# Patient Record
Sex: Female | Born: 1940 | Race: White | Hispanic: No | Smoking: Never smoker
Health system: Southern US, Community
[De-identification: ages and names within clinical notes are randomized; demographics above are authoritative.]

## PROBLEM LIST (undated history)

## (undated) DIAGNOSIS — F32A Depression, unspecified: Secondary | ICD-10-CM

## (undated) DIAGNOSIS — E7800 Pure hypercholesterolemia, unspecified: Secondary | ICD-10-CM

## (undated) DIAGNOSIS — M1990 Unspecified osteoarthritis, unspecified site: Secondary | ICD-10-CM

## (undated) DIAGNOSIS — G4730 Sleep apnea, unspecified: Secondary | ICD-10-CM

## (undated) DIAGNOSIS — G9332 Myalgic encephalomyelitis/chronic fatigue syndrome: Secondary | ICD-10-CM

## (undated) DIAGNOSIS — F419 Anxiety disorder, unspecified: Secondary | ICD-10-CM

## (undated) DIAGNOSIS — E669 Obesity, unspecified: Secondary | ICD-10-CM

## (undated) DIAGNOSIS — K219 Gastro-esophageal reflux disease without esophagitis: Secondary | ICD-10-CM

## (undated) DIAGNOSIS — J329 Chronic sinusitis, unspecified: Secondary | ICD-10-CM

## (undated) DIAGNOSIS — M797 Fibromyalgia: Secondary | ICD-10-CM

## (undated) DIAGNOSIS — R5382 Chronic fatigue, unspecified: Secondary | ICD-10-CM

## (undated) DIAGNOSIS — I82409 Acute embolism and thrombosis of unspecified deep veins of unspecified lower extremity: Secondary | ICD-10-CM

## (undated) DIAGNOSIS — I10 Essential (primary) hypertension: Secondary | ICD-10-CM

## (undated) DIAGNOSIS — R609 Edema, unspecified: Secondary | ICD-10-CM

## (undated) DIAGNOSIS — C569 Malignant neoplasm of unspecified ovary: Secondary | ICD-10-CM

## (undated) DIAGNOSIS — F329 Major depressive disorder, single episode, unspecified: Secondary | ICD-10-CM

## (undated) DIAGNOSIS — R600 Localized edema: Secondary | ICD-10-CM

## (undated) HISTORY — DX: Gastro-esophageal reflux disease without esophagitis: K21.9

## (undated) HISTORY — DX: Major depressive disorder, single episode, unspecified: F32.9

## (undated) HISTORY — DX: Essential (primary) hypertension: I10

## (undated) HISTORY — PX: ROTATOR CUFF REPAIR: SHX139

## (undated) HISTORY — DX: Malignant neoplasm of unspecified ovary: C56.9

## (undated) HISTORY — DX: Chronic sinusitis, unspecified: J32.9

## (undated) HISTORY — PX: REPLACEMENT TOTAL KNEE: SUR1224

## (undated) HISTORY — PX: BACK SURGERY: SHX140

## (undated) HISTORY — PX: CATARACT EXTRACTION W/ INTRAOCULAR LENS & ANTERIOR VITRECTOMY: SHX1308

## (undated) HISTORY — PX: ABDOMINAL HYSTERECTOMY: SHX81

## (undated) HISTORY — PX: TONSILLECTOMY: SUR1361

## (undated) HISTORY — DX: Sleep apnea, unspecified: G47.30

## (undated) HISTORY — PX: LUMBAR DISC SURGERY: SHX700

## (undated) HISTORY — DX: Obesity, unspecified: E66.9

## (undated) HISTORY — DX: Unspecified osteoarthritis, unspecified site: M19.90

## (undated) HISTORY — PX: VEIN LIGATION AND STRIPPING: SHX2653

---

## 1998-01-19 ENCOUNTER — Encounter: Admission: RE | Admit: 1998-01-19 | Discharge: 1998-04-19 | Payer: Self-pay | Admitting: Family Medicine

## 1998-10-07 ENCOUNTER — Encounter: Admission: RE | Admit: 1998-10-07 | Discharge: 1998-12-24 | Payer: Self-pay | Admitting: Anesthesiology

## 1998-11-20 ENCOUNTER — Encounter: Admission: RE | Admit: 1998-11-20 | Discharge: 1999-02-18 | Payer: Self-pay | Admitting: Family Medicine

## 1998-12-24 ENCOUNTER — Encounter: Admission: RE | Admit: 1998-12-24 | Discharge: 1999-03-24 | Payer: Self-pay | Admitting: Anesthesiology

## 1999-04-15 ENCOUNTER — Encounter: Admission: RE | Admit: 1999-04-15 | Discharge: 1999-07-14 | Payer: Self-pay | Admitting: Anesthesiology

## 1999-08-05 ENCOUNTER — Encounter: Admission: RE | Admit: 1999-08-05 | Discharge: 1999-09-30 | Payer: Self-pay | Admitting: Anesthesiology

## 1999-09-29 ENCOUNTER — Encounter: Payer: Self-pay | Admitting: Family Medicine

## 1999-09-29 ENCOUNTER — Ambulatory Visit (HOSPITAL_COMMUNITY): Admission: RE | Admit: 1999-09-29 | Discharge: 1999-09-29 | Payer: Self-pay | Admitting: Family Medicine

## 1999-09-30 ENCOUNTER — Encounter: Admission: RE | Admit: 1999-09-30 | Discharge: 1999-12-23 | Payer: Self-pay | Admitting: Anesthesiology

## 1999-12-23 ENCOUNTER — Encounter: Admission: RE | Admit: 1999-12-23 | Discharge: 1999-12-31 | Payer: Self-pay | Admitting: Anesthesiology

## 2000-02-10 ENCOUNTER — Ambulatory Visit (HOSPITAL_COMMUNITY): Admission: RE | Admit: 2000-02-10 | Discharge: 2000-02-10 | Payer: Self-pay | Admitting: Family Medicine

## 2000-02-17 ENCOUNTER — Encounter: Admission: RE | Admit: 2000-02-17 | Discharge: 2000-04-24 | Payer: Self-pay | Admitting: Anesthesiology

## 2000-06-07 ENCOUNTER — Encounter: Admission: RE | Admit: 2000-06-07 | Discharge: 2000-09-05 | Payer: Self-pay | Admitting: Anesthesiology

## 2000-07-06 ENCOUNTER — Ambulatory Visit (HOSPITAL_COMMUNITY): Admission: RE | Admit: 2000-07-06 | Discharge: 2000-07-06 | Payer: Self-pay | Admitting: Gastroenterology

## 2000-09-28 ENCOUNTER — Encounter: Admission: RE | Admit: 2000-09-28 | Discharge: 2000-12-27 | Payer: Self-pay | Admitting: Anesthesiology

## 2001-01-24 ENCOUNTER — Encounter: Admission: RE | Admit: 2001-01-24 | Discharge: 2001-04-24 | Payer: Self-pay | Admitting: Anesthesiology

## 2001-04-17 ENCOUNTER — Encounter: Payer: Self-pay | Admitting: Family Medicine

## 2001-04-17 ENCOUNTER — Ambulatory Visit (HOSPITAL_COMMUNITY): Admission: RE | Admit: 2001-04-17 | Discharge: 2001-04-17 | Payer: Self-pay | Admitting: Family Medicine

## 2001-04-30 ENCOUNTER — Encounter: Payer: Self-pay | Admitting: Family Medicine

## 2001-04-30 ENCOUNTER — Encounter: Admission: RE | Admit: 2001-04-30 | Discharge: 2001-04-30 | Payer: Self-pay | Admitting: Family Medicine

## 2001-05-14 ENCOUNTER — Encounter: Admission: RE | Admit: 2001-05-14 | Discharge: 2001-06-09 | Payer: Self-pay | Admitting: Anesthesiology

## 2002-02-18 ENCOUNTER — Encounter: Payer: Self-pay | Admitting: Family Medicine

## 2002-02-18 ENCOUNTER — Encounter: Admission: RE | Admit: 2002-02-18 | Discharge: 2002-02-18 | Payer: Self-pay | Admitting: Family Medicine

## 2002-07-29 ENCOUNTER — Ambulatory Visit (HOSPITAL_COMMUNITY): Admission: RE | Admit: 2002-07-29 | Discharge: 2002-07-29 | Payer: Self-pay | Admitting: Family Medicine

## 2002-07-29 ENCOUNTER — Encounter: Payer: Self-pay | Admitting: Family Medicine

## 2002-08-05 ENCOUNTER — Encounter: Payer: Self-pay | Admitting: Specialist

## 2002-08-12 ENCOUNTER — Inpatient Hospital Stay (HOSPITAL_COMMUNITY): Admission: RE | Admit: 2002-08-12 | Discharge: 2002-08-17 | Payer: Self-pay | Admitting: Specialist

## 2002-08-12 ENCOUNTER — Encounter: Payer: Self-pay | Admitting: Specialist

## 2002-09-17 ENCOUNTER — Encounter (HOSPITAL_COMMUNITY): Admission: RE | Admit: 2002-09-17 | Discharge: 2002-10-17 | Payer: Self-pay | Admitting: Specialist

## 2002-10-22 ENCOUNTER — Encounter (HOSPITAL_COMMUNITY): Admission: RE | Admit: 2002-10-22 | Discharge: 2002-11-21 | Payer: Self-pay | Admitting: Specialist

## 2003-07-31 ENCOUNTER — Encounter: Payer: Self-pay | Admitting: Family Medicine

## 2003-07-31 ENCOUNTER — Ambulatory Visit (HOSPITAL_COMMUNITY): Admission: RE | Admit: 2003-07-31 | Discharge: 2003-07-31 | Payer: Self-pay | Admitting: Family Medicine

## 2003-08-22 ENCOUNTER — Ambulatory Visit (HOSPITAL_COMMUNITY): Admission: RE | Admit: 2003-08-22 | Discharge: 2003-08-22 | Payer: Self-pay | Admitting: Orthopedic Surgery

## 2003-09-24 ENCOUNTER — Observation Stay (HOSPITAL_COMMUNITY): Admission: RE | Admit: 2003-09-24 | Discharge: 2003-09-25 | Payer: Self-pay | Admitting: Orthopedic Surgery

## 2003-10-01 ENCOUNTER — Encounter (HOSPITAL_COMMUNITY): Admission: RE | Admit: 2003-10-01 | Discharge: 2003-10-31 | Payer: Self-pay | Admitting: Orthopedic Surgery

## 2003-10-13 ENCOUNTER — Encounter (HOSPITAL_COMMUNITY): Admission: RE | Admit: 2003-10-13 | Discharge: 2003-11-12 | Payer: Self-pay | Admitting: Orthopedic Surgery

## 2003-11-14 ENCOUNTER — Encounter (HOSPITAL_COMMUNITY): Admission: RE | Admit: 2003-11-14 | Discharge: 2003-12-19 | Payer: Self-pay | Admitting: Orthopedic Surgery

## 2004-02-12 ENCOUNTER — Encounter (INDEPENDENT_AMBULATORY_CARE_PROVIDER_SITE_OTHER): Payer: Self-pay | Admitting: *Deleted

## 2004-02-12 ENCOUNTER — Ambulatory Visit (HOSPITAL_COMMUNITY): Admission: RE | Admit: 2004-02-12 | Discharge: 2004-02-12 | Payer: Self-pay | Admitting: Surgery

## 2004-05-18 ENCOUNTER — Encounter: Admission: RE | Admit: 2004-05-18 | Discharge: 2004-05-18 | Payer: Self-pay | Admitting: Orthopedic Surgery

## 2004-08-09 ENCOUNTER — Ambulatory Visit (HOSPITAL_COMMUNITY): Admission: RE | Admit: 2004-08-09 | Discharge: 2004-08-09 | Payer: Self-pay | Admitting: Family Medicine

## 2005-08-10 ENCOUNTER — Ambulatory Visit (HOSPITAL_COMMUNITY): Admission: RE | Admit: 2005-08-10 | Discharge: 2005-08-10 | Payer: Self-pay | Admitting: *Deleted

## 2005-09-19 ENCOUNTER — Encounter (INDEPENDENT_AMBULATORY_CARE_PROVIDER_SITE_OTHER): Payer: Self-pay | Admitting: *Deleted

## 2005-09-19 ENCOUNTER — Encounter: Admission: RE | Admit: 2005-09-19 | Discharge: 2005-09-19 | Payer: Self-pay | Admitting: Obstetrics and Gynecology

## 2005-09-19 ENCOUNTER — Other Ambulatory Visit: Admission: RE | Admit: 2005-09-19 | Discharge: 2005-09-19 | Payer: Self-pay | Admitting: Interventional Radiology

## 2006-08-11 ENCOUNTER — Ambulatory Visit (HOSPITAL_COMMUNITY): Admission: RE | Admit: 2006-08-11 | Discharge: 2006-08-11 | Payer: Self-pay | Admitting: Family Medicine

## 2007-08-13 ENCOUNTER — Ambulatory Visit (HOSPITAL_COMMUNITY): Admission: RE | Admit: 2007-08-13 | Discharge: 2007-08-13 | Payer: Self-pay | Admitting: Family Medicine

## 2008-06-05 ENCOUNTER — Encounter: Admission: RE | Admit: 2008-06-05 | Discharge: 2008-06-05 | Payer: Self-pay | Admitting: Surgery

## 2009-05-11 ENCOUNTER — Inpatient Hospital Stay (HOSPITAL_COMMUNITY): Admission: RE | Admit: 2009-05-11 | Discharge: 2009-05-14 | Payer: Self-pay | Admitting: Orthopedic Surgery

## 2009-05-20 ENCOUNTER — Encounter (INDEPENDENT_AMBULATORY_CARE_PROVIDER_SITE_OTHER): Payer: Self-pay | Admitting: Orthopedic Surgery

## 2009-05-20 ENCOUNTER — Ambulatory Visit: Payer: Self-pay | Admitting: Vascular Surgery

## 2009-05-20 ENCOUNTER — Ambulatory Visit (HOSPITAL_COMMUNITY): Admission: RE | Admit: 2009-05-20 | Discharge: 2009-05-20 | Payer: Self-pay | Admitting: Orthopedic Surgery

## 2009-06-10 ENCOUNTER — Encounter (HOSPITAL_COMMUNITY): Admission: RE | Admit: 2009-06-10 | Discharge: 2009-07-09 | Payer: Self-pay | Admitting: Orthopedic Surgery

## 2009-07-10 ENCOUNTER — Encounter (HOSPITAL_COMMUNITY): Admission: RE | Admit: 2009-07-10 | Discharge: 2009-08-09 | Payer: Self-pay | Admitting: Orthopedic Surgery

## 2009-07-29 ENCOUNTER — Encounter (HOSPITAL_COMMUNITY)
Admission: RE | Admit: 2009-07-29 | Discharge: 2009-08-28 | Payer: Self-pay | Admitting: Physical Medicine and Rehabilitation

## 2009-08-16 ENCOUNTER — Encounter
Admission: RE | Admit: 2009-08-16 | Discharge: 2009-08-16 | Payer: Self-pay | Admitting: Physical Medicine and Rehabilitation

## 2009-09-01 ENCOUNTER — Encounter (HOSPITAL_COMMUNITY)
Admission: RE | Admit: 2009-09-01 | Discharge: 2009-10-01 | Payer: Self-pay | Admitting: Physical Medicine and Rehabilitation

## 2010-02-22 ENCOUNTER — Ambulatory Visit (HOSPITAL_COMMUNITY): Admission: RE | Admit: 2010-02-22 | Discharge: 2010-02-22 | Payer: Self-pay | Admitting: Psychiatry

## 2010-07-20 ENCOUNTER — Ambulatory Visit (HOSPITAL_COMMUNITY): Admission: RE | Admit: 2010-07-20 | Discharge: 2010-07-20 | Payer: Self-pay | Admitting: Orthopedic Surgery

## 2010-08-03 ENCOUNTER — Emergency Department (HOSPITAL_COMMUNITY): Admission: EM | Admit: 2010-08-03 | Discharge: 2010-08-03 | Payer: Self-pay | Admitting: Emergency Medicine

## 2010-09-24 ENCOUNTER — Inpatient Hospital Stay (HOSPITAL_COMMUNITY)
Admission: RE | Admit: 2010-09-24 | Discharge: 2010-09-27 | Payer: Self-pay | Source: Home / Self Care | Attending: Orthopedic Surgery | Admitting: Orthopedic Surgery

## 2010-10-30 ENCOUNTER — Encounter: Payer: Self-pay | Admitting: Surgery

## 2010-10-31 ENCOUNTER — Encounter: Payer: Self-pay | Admitting: Family Medicine

## 2010-12-20 LAB — CBC
HCT: 27.8 % — ABNORMAL LOW (ref 36.0–46.0)
HCT: 31.1 % — ABNORMAL LOW (ref 36.0–46.0)
HCT: 33.7 % — ABNORMAL LOW (ref 36.0–46.0)
Hemoglobin: 10 g/dL — ABNORMAL LOW (ref 12.0–15.0)
Hemoglobin: 10.8 g/dL — ABNORMAL LOW (ref 12.0–15.0)
Hemoglobin: 9.1 g/dL — ABNORMAL LOW (ref 12.0–15.0)
MCH: 28.6 pg (ref 26.0–34.0)
MCH: 28.9 pg (ref 26.0–34.0)
MCH: 29.3 pg (ref 26.0–34.0)
MCHC: 32 g/dL (ref 30.0–36.0)
MCHC: 32.2 g/dL (ref 30.0–36.0)
MCHC: 32.7 g/dL (ref 30.0–36.0)
MCV: 89.2 fL (ref 78.0–100.0)
MCV: 89.4 fL (ref 78.0–100.0)
MCV: 89.9 fL (ref 78.0–100.0)
Platelets: 125 10*3/uL — ABNORMAL LOW (ref 150–400)
Platelets: 130 10*3/uL — ABNORMAL LOW (ref 150–400)
Platelets: 172 10*3/uL (ref 150–400)
RBC: 3.11 MIL/uL — ABNORMAL LOW (ref 3.87–5.11)
RBC: 3.46 MIL/uL — ABNORMAL LOW (ref 3.87–5.11)
RBC: 3.78 MIL/uL — ABNORMAL LOW (ref 3.87–5.11)
RDW: 13.7 % (ref 11.5–15.5)
RDW: 13.8 % (ref 11.5–15.5)
RDW: 13.9 % (ref 11.5–15.5)
WBC: 10.9 10*3/uL — ABNORMAL HIGH (ref 4.0–10.5)
WBC: 11.2 10*3/uL — ABNORMAL HIGH (ref 4.0–10.5)
WBC: 14.8 10*3/uL — ABNORMAL HIGH (ref 4.0–10.5)

## 2010-12-20 LAB — TYPE AND SCREEN
ABO/RH(D): O POS
Antibody Screen: NEGATIVE

## 2010-12-20 LAB — BASIC METABOLIC PANEL
BUN: 10 mg/dL (ref 6–23)
BUN: 5 mg/dL — ABNORMAL LOW (ref 6–23)
CO2: 30 mEq/L (ref 19–32)
CO2: 30 mEq/L (ref 19–32)
Calcium: 8.4 mg/dL (ref 8.4–10.5)
Calcium: 8.7 mg/dL (ref 8.4–10.5)
Chloride: 99 mEq/L (ref 96–112)
Chloride: 99 mEq/L (ref 96–112)
Creatinine, Ser: 0.62 mg/dL (ref 0.4–1.2)
Creatinine, Ser: 0.63 mg/dL (ref 0.4–1.2)
GFR calc Af Amer: >60 mL/min (ref >60)
GFR calc Af Amer: >60 mL/min (ref >60)
GFR calc non Af Amer: >60 mL/min (ref >60)
GFR calc non Af Amer: >60 mL/min (ref >60)
Glucose, Bld: 200 mg/dL — ABNORMAL HIGH (ref 70–99)
Glucose, Bld: 225 mg/dL — ABNORMAL HIGH (ref 70–99)
Potassium: 3.8 mEq/L (ref 3.5–5.1)
Potassium: 4.2 mEq/L (ref 3.5–5.1)
Sodium: 135 mEq/L (ref 135–145)
Sodium: 136 mEq/L (ref 135–145)

## 2010-12-20 LAB — GLUCOSE, CAPILLARY
Glucose-Capillary: 141 mg/dL — ABNORMAL HIGH (ref 70–99)
Glucose-Capillary: 169 mg/dL — ABNORMAL HIGH (ref 70–99)
Glucose-Capillary: 177 mg/dL — ABNORMAL HIGH (ref 70–99)
Glucose-Capillary: 180 mg/dL — ABNORMAL HIGH (ref 70–99)
Glucose-Capillary: 184 mg/dL — ABNORMAL HIGH (ref 70–99)
Glucose-Capillary: 189 mg/dL — ABNORMAL HIGH (ref 70–99)
Glucose-Capillary: 189 mg/dL — ABNORMAL HIGH (ref 70–99)
Glucose-Capillary: 194 mg/dL — ABNORMAL HIGH (ref 70–99)
Glucose-Capillary: 200 mg/dL — ABNORMAL HIGH (ref 70–99)
Glucose-Capillary: 205 mg/dL — ABNORMAL HIGH (ref 70–99)
Glucose-Capillary: 211 mg/dL — ABNORMAL HIGH (ref 70–99)
Glucose-Capillary: 219 mg/dL — ABNORMAL HIGH (ref 70–99)
Glucose-Capillary: 222 mg/dL — ABNORMAL HIGH (ref 70–99)
Glucose-Capillary: 224 mg/dL — ABNORMAL HIGH (ref 70–99)

## 2010-12-20 LAB — APTT: aPTT: 36 seconds (ref 24–37)

## 2010-12-20 LAB — PROTIME-INR
INR: 1.04 (ref 0.00–1.49)
INR: 1.19 (ref 0.00–1.49)
INR: 1.41 (ref 0.00–1.49)
INR: 1.73 — ABNORMAL HIGH (ref 0.00–1.49)
Prothrombin Time: 13.8 seconds (ref 11.6–15.2)
Prothrombin Time: 15.3 seconds — ABNORMAL HIGH (ref 11.6–15.2)
Prothrombin Time: 17.5 seconds — ABNORMAL HIGH (ref 11.6–15.2)
Prothrombin Time: 20.4 seconds — ABNORMAL HIGH (ref 11.6–15.2)

## 2010-12-21 LAB — CBC
HCT: 46.8 % — ABNORMAL HIGH (ref 36.0–46.0)
Hemoglobin: 15.1 g/dL — ABNORMAL HIGH (ref 12.0–15.0)
MCH: 29.1 pg (ref 26.0–34.0)
MCHC: 32.3 g/dL (ref 30.0–36.0)
MCV: 90.2 fL (ref 78.0–100.0)
Platelets: 218 10*3/uL (ref 150–400)
RBC: 5.19 MIL/uL — ABNORMAL HIGH (ref 3.87–5.11)
RDW: 13.6 % (ref 11.5–15.5)
WBC: 10 10*3/uL (ref 4.0–10.5)

## 2010-12-21 LAB — SURGICAL PCR SCREEN
MRSA, PCR: NEGATIVE
Staphylococcus aureus: NEGATIVE

## 2010-12-21 LAB — COMPREHENSIVE METABOLIC PANEL
ALT: 17 U/L (ref 0–35)
AST: 20 U/L (ref 0–37)
Albumin: 4.5 g/dL (ref 3.5–5.2)
Alkaline Phosphatase: 92 U/L (ref 39–117)
BUN: 12 mg/dL (ref 6–23)
CO2: 29 mEq/L (ref 19–32)
Calcium: 9.8 mg/dL (ref 8.4–10.5)
Chloride: 96 mEq/L (ref 96–112)
Creatinine, Ser: 0.69 mg/dL (ref 0.4–1.2)
GFR calc Af Amer: >60 mL/min (ref >60)
GFR calc non Af Amer: >60 mL/min (ref >60)
Glucose, Bld: 144 mg/dL — ABNORMAL HIGH (ref 70–99)
Potassium: 4.7 mEq/L (ref 3.5–5.1)
Sodium: 141 mEq/L (ref 135–145)
Total Bilirubin: 0.8 mg/dL (ref 0.3–1.2)
Total Protein: 7.5 g/dL (ref 6.0–8.3)

## 2010-12-21 LAB — URINALYSIS, ROUTINE W REFLEX MICROSCOPIC
Bilirubin Urine: NEGATIVE
Glucose, UA: NEGATIVE mg/dL
Hgb urine dipstick: NEGATIVE
Ketones, ur: NEGATIVE mg/dL
Nitrite: NEGATIVE
Protein, ur: NEGATIVE mg/dL
Specific Gravity, Urine: 1.03 (ref 1.005–1.030)
Urobilinogen, UA: 0.2 mg/dL (ref 0.0–1.0)
pH: 6 (ref 5.0–8.0)

## 2010-12-21 LAB — PROTIME-INR
INR: 1.75 — ABNORMAL HIGH (ref 0.00–1.49)
Prothrombin Time: 20.6 seconds — ABNORMAL HIGH (ref 11.6–15.2)

## 2010-12-21 LAB — APTT: aPTT: 39 seconds — ABNORMAL HIGH (ref 24–37)

## 2011-01-16 LAB — GLUCOSE, CAPILLARY
Glucose-Capillary: 107 mg/dL — ABNORMAL HIGH (ref 70–99)
Glucose-Capillary: 117 mg/dL — ABNORMAL HIGH (ref 70–99)
Glucose-Capillary: 151 mg/dL — ABNORMAL HIGH (ref 70–99)
Glucose-Capillary: 153 mg/dL — ABNORMAL HIGH (ref 70–99)
Glucose-Capillary: 155 mg/dL — ABNORMAL HIGH (ref 70–99)
Glucose-Capillary: 158 mg/dL — ABNORMAL HIGH (ref 70–99)
Glucose-Capillary: 164 mg/dL — ABNORMAL HIGH (ref 70–99)
Glucose-Capillary: 166 mg/dL — ABNORMAL HIGH (ref 70–99)
Glucose-Capillary: 176 mg/dL — ABNORMAL HIGH (ref 70–99)
Glucose-Capillary: 183 mg/dL — ABNORMAL HIGH (ref 70–99)
Glucose-Capillary: 183 mg/dL — ABNORMAL HIGH (ref 70–99)
Glucose-Capillary: 189 mg/dL — ABNORMAL HIGH (ref 70–99)
Glucose-Capillary: 194 mg/dL — ABNORMAL HIGH (ref 70–99)
Glucose-Capillary: 202 mg/dL — ABNORMAL HIGH (ref 70–99)
Glucose-Capillary: 209 mg/dL — ABNORMAL HIGH (ref 70–99)

## 2011-01-16 LAB — URINALYSIS, ROUTINE W REFLEX MICROSCOPIC
Bilirubin Urine: NEGATIVE
Glucose, UA: NEGATIVE mg/dL
Hgb urine dipstick: NEGATIVE
Ketones, ur: NEGATIVE mg/dL
Nitrite: NEGATIVE
Protein, ur: NEGATIVE mg/dL
Specific Gravity, Urine: 1.025 (ref 1.005–1.030)
Urobilinogen, UA: 0.2 mg/dL (ref 0.0–1.0)
pH: 5 (ref 5.0–8.0)

## 2011-01-16 LAB — COMPREHENSIVE METABOLIC PANEL
ALT: 15 U/L (ref 0–35)
AST: 16 U/L (ref 0–37)
Albumin: 4.2 g/dL (ref 3.5–5.2)
Alkaline Phosphatase: 70 U/L (ref 39–117)
BUN: 15 mg/dL (ref 6–23)
CO2: 31 mEq/L (ref 19–32)
Calcium: 9.7 mg/dL (ref 8.4–10.5)
Chloride: 105 mEq/L (ref 96–112)
Creatinine, Ser: 0.65 mg/dL (ref 0.4–1.2)
GFR calc Af Amer: >60 mL/min (ref >60)
GFR calc non Af Amer: >60 mL/min (ref >60)
Glucose, Bld: 120 mg/dL — ABNORMAL HIGH (ref 70–99)
Potassium: 3.9 mEq/L (ref 3.5–5.1)
Sodium: 143 mEq/L (ref 135–145)
Total Bilirubin: 0.6 mg/dL (ref 0.3–1.2)
Total Protein: 7 g/dL (ref 6.0–8.3)

## 2011-01-16 LAB — CBC
HCT: 29.5 % — ABNORMAL LOW (ref 36.0–46.0)
HCT: 33.2 % — ABNORMAL LOW (ref 36.0–46.0)
HCT: 33.7 % — ABNORMAL LOW (ref 36.0–46.0)
HCT: 41 % (ref 36.0–46.0)
Hemoglobin: 10.2 g/dL — ABNORMAL LOW (ref 12.0–15.0)
Hemoglobin: 11.2 g/dL — ABNORMAL LOW (ref 12.0–15.0)
Hemoglobin: 11.5 g/dL — ABNORMAL LOW (ref 12.0–15.0)
Hemoglobin: 13.9 g/dL (ref 12.0–15.0)
MCHC: 33.8 g/dL (ref 30.0–36.0)
MCHC: 33.8 g/dL (ref 30.0–36.0)
MCHC: 34 g/dL (ref 30.0–36.0)
MCHC: 34.6 g/dL (ref 30.0–36.0)
MCV: 86.5 fL (ref 78.0–100.0)
MCV: 86.8 fL (ref 78.0–100.0)
MCV: 87.1 fL (ref 78.0–100.0)
MCV: 87.8 fL (ref 78.0–100.0)
Platelets: 144 10*3/uL — ABNORMAL LOW (ref 150–400)
Platelets: 153 10*3/uL (ref 150–400)
Platelets: 185 10*3/uL (ref 150–400)
Platelets: 213 10*3/uL (ref 150–400)
RBC: 3.39 MIL/uL — ABNORMAL LOW (ref 3.87–5.11)
RBC: 3.78 MIL/uL — ABNORMAL LOW (ref 3.87–5.11)
RBC: 3.89 MIL/uL (ref 3.87–5.11)
RBC: 4.73 MIL/uL (ref 3.87–5.11)
RDW: 13.3 % (ref 11.5–15.5)
RDW: 13.6 % (ref 11.5–15.5)
RDW: 13.6 % (ref 11.5–15.5)
RDW: 13.8 % (ref 11.5–15.5)
WBC: 10.7 10*3/uL — ABNORMAL HIGH (ref 4.0–10.5)
WBC: 7.9 10*3/uL (ref 4.0–10.5)
WBC: 8.1 10*3/uL (ref 4.0–10.5)
WBC: 8.6 10*3/uL (ref 4.0–10.5)

## 2011-01-16 LAB — BASIC METABOLIC PANEL
BUN: 5 mg/dL — ABNORMAL LOW (ref 6–23)
BUN: 9 mg/dL (ref 6–23)
CO2: 32 mEq/L (ref 19–32)
CO2: 32 mEq/L (ref 19–32)
Calcium: 8.5 mg/dL (ref 8.4–10.5)
Calcium: 8.5 mg/dL (ref 8.4–10.5)
Chloride: 101 mEq/L (ref 96–112)
Chloride: 103 mEq/L (ref 96–112)
Creatinine, Ser: 0.57 mg/dL (ref 0.4–1.2)
Creatinine, Ser: 0.63 mg/dL (ref 0.4–1.2)
GFR calc Af Amer: >60 mL/min (ref >60)
GFR calc Af Amer: >60 mL/min (ref >60)
GFR calc non Af Amer: >60 mL/min (ref >60)
GFR calc non Af Amer: >60 mL/min (ref >60)
Glucose, Bld: 175 mg/dL — ABNORMAL HIGH (ref 70–99)
Glucose, Bld: 194 mg/dL — ABNORMAL HIGH (ref 70–99)
Potassium: 4.1 mEq/L (ref 3.5–5.1)
Potassium: 4.4 mEq/L (ref 3.5–5.1)
Sodium: 137 mEq/L (ref 135–145)
Sodium: 138 mEq/L (ref 135–145)

## 2011-01-16 LAB — URINE MICROSCOPIC-ADD ON

## 2011-01-16 LAB — TYPE AND SCREEN
ABO/RH(D): O POS
Antibody Screen: NEGATIVE

## 2011-01-16 LAB — PROTIME-INR
INR: 1 (ref 0.00–1.49)
INR: 1.1 (ref 0.00–1.49)
INR: 1.3 (ref 0.00–1.49)
INR: 1.5 (ref 0.00–1.49)
Prothrombin Time: 13.6 seconds (ref 11.6–15.2)
Prothrombin Time: 14 seconds (ref 11.6–15.2)
Prothrombin Time: 16.1 seconds — ABNORMAL HIGH (ref 11.6–15.2)
Prothrombin Time: 18.4 seconds — ABNORMAL HIGH (ref 11.6–15.2)

## 2011-01-16 LAB — ABO/RH: ABO/RH(D): O POS

## 2011-01-16 LAB — APTT: aPTT: 51 seconds — ABNORMAL HIGH (ref 24–37)

## 2011-02-22 NOTE — Op Note (Signed)
NAMESARH, KIRSCHENBAUM              ACCOUNT NO.:  [id number]   MEDICAL RECORD NO.:  [id number]          PATIENT TYPE:  INP   LOCATION:  0009                         FACILITY:  [facility]   PHYSICIAN:  Ollen Gross, M.D.    DATE OF BIRTH:  [birth year]   DATE OF PROCEDURE:  05/11/2009  DATE OF DISCHARGE:                               OPERATIVE REPORT   PROCEDURE:  Right total knee arthroplasty.   SURGEON:  Ollen Gross, M.D.   ASSISTANT:  Avel Peace, PA-C   ANESTHESIA:  General with postop Marcaine pain pump.   ESTIMATED BLOOD LOSS:  Minimal.   DRAINS:  None.   TOURNIQUET TIME:  35 minutes at 300 mmHg.   COMPLICATIONS:  None.   CONDITION:  Stable.   INDICATIONS:  [name] is a 70 year old female with end-stage  arthritis of the right knee with progressively worsening pain and  dysfunction.  She had extensive nonoperative management but now has  developed intractable worsening pain and presents for total knee  arthroplasty.   PROCEDURE IN DETAIL:  After successful administration of general  anesthetic, a tourniquet was placed on the right thigh and right lower  extremity, prepped and draped in the usual sterile fashion.  Extremities  wrapped in Esmarch, knee flexed, tourniquet inflated to 300 mmHg.  Midline incision made with a 10 blade through subcutaneous tissue to the  level of the extensor mechanism.  A fresh blade is used to make a medial  parapatellar arthrotomy.  Soft tissue of the proximal medial tibia  subperiosteally elevated to joint line with the knife into the  semimembranosus bursa with a Cobb elevator.  Soft tissue laterally is  elevated with attention being paid to avoid patellar tendon or tibial  tubercle.  Patella subluxed laterally, knee flexed 90 degrees, ACL and  PCL removed.  A drill was used to create a starting hole in the distal  femur and canal was thoroughly irrigated.  A 5-degree right valgus  alignment guide is placed referencing off posterior  condyles, rotations  marked and the block pinned to remove 10 mm off the distal femur.  Distal femoral resection is made with an oscillating saw.  Sizing blocks  placed and size 3 is most appropriate.  Rotations marked at the  epicondylar axis.  Size 3 cutting blocks placed and the anterior,  posterior and chamfer cuts made.   Tibia subluxed forward and the menisci removed.  Extramedullary tibial  alignment guide is placed, referencing proximally at the medial aspect  of the tibial tubercle and distally along the second metatarsal axis and  tibial crest.  Blocks pinned to remove about 4 mm off the more deficient  lateral side.  Tibial resection is made with an oscillating saw.  Size 3  is most appropriate tibial component and the proximal tibia prepared  with the modular drill and keel punch for the size 3.  Femoral  preparation is completed with the intercondylar cut.   Size 3 mobile bearing tibial trial, size 3 posterior stabilized femoral  trial and 10 mm posterior stabilized rotating platform insert trial are  placed.  Full  extensions achieved with excellent varus-valgus and  anterior-posterior balance throughout full range of motion.  Patella was  everted and thickness was measured to be 22 mm.  Freehand resection is  taken to 12 mm.  A 35 template is placed, lug holes were drilled, trial  patella was placed and it tracks normally.  Osteophytes removed off the  posterior femur with the trial in place.  All trials removed and the cut  bone surfaces prepared with pulsatile lavage.  Cement was mixed and once  ready for implantation, the size 3 mobile bearing tibial tray, size 3  posterior stabilized femur and 35 patella are cemented into place.  Patella was held with clamp.  Trial 10-mm inserts placed, knee held in  full extension and all extruded cement removed.  The cement fully  hardened, then the permanent 10 mm posterior stabilized rotating  platform insert is placed into the  tibial tray.  The wound is copiously  irrigated with saline solution and then the FloSeal injected in the  medial and lateral gutters and suprapatellar area.  Moist sponge is  placed and the tourniquet is released for total time of 35 minutes.  The  sponges held for 2 minutes and removed.  Minimal bleeding was  encountered.  Bleeding that is encountered stopped with electrocautery.  Wounds copiously irrigated with saline solution and then the arthrotomy  closed with interrupted #1 PDS.  Flexion against gravity 130 degrees.  Subcu was closed interrupted 2-0 Vicryl and subcuticular running 4-0  Monocryl.  Catheter for Marcaine pain pump was placed the pump  initiated.  Steri-Strips and a bulky sterile dressing were applied and  she is then placed into a knee immobilizer, awakened and transferred to  recovery in stable condition.      Ollen Gross, M.D.  Electronically Signed     FA/MEDQ  D:  05/11/2009  T:  05/11/2009  Job:  [id number]

## 2011-02-25 NOTE — Procedures (Signed)
[facility]  Patient:    Carolyn Lambert, Carolyn Lambert                     MRN: [id number] Proc. Date: 07/06/00 Adm. Date:  [id number] Attending:  Orland Mustard CC:         Delorse Lek, M.D.                           Procedure Report  PROCEDURE:  Colonoscopy.  MEDICATIONS:  Fentanyl 125 mcg, Versed 10 mg IV.  SCOPE:  Olympus adult video colonoscope.  INDICATIONS:  Rectal bleeding, vague right-sided pain.  DESCRIPTION OF PROCEDURE:  The procedure had been explained to the patient and consent obtained.  With the patient in the left lateral decubitus position a digital examination was performed, and the Olympus adult video colonoscope was inserted and advanced under direct visualization.  The prep was quite good and we were able to advance over to the cecum.  The right lower quadrant was transilluminated.  The ileocecal valve was seen and I could only enter it for a short distance.  The scope was withdrawn.  The cecum, ascending colon, hepatic flexure, transverse colon, splenic flexure, descending and sigmoid colon were seen well upon removal.  No polyps or other lesions seen.  No significant diverticular disease.  Internal hemorrhoids were seen in the rectum upon removal of the scope.  The scope was withdrawn.  The patient tolerated the procedure well and was maintained on low-flow oxygen and pulse oximetry.  Throughout the procedure there were no obvious problem.  ASSESSMENT:  Rectal bleeding probably due to internal hemorrhoids.  PLAN:  We will treat for hemorrhoids and see back in our office in four to six weeks. DD:  07/06/00 TD:  07/06/00 Job: [id number]

## 2011-02-25 NOTE — Discharge Summary (Signed)
NAME:  Carolyn Lambert, Carolyn Lambert                        ACCOUNT NO.:  [id number]   MEDICAL RECORD NO.:  [id number]                   PATIENT TYPE:  INP   LOCATION:  0473                                 FACILITY:  [facility]   PHYSICIAN:  Ronnell Guadalajara, M.D.                DATE OF BIRTH:  16-Jan-1941   DATE OF ADMISSION:  08/12/2002  DATE OF DISCHARGE:  08/17/2002                                 DISCHARGE SUMMARY   ADMISSION DIAGNOSES:  1. Osteoarthritis left knee.  2. Depression.  3. Anxiety.   DISCHARGE DIAGNOSES:  1. Osteoarthritis of the left knee status post left total knee arthroplasty.  2. Depression.  3. Anxiety.  4. Postoperative anemia.   PROCEDURE:  The patient was taken to the operating room on August 12, 2002  and underwent a left total knee replacement.  The surgeon was Ronnell Guadalajara, M.D., assistant was Marlowe Kays, M.D.  A Hemovac drain x1 was  placed at the time of surgery.  Surgery was done under general anesthesia.   CONSULTATIONS:  1. Carolyn Lambert, M.D. of physical medicine rehabilitation.  2. Physical therapy.  3. Occupational therapy.  4. Pharmacy for Coumadin control.  5. 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   BRIEF HISTORY:  The patient is a 70 year old female with a several-year  history of left knee pain.  She has complaint of her left knee giving away.  She complains of a considerable amount of pain in her left knee and has also  complained of pain at night.  Risks and benefits of the procedure have been  discussed with the patient and the patient wishes to proceed.   LABORATORY AND ACCESSORY DATA:  CBC on admission revealed a hemoglobin and  hematocrit of 14.3 and 41.9, white blood cell count 6.9, red blood cell  count 5.0.  Serial H&H were followed throughout her hospital stay.  Hemoglobin and hematocrit did decline to 7.7 and 22.3 on August 14, 2002.  At the time of discharge on August 17, 2002, hemoglobin and hematocrit were  stable at 9.1 and  26.4 status post packed red blood cell transfusions on  August 14, 2002.  Differential on admission all within normal limits.  Coagulation studies on admission showed PTT slightly high at 40.  PT/INR at  the time of discharge were 19.3 and 1.8 on Coumadin therapy.  Routine  chemistry on admission showed glucose high at 210.  Follow up chemistry on  November 3rd showed glucose high at 166.  Urinalysis on admission showed  appearance was cloudy, leukocyte esterase moderate, many epithelials and a  few bacteria.  The patient's blood type is O positive with antibody  negative.   EKG on admission revealed normal sinus rhythm with a normal ECG.  Preop knee  x-rays revealed marked degenerative changes as noted.  Revealed the left  knee with particular deformity in the medial compartment also the  patellofemoral articulation.  A preop chest x-ray revealed negative chest  for active disease.   HOSPITAL COURSE:  The patient was admitted to [facility] and taken  to the operating room.  She underwent the above-stated procedure without  complication.  The patient tolerated the procedure well and was allowed to  return to the recovery room and then the orthopedic floor in continued  postoperative care.  On postop day #1 on August 13, 2002, the patient was  complaining of pain and a respective hemoglobin and hematocrit of 9.4 and  27.2.  Hemovac was discontinued on this day.  The patient was to be seen by  physical therapy and occupational therapy.  On August 14, 2002 seen by  orthopedics, hemoglobin was 7.7.  The patient was transfused 2 units of  packed red blood cells and pain medicine was increased.  On August 15, 2002  postop day #3, was seen by orthopedics.  Hemoglobin 9.2 hematocrit 26.6.  The patient was seen by physical therapy.  Has a slight slow progression  with physical therapy but otherwise resting comfortably and had some trouble  with her foot staying in a CPM.  On  August 16, 2002 postop day #4, seen by  orthopedics, the patient was doing well, no complaint, was progressing with  physical therapy and occupational therapy.  Plan was discharge home the  following day.  On August 17, 2002 postop day #5, the patient was doing  well and was ready for discharge, and was discharged home on this day.   DISPOSITION:  The patient was discharged home on August 17, 2002.   DISCHARGE MEDICATIONS:  1. OxyContin.  2. Skelaxin.  3. Coumadin per pharmacy.  4. Keflex 500 mg t.i.d. x5 days.   DIET:  As tolerated.   ACTIVITY:  Total knee precautions.  [facility] for home care.  The patient was  partial weightbearing, 50% of body weight.   FOLLOW UP:  The patient is to follow up with Dr. Montez Morita two weeks from the  date of surgery.  She is to call for an appointment.   CONDITION ON DISCHARGE:  Stable, improved.     Carolyn Lambert, P.A.-C.                   Ronnell Guadalajara, M.D.    SW/MEDQ  D:  09/09/2002  T:  09/09/2002  Job:  [id number]

## 2011-02-25 NOTE — Consult Note (Signed)
[facility]  Patient:    Carolyn Lambert, Carolyn Lambert                     MRN: [id number] Proc. Date: 02/17/00 Adm. Date:  [id number] Attending:  Thyra Breed CC:         Delorse Lek, M.D., [facility]                          Consultation Report  FOLLOWUP EVALUATION:  The patient comes in for followup evaluation of her low back on the basis of degenerative disc disease.  Since her last evaluation, she has seen Dr. Montez Morita, who plans to scope her right knee and has told her that she ultimately will likely require knee replacements.  She continues on her OxyContin 40 mg twice a day and has developed more muscle spasms since going off the Baclofen and is interested in going back on this.  Her other medications include Zoloft, Xanax, Wellbutrin, Daypro, Premarin, aspirin and the OxyContin.  She has minimal side-effects to the OxyContin.  PHYSICAL EXAMINATION:  VITAL SIGNS:  Blood pressure 146/67, heart rate 76, respiratory rate 16, O2 saturations 97%, pain level 6/10, which is improved from last visit.  NEUROLOGIC:  Straight leg raise signs are negative.  She has genu valgus of the knees bilaterally and mild crepitus.  Deep tendon reflexes were symmetric in the lower extremities.  IMPRESSION: 1. Low back pain on the basis of degenerative disc disease. 2. Knee pain on the basis of osteoarthritis, for which she is seeing    Dr. Michael Litter. Montez Morita. 3. Other medical problems per Dr. Delorse Lek.  DISPOSITION: 1. Continue on OxyContin 40 mg one p.o. b.i.d., #60 with no refills. 2. Restart Baclofen 10 mg one-half tablet t.i.d. p.r.n., #50 with two    refills. 3. Patient was given Percocet 5/325 mg one to two p.o. q.6h. p.r.n. postop    pain for after her arthroscopic surgery. 4. Follow up with me in eight weeks.  She is to stop by in another three to    four weeks for another prescription for OxyContin. DD:  02/17/00 TD:  02/21/00 Job:  [id number]

## 2011-02-25 NOTE — H&P (Signed)
Carolyn Lambert, GROUNDS              ACCOUNT NO.:  [id number]   MEDICAL RECORD NO.:  [id number]        PATIENT TYPE:  LINP   LOCATION:                               FACILITY:  [facility]   PHYSICIAN:  Ollen Gross, M.D.    DATE OF BIRTH:  Mar 05, 1941   DATE OF ADMISSION:  05/11/2009  DATE OF DISCHARGE:                              HISTORY & PHYSICAL   CHIEF COMPLAINT:  Right knee pain.   HISTORY OF PRESENT ILLNESS:  The patient is a 70 year old female who has  been seen by Dr. Ollen Gross for worsening osteoarthritis in her right  knee. She has been receiving injections for several years for the pain.  Recently the injections have stopped giving her relief.  The patient has  had a left total knee arthroplasty in the past and states that her right  knee is now feeling as bad as the left knee did prior to surgery.  The  patient feels that this is an appropriate time for her to have the right  knee replaced.   ALLERGIES:  None.   CURRENT MEDICATIONS:  1. Metformin.  2. Zoloft.  3. Xanax.  4. Biotin.  5. ICAPS.  6. Vitamin D supplements.  7. Fish oil.  8. Glucosamine.  9. Aspirin.   PAST MEDICAL HISTORY:  1. Non-insulin-dependent diabetes mellitus.  2. Arthritis.  3. Anxiety.  4. Depression.  5. Varicose veins.   PAST SURGICAL HISTORY:  1. Left total knee arthroplasty.  2. Arthroscopic surgery on her right knee.  3. Right shoulder surgery.  4. Hysterectomy.  5. Tonsillectomy.   FAMILY HISTORY:  The patient's father passed of natural causes.  Unsure  of age.  The patient's mother passed of natural causes as well but did  have hypertension and diabetes.   SOCIAL HISTORY:  The patient is married.  The patient is a nonsmoker,  nondrinker.  The patient has 5 children.  The patient plans to go home  after surgery as her daughter is planning on staying with her.   REVIEW OF SYSTEMS:  CONSTITUTIONAL: Negative for fevers, negative for  chills, negative for weight change.   Positive for fatigue.  NEURO/HEENT:  Negative for headache, negative for blurred vision, negative for balance  problems.  RESPIRATORY:  Negative for shortness of breath.  Negative for  wheezing.  Negative for cough.  CARDIOVASCULAR: Negative for chest pain,  palpitations, or difficulty breathing.  GASTROINTESTINAL:  Negative for  nausea, vomiting, diarrhea.  Negative for blood in the stool.  GENITOURINARY:  Negative for painful urination, negative for frequent  urinary tract infections.  MUSCULOSKELETAL:  Positive for joint pain,  positive for joint swelling, both in her right knee.  Positive for back  pain and positive for morning stiffness in both her back and her right  knee.   PHYSICAL EXAMINATION:  VITAL SIGNS:  Pulse 68, respirations 18, blood  pressure 140/82.  GENERAL:  The patient is a 70 year old female. She is in no acute  distress.  The patient is alert and oriented x3 and she is a good  historian.  HEENT:  Unremarkable.  NECK:  Supple.  Full range of motion.  Negative for lymphadenopathy.  CHEST:  Breath sounds are clear and equal bilaterally without rales,  rhonchi or wheezes.  HEART:  Regular rate and rhythm without murmur, rub or gallop.  ABDOMEN:  Soft, nontender. Bowel sounds are present.  No guarding on  palpation.  EXTREMITIES:  Right knee is negative for effusion.  The patient does  have pain with range of motion of her right knee.  NEUROLOGICAL:  No neurological deficits appreciated.  PERIPHERAL VASCULAR:  Negative for carotid bruit.  Carotid pulses are 2+  bilaterally.  Radial pulses are 2+ bilaterally.  Dorsalis pedis pulses  are 2+ bilaterally.   IMPRESSION:  Osteoarthritis of the right knee.   PLAN:  The patient to undergo right knee arthroplasty at [facility].  This will be performed by Dr. Ollen Gross.      Rozell Searing, [facility]      Ollen Gross, M.D.  Electronically Signed    LD/MEDQ  D:  04/26/2009  T:  04/27/2009  Job:   [id number]

## 2012-02-03 ENCOUNTER — Emergency Department (INDEPENDENT_AMBULATORY_CARE_PROVIDER_SITE_OTHER)
Admission: EM | Admit: 2012-02-03 | Discharge: 2012-02-03 | Disposition: A | Discharge status: Home / Self Care | Payer: Medicare Other | Source: Home / Self Care | Attending: Emergency Medicine | Admitting: Emergency Medicine

## 2012-02-03 ENCOUNTER — Encounter (HOSPITAL_COMMUNITY): Payer: Self-pay

## 2012-02-03 DIAGNOSIS — J329 Chronic sinusitis, unspecified: Secondary | ICD-10-CM

## 2012-02-03 HISTORY — DX: Chronic fatigue, unspecified: R53.82

## 2012-02-03 HISTORY — DX: Localized edema: R60.0

## 2012-02-03 HISTORY — DX: Fibromyalgia: M79.7

## 2012-02-03 HISTORY — DX: Major depressive disorder, single episode, unspecified: F32.9

## 2012-02-03 HISTORY — DX: Edema, unspecified: R60.9

## 2012-02-03 HISTORY — DX: Pure hypercholesterolemia, unspecified: E78.00

## 2012-02-03 HISTORY — DX: Anxiety disorder, unspecified: F41.9

## 2012-02-03 HISTORY — DX: Myalgic encephalomyelitis/chronic fatigue syndrome: G93.32

## 2012-02-03 HISTORY — DX: Acute embolism and thrombosis of unspecified deep veins of unspecified lower extremity: I82.409

## 2012-02-03 HISTORY — DX: Depression, unspecified: F32.A

## 2012-02-03 MED ORDER — FLUTICASONE PROPIONATE 50 MCG/ACT NA SUSP: 2.0000 | Freq: Every day | NASAL | Status: DC

## 2012-02-03 MED ORDER — HYDROCODONE-ACETAMINOPHEN 7.5-500 MG/15ML PO SOLN: 5.0000 mL | Freq: Four times a day (QID) | ORAL | Status: AC | PRN

## 2012-02-03 MED ORDER — AMOXICILLIN-POT CLAVULANATE 875-125 MG PO TABS: 1.0000 | ORAL_TABLET | Freq: Two times a day (BID) | ORAL | Status: AC

## 2012-02-03 NOTE — ED Provider Notes (Addendum)
History     CSN: [id number]  Arrival date & time 02/03/12  0830   First MD Initiated Contact with Patient 02/03/12 [id number]      Chief Complaint  Patient presents with  . Facial Pain    (Consider location/radiation/quality/duration/timing/severity/associated sxs/prior treatment) HPI Comments: Pt with nasal congestion, postnasal drip, ST, nonproductive cough which is worse at night, bilateral frontal sinus pain/pressure worse with bending forward/lying down, and ear fullness x 5 days.  Reports some wheezing at night, which clears with coughing. States she's unable to sleep at night because of all the coughing. No chest pain, shortness of breath. States that her upper teeth hurt. No fevers, N/V, other HA, bodyaches,  purulent nasal d/c. Taking Mucinex, using Vicks VapoRub w/o relief.  patient has a history of diabetes, states her sugar has been running "all higher than usual", but denies any abdominal pain, polyuria, polydipsia.  ROS as noted in HPI. All other ROS negative.   Patient is a 71 y.o. female presenting with headaches. The history is provided by the patient. No language interpreter was used.  Headache The primary symptoms include headaches.    Past Medical History  Diagnosis Date  . Diabetes mellitus   . DVT (deep venous thrombosis)   . Anxiety and depression   . Fibromyalgia   . Chronic fatigue syndrome   . Peripheral edema   . Hypercholesteremia     Past Surgical History  Procedure Date  . Abdominal hysterectomy   . Replacement total knee   . Back surgery   . Rotator cuff repair   . Tonsillectomy     History reviewed. No pertinent family history.  History  Substance Use Topics  . Smoking status: Never Smoker   . Smokeless tobacco: Not on file  . Alcohol Use: No    OB History    Grav Para Term Preterm Abortions TAB SAB Ect Mult Living                  Review of Systems  Neurological: Positive for headaches.    Allergies  Other  Home Medications    Current Outpatient Rx  Name Route Sig Dispense Refill  . ALPRAZOLAM 0.5 MG PO TABS Oral Take 0.5 mg by mouth 3 (three) times daily as needed.    . GUAIFENESIN ER 600 MG PO TB12 Oral Take 1,200 mg by mouth 2 (two) times daily.    Marland Kitchen METFORMIN HCL 1000 MG PO TABS Oral Take 1,000 mg by mouth 2 (two) times daily with a meal.    . SERTRALINE HCL 100 MG PO TABS Oral Take 100 mg by mouth daily.    . WARFARIN SODIUM PO Oral Take by mouth.    . AMOXICILLIN-POT CLAVULANATE 875-125 MG PO TABS Oral Take 1 tablet by mouth 2 (two) times daily. X 7 days 14 tablet 0  . FLUTICASONE PROPIONATE 50 MCG/ACT NA SUSP Nasal Place 2 sprays into the nose daily. 16 g 0  . HYDROCODONE-ACETAMINOPHEN 7.5-500 MG/15ML PO SOLN Oral Take 5 mLs by mouth every 6 (six) hours as needed for pain. 120 mL 0    BP 152/75  Pulse 90  Temp(Src) 98.1 F (36.7 C) (Oral)  Resp 16  SpO2 99%  Physical Exam  Nursing note and vitals reviewed. Constitutional: She is oriented to person, place, and time. She appears well-developed and well-nourished. No distress.          HENT:  Head: Normocephalic and atraumatic.  Right Ear: Tympanic membrane normal.  Left  Ear: Tympanic membrane normal.  Nose: Mucosal edema present. Right sinus exhibits maxillary sinus tenderness and frontal sinus tenderness. Left sinus exhibits maxillary sinus tenderness and frontal sinus tenderness.  Mouth/Throat: Uvula is midline, oropharynx is clear and moist and mucous membranes are normal.    Eyes: Conjunctivae and EOM are normal. Pupils are equal, round, and reactive to light.  Neck: Normal range of motion. Neck supple.  Cardiovascular: Normal rate and normal heart sounds.   Pulmonary/Chest: Effort normal and breath sounds normal.  Abdominal: Soft. Bowel sounds are normal. She exhibits no distension.  Musculoskeletal: Normal range of motion.  Lymphadenopathy:    She has no cervical adenopathy.  Neurological: She is alert and oriented to person, place,  and time. No cranial nerve deficit.  Skin: Skin is warm and dry.  Psychiatric: She has a normal mood and affect. Her behavior is normal. Judgment and thought content normal.    ED Course  Procedures (including critical care time)  Labs Reviewed - No data to display No results found.   1. Sinusitis     MDM  Previous records reviewed. Additional medical history obtained.  No fevers >102, has had sx for < 10 days, no h/o double sickening. No historical or objective evidence of bacterial infection. Pt significantly sxatic, will give abx but advised her to wait and fill it home with augmentin. Will start flonase, continue mucinex, increase fluids, nasal saline irrigation,  tylenol/motrin prn pain. Discussed MDM and plan with pt. Pt agrees with plan and will f/u with PMD prn.    Luiz Blare, MD 02/03/12 [id number]  Luiz Blare, MD 02/03/12 [id number]

## 2012-02-03 NOTE — Discharge Instructions (Signed)
Take a probiotic to help reduce the chance of getting diarrhea from the augmentin. Take the medication as written. Return if you get worse, have a fever >100.4, or for any concerns. You may take 600 mg of motrin with 1 gram of tylenol up to 4 times a day as needed for pain. This is an effective combination for pain.  Most sinus infections are viral and do not need antibiotics unless you have a high fever, have had this for 10 day, or you get better and then get sick again. Use a neti pot or the NeilMed sinus rinse as often as you want to to reduce nasal congestion. Follow the directions on the box.   Go to www.goodrx.com to look up your medications. This will give you a list of where you can find your prescriptions at the most affordable prices.

## 2012-02-03 NOTE — ED Notes (Signed)
C/o post nasal drainage, productive cough of green sputum, chills, ear pain, headache and facial pain.  Sx for 4-5 days.

## 2012-03-19 ENCOUNTER — Encounter (HOSPITAL_COMMUNITY): Payer: Self-pay | Admitting: Psychiatry

## 2012-03-19 ENCOUNTER — Ambulatory Visit (INDEPENDENT_AMBULATORY_CARE_PROVIDER_SITE_OTHER): Payer: BC Managed Care – PPO | Admitting: Psychiatry

## 2012-03-19 VITALS — BP 114/75 | HR 61 | Ht 67.0 in | Wt 270.0 lb

## 2012-03-19 DIAGNOSIS — F4310 Post-traumatic stress disorder, unspecified: Secondary | ICD-10-CM

## 2012-03-19 DIAGNOSIS — F329 Major depressive disorder, single episode, unspecified: Secondary | ICD-10-CM

## 2012-03-19 DIAGNOSIS — F332 Major depressive disorder, recurrent severe without psychotic features: Secondary | ICD-10-CM

## 2012-03-19 MED ORDER — SERTRALINE HCL 50 MG PO TABS: ORAL_TABLET | ORAL | Status: DC
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
## 2012-03-20 ENCOUNTER — Encounter (HOSPITAL_COMMUNITY): Payer: Self-pay | Admitting: Psychiatry

## 2012-03-20 DIAGNOSIS — F4310 Post-traumatic stress disorder, unspecified: Secondary | ICD-10-CM | POA: Insufficient documentation

## 2012-03-20 DIAGNOSIS — F329 Major depressive disorder, single episode, unspecified: Secondary | ICD-10-CM | POA: Insufficient documentation

## 2012-03-20 HISTORY — DX: Major depressive disorder, single episode, unspecified: F32.9

## 2012-04-09 DIAGNOSIS — J329 Chronic sinusitis, unspecified: Secondary | ICD-10-CM

## 2012-04-09 HISTORY — DX: Chronic sinusitis, unspecified: J32.9

## 2012-04-18 ENCOUNTER — Encounter (HOSPITAL_COMMUNITY): Payer: Self-pay | Admitting: Psychiatry

## 2012-04-25 ENCOUNTER — Encounter (HOSPITAL_COMMUNITY): Payer: Self-pay | Admitting: Psychiatry

## 2012-04-25 ENCOUNTER — Ambulatory Visit (INDEPENDENT_AMBULATORY_CARE_PROVIDER_SITE_OTHER): Payer: BC Managed Care – PPO | Admitting: Psychiatry

## 2012-04-25 VITALS — BP 138/83 | HR 83 | Ht 67.0 in | Wt 268.0 lb

## 2012-04-25 DIAGNOSIS — F332 Major depressive disorder, recurrent severe without psychotic features: Secondary | ICD-10-CM

## 2012-04-25 DIAGNOSIS — F4310 Post-traumatic stress disorder, unspecified: Secondary | ICD-10-CM

## 2012-04-25 DIAGNOSIS — F329 Major depressive disorder, single episode, unspecified: Secondary | ICD-10-CM

## 2012-04-25 MED ORDER — SERTRALINE HCL 100 MG PO TABS: 150.0000 mg | ORAL_TABLET | Freq: Every day | ORAL | Status: DC
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
## 2012-05-01 NOTE — Progress Notes (Signed)
The encounter has been marked as a no show.

## 2012-05-01 NOTE — Progress Notes (Signed)
This encounter was created in error - please disregard.

## 2012-05-11 ENCOUNTER — Telehealth (HOSPITAL_COMMUNITY): Payer: Self-pay | Admitting: Psychiatry

## 2012-05-11 NOTE — Telephone Encounter (Signed)
Will see patient this month.

## 2012-05-23 ENCOUNTER — Ambulatory Visit (HOSPITAL_COMMUNITY): Payer: Self-pay | Admitting: Psychiatry

## 2012-06-11 ENCOUNTER — Telehealth (HOSPITAL_COMMUNITY): Payer: Self-pay | Admitting: Psychiatry

## 2012-06-11 NOTE — Telephone Encounter (Signed)
Patient reports she had a prescription of sertraline filled at [facility]. Will see patient on 06/14/2012. She reports she has decrease her Xanax to one 0.5 mg tablet, half in the morning and half tablet in the evening.

## 2012-06-14 ENCOUNTER — Encounter (HOSPITAL_COMMUNITY): Payer: Self-pay | Admitting: Psychiatry

## 2012-06-14 ENCOUNTER — Ambulatory Visit (INDEPENDENT_AMBULATORY_CARE_PROVIDER_SITE_OTHER): Payer: BC Managed Care – PPO | Admitting: Psychiatry

## 2012-06-14 VITALS — BP 145/85 | HR 64 | Ht 67.0 in | Wt 270.0 lb

## 2012-06-14 DIAGNOSIS — F329 Major depressive disorder, single episode, unspecified: Secondary | ICD-10-CM

## 2012-06-14 DIAGNOSIS — F4310 Post-traumatic stress disorder, unspecified: Secondary | ICD-10-CM

## 2012-06-14 DIAGNOSIS — F332 Major depressive disorder, recurrent severe without psychotic features: Secondary | ICD-10-CM

## 2012-06-14 MED ORDER — SERTRALINE HCL 100 MG PO TABS: 150.0000 mg | ORAL_TABLET | Freq: Every day | ORAL | Status: DC
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
## 2012-06-15 ENCOUNTER — Telehealth (HOSPITAL_COMMUNITY): Payer: Self-pay | Admitting: Psychiatry

## 2012-06-15 NOTE — Telephone Encounter (Signed)
Opened in error

## 2012-06-16 ENCOUNTER — Encounter (HOSPITAL_COMMUNITY): Payer: Self-pay | Admitting: Psychiatry

## 2012-08-14 ENCOUNTER — Encounter (HOSPITAL_COMMUNITY): Payer: Self-pay | Admitting: Psychiatry

## 2012-08-14 ENCOUNTER — Ambulatory Visit (INDEPENDENT_AMBULATORY_CARE_PROVIDER_SITE_OTHER): Payer: BC Managed Care – PPO | Admitting: Psychiatry

## 2012-08-14 VITALS — BP 136/93 | HR 86 | Ht 67.0 in | Wt 267.0 lb

## 2012-08-14 DIAGNOSIS — F332 Major depressive disorder, recurrent severe without psychotic features: Secondary | ICD-10-CM

## 2012-08-14 DIAGNOSIS — F4310 Post-traumatic stress disorder, unspecified: Secondary | ICD-10-CM

## 2012-08-14 MED ORDER — SERTRALINE HCL 100 MG PO TABS: 200.0000 mg | ORAL_TABLET | Freq: Every day | ORAL | Status: DC
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
## 2012-08-17 ENCOUNTER — Telehealth (HOSPITAL_COMMUNITY): Payer: Self-pay

## 2012-08-17 NOTE — Telephone Encounter (Signed)
Called patient.  She slept well initially with 8 mg of melatonin, but did not sleep well after that with continued interrupted sleep.  PLAN: Advised patient to increase melatonin to 10 mg at bedtime. And call on Monday.

## 2012-08-17 NOTE — Telephone Encounter (Signed)
CALLING TO GIVE UPDATE ON MEDICATION. SHE DID WELL THE FIRST NIGHT BUT BELIEVES IT IS BECAUSE SHE WAS VERY TIRED. BUT NOW IS STILL HAVING TROUBLE SLEEPING

## 2012-09-13 ENCOUNTER — Ambulatory Visit (HOSPITAL_COMMUNITY): Payer: Self-pay | Admitting: Psychiatry

## 2012-11-09 ENCOUNTER — Ambulatory Visit (HOSPITAL_COMMUNITY): Payer: Self-pay | Admitting: Psychiatry

## 2012-11-12 ENCOUNTER — Other Ambulatory Visit: Payer: Self-pay | Admitting: Family Medicine

## 2012-11-12 DIAGNOSIS — Z1231 Encounter for screening mammogram for malignant neoplasm of breast: Secondary | ICD-10-CM

## 2012-11-14 ENCOUNTER — Ambulatory Visit (INDEPENDENT_AMBULATORY_CARE_PROVIDER_SITE_OTHER): Payer: Medicare Other | Admitting: Psychiatry

## 2012-11-14 ENCOUNTER — Encounter (HOSPITAL_COMMUNITY): Payer: Self-pay | Admitting: Psychiatry

## 2012-11-14 VITALS — BP 132/85 | HR 68 | Ht 67.0 in | Wt 261.0 lb

## 2012-11-14 DIAGNOSIS — F4310 Post-traumatic stress disorder, unspecified: Secondary | ICD-10-CM

## 2012-11-14 DIAGNOSIS — F329 Major depressive disorder, single episode, unspecified: Secondary | ICD-10-CM

## 2012-11-14 DIAGNOSIS — F332 Major depressive disorder, recurrent severe without psychotic features: Secondary | ICD-10-CM

## 2012-11-14 MED ORDER — SERTRALINE HCL 100 MG PO TABS: 200.0000 mg | ORAL_TABLET | Freq: Every day | ORAL | Status: DC

## 2012-11-14 MED ORDER — TRAZODONE HCL 50 MG PO TABS: 50.0000 mg | ORAL_TABLET | Freq: Every day | ORAL | Status: DC
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
## 2012-11-28 ENCOUNTER — Ambulatory Visit: Payer: Self-pay

## 2012-12-12 ENCOUNTER — Ambulatory Visit (INDEPENDENT_AMBULATORY_CARE_PROVIDER_SITE_OTHER): Payer: Medicare Other | Admitting: Psychiatry

## 2012-12-12 ENCOUNTER — Other Ambulatory Visit (HOSPITAL_COMMUNITY): Payer: Self-pay | Admitting: Psychiatry

## 2012-12-12 ENCOUNTER — Encounter (HOSPITAL_COMMUNITY): Payer: Self-pay | Admitting: Psychiatry

## 2012-12-12 VITALS — BP 142/79 | HR 60 | Ht 67.0 in | Wt 258.0 lb

## 2012-12-12 DIAGNOSIS — F4310 Post-traumatic stress disorder, unspecified: Secondary | ICD-10-CM

## 2012-12-12 DIAGNOSIS — F329 Major depressive disorder, single episode, unspecified: Secondary | ICD-10-CM

## 2012-12-12 DIAGNOSIS — F332 Major depressive disorder, recurrent severe without psychotic features: Secondary | ICD-10-CM

## 2012-12-12 MED ORDER — TRAZODONE HCL 50 MG PO TABS: 50.0000 mg | ORAL_TABLET | Freq: Every day | ORAL | Status: DC

## 2012-12-12 MED ORDER — SERTRALINE HCL 100 MG PO TABS: 200.0000 mg | ORAL_TABLET | Freq: Every day | ORAL | Status: DC
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
## 2013-01-15 ENCOUNTER — Ambulatory Visit
Admission: RE | Admit: 2013-01-15 | Discharge: 2013-01-15 | Disposition: A | Discharge status: Home / Self Care | Payer: Medicare PPO | Source: Ambulatory Visit | Attending: Family Medicine | Admitting: Family Medicine

## 2013-01-15 DIAGNOSIS — Z1231 Encounter for screening mammogram for malignant neoplasm of breast: Secondary | ICD-10-CM

## 2013-02-11 ENCOUNTER — Ambulatory Visit (HOSPITAL_COMMUNITY): Payer: Self-pay | Admitting: Psychiatry

## 2013-02-22 ENCOUNTER — Encounter (HOSPITAL_COMMUNITY): Payer: Self-pay | Admitting: Psychiatry

## 2013-02-22 ENCOUNTER — Ambulatory Visit (INDEPENDENT_AMBULATORY_CARE_PROVIDER_SITE_OTHER): Payer: Medicare PPO | Admitting: Psychiatry

## 2013-02-22 VITALS — BP 126/72 | HR 73 | Ht 67.0 in | Wt 248.5 lb

## 2013-02-22 DIAGNOSIS — F4310 Post-traumatic stress disorder, unspecified: Secondary | ICD-10-CM

## 2013-02-22 DIAGNOSIS — F332 Major depressive disorder, recurrent severe without psychotic features: Secondary | ICD-10-CM

## 2013-02-22 DIAGNOSIS — F329 Major depressive disorder, single episode, unspecified: Secondary | ICD-10-CM

## 2013-02-22 MED ORDER — SERTRALINE HCL 100 MG PO TABS: 200.0000 mg | ORAL_TABLET | Freq: Every day | ORAL | Status: DC

## 2013-02-22 MED ORDER — TRAZODONE HCL 50 MG PO TABS: 50.0000 mg | ORAL_TABLET | Freq: Every day | ORAL | Status: DC
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
## 2013-04-11 ENCOUNTER — Other Ambulatory Visit: Payer: Self-pay | Admitting: Endocrinology

## 2013-04-11 DIAGNOSIS — E785 Hyperlipidemia, unspecified: Secondary | ICD-10-CM

## 2013-04-11 DIAGNOSIS — IMO0001 Reserved for inherently not codable concepts without codable children: Secondary | ICD-10-CM

## 2013-04-16 ENCOUNTER — Other Ambulatory Visit: Payer: Self-pay

## 2013-04-16 ENCOUNTER — Other Ambulatory Visit (INDEPENDENT_AMBULATORY_CARE_PROVIDER_SITE_OTHER): Payer: Medicare PPO

## 2013-04-16 DIAGNOSIS — E785 Hyperlipidemia, unspecified: Secondary | ICD-10-CM

## 2013-04-16 DIAGNOSIS — IMO0001 Reserved for inherently not codable concepts without codable children: Secondary | ICD-10-CM

## 2013-04-16 LAB — BASIC METABOLIC PANEL
BUN: 16 mg/dL (ref 6–23)
CO2: 28 mEq/L (ref 19–32)
Calcium: 9.7 mg/dL (ref 8.4–10.5)
Chloride: 106 mEq/L (ref 96–112)
Creatinine, Ser: 0.7 mg/dL (ref 0.4–1.2)
GFR: 87.33 mL/min (ref >60.00)
Glucose, Bld: 126 mg/dL — ABNORMAL HIGH (ref 70–99)
Potassium: 4.3 mEq/L (ref 3.5–5.1)
Sodium: 142 mEq/L (ref 135–145)

## 2013-04-16 LAB — LIPID PANEL
Cholesterol: 201 mg/dL — ABNORMAL HIGH (ref 0–200)
HDL: 64.3 mg/dL (ref >39.00)
Total CHOL/HDL Ratio: 3
Triglycerides: 144 mg/dL (ref 0.0–149.0)
VLDL: 28.8 mg/dL (ref 0.0–40.0)

## 2013-04-16 LAB — HEMOGLOBIN A1C: Hgb A1c MFr Bld: 6.9 % — ABNORMAL HIGH (ref 4.6–6.5)

## 2013-04-16 LAB — LDL CHOLESTEROL, DIRECT: Direct LDL: 124 mg/dL

## 2013-04-18 ENCOUNTER — Ambulatory Visit (INDEPENDENT_AMBULATORY_CARE_PROVIDER_SITE_OTHER): Payer: Medicare PPO | Admitting: Endocrinology

## 2013-04-18 ENCOUNTER — Other Ambulatory Visit: Payer: Self-pay | Admitting: *Deleted

## 2013-04-18 VITALS — BP 130/78 | HR 64 | Ht 67.0 in | Wt 247.8 lb

## 2013-04-18 DIAGNOSIS — E7800 Pure hypercholesterolemia, unspecified: Secondary | ICD-10-CM

## 2013-04-18 DIAGNOSIS — E042 Nontoxic multinodular goiter: Secondary | ICD-10-CM

## 2013-04-18 DIAGNOSIS — IMO0001 Reserved for inherently not codable concepts without codable children: Secondary | ICD-10-CM

## 2013-04-18 MED ORDER — ATORVASTATIN CALCIUM 20 MG PO TABS: 20.0000 mg | ORAL_TABLET | Freq: Every day | ORAL | Status: DC

## 2013-04-18 MED ORDER — LIRAGLUTIDE 18 MG/3ML ~~LOC~~ SOPN: 1.2000 mg | PEN_INJECTOR | Freq: Every day | SUBCUTANEOUS | Status: DC

## 2013-04-18 MED ORDER — GLUCOSE BLOOD VI STRP: ORAL_STRIP | Status: DC

## 2013-04-18 NOTE — Patient Instructions (Addendum)
Start VICTOZA injection with the sample pen once daily at the same time of the day preferably at bedtime.  Dial the dose to 0.6 mg for the first week.  You may  experience nausea in the first few days which usually gets better the After 1 week increase the dose to 1.2mg  daily if no nausea.  You may inject in the stomach, thigh or arm.   You will feel fullness of the stomach with starting the medication and should try to keep portions of food small.    Stop morning metformin  Start using the Accu-Chek meter, continue checking once or twice a day either before breakfast or 2 hours after any of the meals.  Increase activity as tolerated   James pravastatin to Lipitor 20 mg daily

## 2013-04-18 NOTE — Progress Notes (Signed)
Patient ID: Carolyn Lambert, female   DOB: 1941/08/12, 72 y.o.   MRN: [id number]  Reason for Appointment: Diabetes follow-up   History of Present Illness   History of Present Illness   Diabetes History:.   Diagnosis: date of diagnosis: 2004, Type 2 diabetes mellitus.  Complications: none. No retinopathy, peripheral neuropathy, nephropathy.  Monitors blood glucose: Twice a day.  Glucometer: unsure.  Blood Glucose readings: Before breakfast: 120-140  Other hs 150-180   Hypoglycemia frequency: Never.   Meals: 3 meals per day.  Physical activity: exercise: 30 min with [employer], 3/7 days.  Dietician visit: Most recent: years ago.  Retinal exam: Most recent: in 1/14.   Diabetes Mellitus:  The patient is seen today in follow up of Type 2 diabetes. Previous history: She does not know how high her blood sugars were at diagnosis but apparently was started on metformin for treatment. She has been on this consistently since then. Subsequently she started watching her diet much better and exercising. She was able to lose weight and blood sugars had improved.  RECENT history:  She had been doing well with upper normal A1c as of 4/14.  However herdiabetes control appears to be somewhat worse with increasing A1c. Also she appears to be having some diarrhea in the mornings which she was blaming on nonspecific factors but has been on maximum dose metformin. She has relatively higher postprandial readings although have not been able to review her home readings with the meter download or diary. She is very interested in weight loss and not able to do this consistently on her own.The last HbgA1c was 6.8% now and previously 5.9 % done on 01/15/13.  The microalbumin has not been tested.  Compliance with her diet has been variable and recently not as good when traveling. Overall has lost a little weight since her last visit      HYPERLIPIDEMIA: LDL is not at goal on pravastatin, on the last visit we  discussed that she should be on a more potent statin and was supposed to switch to Lipitor 20 mg. She has not gone done this and LDL is still relatively high at 0.24   LABS:  Appointment on 04/16/2013  Component Date Value Range Status  . Hemoglobin A1C 04/16/2013 6.9* 4.6 - 6.5 % Final   Glycemic Control Guidelines for People with Diabetes:Non Diabetic:  <6%Goal of Therapy: <7%Additional Action Suggested:  >8%   . Sodium 04/16/2013 142  135 - 145 mEq/L Final  . Potassium 04/16/2013 4.3  3.5 - 5.1 mEq/L Final  . Chloride 04/16/2013 106  96 - 112 mEq/L Final  . CO2 04/16/2013 28  19 - 32 mEq/L Final  . Glucose, Bld 04/16/2013 126* 70 - 99 mg/dL Final  . BUN 78/29/5621 16  6 - 23 mg/dL Final  . Creatinine, Ser 04/16/2013 0.7  0.4 - 1.2 mg/dL Final  . Calcium 30/86/5784 9.7  8.4 - 10.5 mg/dL Final  . GFR 69/62/9528 87.33  >60.00 mL/min Final  . Cholesterol 04/16/2013 201* 0 - 200 mg/dL Final   ATP III Classification       Desirable:  < 200 mg/dL               Borderline High:  200 - 239 mg/dL          High:  > = 413 mg/dL  . Triglycerides 04/16/2013 144.0  0.0 - 149.0 mg/dL Final   Normal:  <244 mg/dLBorderline High:  150 - 199 mg/dL  . HDL  04/16/2013 64.30  >39.00 mg/dL Final  . VLDL 16/07/9603 28.8  0.0 - 40.0 mg/dL Final  . Total CHOL/HDL Ratio 04/16/2013 3   Final                  Men          Women1/2 Average Risk     3.4          3.3Average Risk          5.0          4.42X Average Risk          9.6          7.13X Average Risk          15.0          11.0                      . Direct LDL 04/16/2013 124.0   Final   Optimal:  <100 mg/dLNear or Above Optimal:  100-129 mg/dLBorderline High:  130-159 mg/dLHigh:  160-189 mg/dLVery High:  >190 mg/dL      Medication List       This list is accurate as of: 04/18/13 12:20 PM.  Always use your most recent med list.               baclofen 10 MG tablet  Commonly known as:  LIORESAL  Take 100 mg by mouth 2 (two) times daily.      guaiFENesin 600 MG 12 hr tablet  Commonly known as:  MUCINEX  Take 1,200 mg by mouth 2 (two) times daily.     HYDROcodone-acetaminophen 5-325 MG per tablet  Commonly known as:  NORCO/VICODIN  PRN     metFORMIN 1000 MG tablet  Commonly known as:  GLUCOPHAGE  Take 1,000 mg by mouth 2 (two) times daily with a meal.     pravastatin 40 MG tablet  Commonly known as:  PRAVACHOL  Take 40 mg by mouth daily.     sertraline 100 MG tablet  Commonly known as:  ZOLOFT  Take 2 tablets (200 mg total) by mouth daily.     traZODone 50 MG tablet  Commonly known as:  DESYREL  Take 1 tablet (50 mg total) by mouth at bedtime.     WARFARIN SODIUM PO  Take 7.5 mg by mouth daily.        Allergies:  Allergies  Allergen Reactions  . Other     Pain medication- states causes n/v    Past Medical History  Diagnosis Date  . Diabetes mellitus   . DVT (deep venous thrombosis)   . Anxiety and depression   . Fibromyalgia   . Chronic fatigue syndrome   . Peripheral edema   . Hypercholesteremia   . Major depressive disorder 03/20/2012  . Sinusitis July 2013    Past Surgical History  Procedure Laterality Date  . Abdominal hysterectomy    . Replacement total knee    . Back surgery    . Rotator cuff repair    . Tonsillectomy      Family History  Problem Relation Age of Onset  . Hypertension Mother   . Atrial fibrillation Mother   . Cancer Father   . Diabetes type II Father   . Depression Daughter     Social History:  reports that she has never smoked. She does not have any smokeless tobacco history on file. She reports that she does not drink alcohol  or use illicit drugs.  REVIEW of systems:  Multinodular Goiter:  She has had a multinodular goiter for several years. She was followed by an ENT surgeon but records are not available. She says her midline nodule had been stable in size and she was discharged from his care  She has a history of major depressive disorder  No history of  hypertension   Examination:   BP 130/78  Pulse 64  Ht 5\' 7"  (1.702 m)  Wt 247 lb 12.8 oz (112.401 kg)  BMI 38.8 kg/m2  SpO2 98%  Body mass index is 38.8 kg/(m^2).   No ankle edema present  Assesment:   Diabetes type 2, uncontrolled - 250.02  The patient's diabetes control appears to be somewhat worse with increasing A1c. Also she appears to be having some diarrhea from metformin which she did  not recognize. Probably has relatively higher postprandial readings although have not been able to review her home readings with the meter download or diary. She is very interested in weight loss and not able to do this consistently on her own. Victoza was discussed today and she is interested in trying this. Discussed how it works, injection technique, doses titration, possible side effects, benefits Also emphasized need to continue improved diet and regular exercise as well as blood sugar monitoring, targets and using Accu-Chek meter for accuracy  HYPERLIPIDEMIA: Discussed lipid targets, need for a higher dose statin because of her multiple risk factors and she agrees to switch to Lipitor as previously directed. Will need to followup on next visit. Counseling time over 50% of today's 25 minute visit  Carolyn Lambert 04/18/2013, 12:20 PM

## 2013-04-19 ENCOUNTER — Encounter: Payer: Self-pay | Admitting: Endocrinology

## 2013-04-19 DIAGNOSIS — E119 Type 2 diabetes mellitus without complications: Secondary | ICD-10-CM | POA: Insufficient documentation

## 2013-04-19 DIAGNOSIS — E7800 Pure hypercholesterolemia, unspecified: Secondary | ICD-10-CM | POA: Insufficient documentation

## 2013-05-20 ENCOUNTER — Telehealth: Payer: Self-pay | Admitting: *Deleted

## 2013-05-20 MED ORDER — METFORMIN HCL 1000 MG PO TABS: 1000.0000 mg | ORAL_TABLET | Freq: Every day | ORAL | Status: DC

## 2013-05-20 NOTE — Telephone Encounter (Signed)
Pt said since she started on the higher dose of Victoza -1.2 she started having problems with bowel movements, so she stopped taking it and started back on her metformin 1000 mg bid, she says you wanted her to come back in 6 weeks, but since she has stopped the new medication she wants to know if you still want her back then, or for her to come back in 3 months like she normally would?  She also says her PCP told her that you should be the one to prescribe her Metformin and she's out of that and would like an rx sent in., Please advise

## 2013-05-20 NOTE — Telephone Encounter (Signed)
Metformin will not control her diabetes by itself. She can try taking the 0.6 mg Victoza with the metformin She will need to see me in 6 weeks to reassess Okay to send prescription for metformin

## 2013-05-22 ENCOUNTER — Other Ambulatory Visit: Payer: Self-pay | Admitting: *Deleted

## 2013-05-22 MED ORDER — GLUCOSE BLOOD VI STRP: ORAL_STRIP | Status: DC

## 2013-05-27 ENCOUNTER — Ambulatory Visit (INDEPENDENT_AMBULATORY_CARE_PROVIDER_SITE_OTHER): Payer: Medicare PPO | Admitting: Psychiatry

## 2013-05-27 ENCOUNTER — Encounter (HOSPITAL_COMMUNITY): Payer: Self-pay | Admitting: Psychiatry

## 2013-05-27 VITALS — BP 139/77 | HR 75 | Ht 67.0 in | Wt 244.0 lb

## 2013-05-27 DIAGNOSIS — F329 Major depressive disorder, single episode, unspecified: Secondary | ICD-10-CM

## 2013-05-27 DIAGNOSIS — F4310 Post-traumatic stress disorder, unspecified: Secondary | ICD-10-CM

## 2013-05-27 DIAGNOSIS — F332 Major depressive disorder, recurrent severe without psychotic features: Secondary | ICD-10-CM

## 2013-05-27 MED ORDER — SERTRALINE HCL 100 MG PO TABS: 200.0000 mg | ORAL_TABLET | Freq: Every day | ORAL | Status: DC

## 2013-05-27 MED ORDER — TRAZODONE HCL 50 MG PO TABS: ORAL_TABLET | ORAL | Status: DC
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
## 2013-05-30 ENCOUNTER — Encounter: Payer: Self-pay | Admitting: Endocrinology

## 2013-05-30 ENCOUNTER — Ambulatory Visit (INDEPENDENT_AMBULATORY_CARE_PROVIDER_SITE_OTHER): Payer: Medicare PPO | Admitting: Endocrinology

## 2013-05-30 VITALS — BP 132/70 | HR 82 | Temp 98.2°F | Resp 12 | Ht 67.0 in | Wt 242.2 lb

## 2013-05-30 DIAGNOSIS — E7800 Pure hypercholesterolemia, unspecified: Secondary | ICD-10-CM

## 2013-05-30 DIAGNOSIS — IMO0001 Reserved for inherently not codable concepts without codable children: Secondary | ICD-10-CM

## 2013-05-30 NOTE — Patient Instructions (Addendum)
Please check blood sugars at least half the time about 2 hours after any meal and 3x per week on waking up. Please bring blood sugar monitor to each visit

## 2013-05-30 NOTE — Progress Notes (Signed)
Patient ID: Carolyn Lambert, female   DOB: 1941/02/07, 72 y.o.   MRN: [id number]  Reason for Appointment: Diabetes follow-up   History of Present Illness   Diagnosis: date of diagnosis: 2004, Type 2 diabetes mellitus.  Complications: none. No retinopathy, peripheral neuropathy, nephropathy.  Monitors blood glucose: Less than once a day.  Glucometer: unsure.  Blood Glucose readings: Before breakfast: 112-149 afternoon 115-149, only one reading at 9 PM = 89 Hypoglycemia frequency: Never.   Meals: 3 meals per day.  Physical activity: exercise: 30 min with [employer], 3/7 days.  Dietician visit: Most recent: years ago.  Retinal exam: Most recent: in 1/14.   Diabetes Mellitus:  The patient is seen today in follow up of Type 2 diabetes.  Previous history: She does not know how high her blood sugars were at diagnosis but apparently was started on metformin for treatment. She has been on this consistently since then. Subsequently she started watching her diet much better and exercising. She was able to lose weight and blood sugars had improved.  RECENT history:  She had been doing well with glucose better controlled with adding Victoza and average reading 123 She did not increase her Victoza to the 1.2 mg but her blood sugars are still fairly good. No side effects Previously her A1c was upper normal at 5.9 % done on 01/15/13 and she had difficulty losing weight She has started Victoza and tolerating this well since last month and has lost 5 pounds Also taking metformin 1000 mg, this was reduced on the last visit because of diarrhea   Compliance with her diet has been variable and recently has been able to cut back on portions  Wt Readings from Last 3 Encounters:  05/30/13 242 lb 3.2 oz (109.861 kg)  05/27/13 244 lb (110.678 kg)  04/18/13 247 lb 12.8 oz (112.401 kg)    HYPERLIPIDEMIA: LDL is not at goal on pravastatin, on the last visit  was switched to Lipitor 20 mg.   Previous LABS:  No  visits with results within 1 Week(s) from this visit. Latest known visit with results is:  Appointment on 04/16/2013  Component Date Value Range Status  . Hemoglobin A1C 04/16/2013 6.9* 4.6 - 6.5 % Final   Glycemic Control Guidelines for People with Diabetes:Non Diabetic:  <6%Goal of Therapy: <7%Additional Action Suggested:  >8%   . Sodium 04/16/2013 142  135 - 145 mEq/L Final  . Potassium 04/16/2013 4.3  3.5 - 5.1 mEq/L Final  . Chloride 04/16/2013 106  96 - 112 mEq/L Final  . CO2 04/16/2013 28  19 - 32 mEq/L Final  . Glucose, Bld 04/16/2013 126* 70 - 99 mg/dL Final  . BUN 40/98/1191 16  6 - 23 mg/dL Final  . Creatinine, Ser 04/16/2013 0.7  0.4 - 1.2 mg/dL Final  . Calcium 47/82/9562 9.7  8.4 - 10.5 mg/dL Final  . GFR 13/05/6577 87.33  >60.00 mL/min Final  . Cholesterol 04/16/2013 201* 0 - 200 mg/dL Final   ATP III Classification       Desirable:  < 200 mg/dL               Borderline High:  200 - 239 mg/dL          High:  > = 469 mg/dL  . Triglycerides 04/16/2013 144.0  0.0 - 149.0 mg/dL Final   Normal:  <629 mg/dLBorderline High:  150 - 199 mg/dL  . HDL 04/16/2013 64.30  >39.00 mg/dL Final  . VLDL 52/84/1324 28.8  0.0 - 40.0 mg/dL Final  . Total CHOL/HDL Ratio 04/16/2013 3   Final                  Men          Women1/2 Average Risk     3.4          3.3Average Risk          5.0          4.42X Average Risk          9.6          7.13X Average Risk          15.0          11.0                      . Direct LDL 04/16/2013 124.0   Final   Optimal:  <100 mg/dLNear or Above Optimal:  100-129 mg/dLBorderline High:  130-159 mg/dLHigh:  160-189 mg/dLVery High:  >190 mg/dL      Medication List       This list is accurate as of: 05/30/13  1:11 PM.  Always use your most recent med list.               atorvastatin 20 MG tablet  Commonly known as:  LIPITOR  Take 1 tablet (20 mg total) by mouth daily.     baclofen 10 MG tablet  Commonly known as:  LIORESAL  Take 100 mg by mouth 2 (two)  times daily.     glucose blood test strip  Commonly known as:  ACCU-CHEK SMARTVIEW  Use to check blood sugars 2 times per day     guaiFENesin 600 MG 12 hr tablet  Commonly known as:  MUCINEX  Take 1,200 mg by mouth 2 (two) times daily.     HYDROcodone-acetaminophen 5-325 MG per tablet  Commonly known as:  NORCO/VICODIN  PRN     Liraglutide 18 MG/3ML Sopn  Inject 0.6 mg into the skin daily.     metFORMIN 1000 MG tablet  Commonly known as:  GLUCOPHAGE  Take 1 tablet (1,000 mg total) by mouth daily with breakfast.     sertraline 100 MG tablet  Commonly known as:  ZOLOFT  Take 2 tablets (200 mg total) by mouth daily.     traZODone 50 MG tablet  Commonly known as:  DESYREL  one and one half to two whole tablet daily.     WARFARIN SODIUM PO  Take 7.5 mg by mouth daily.        Allergies:  Allergies  Allergen Reactions  . Other     Pain medication- states causes n/v    Past Medical History  Diagnosis Date  . Diabetes mellitus   . DVT (deep venous thrombosis)   . Anxiety and depression   . Fibromyalgia   . Chronic fatigue syndrome   . Peripheral edema   . Hypercholesteremia   . Major depressive disorder 03/20/2012  . Sinusitis July 2013    Past Surgical History  Procedure Laterality Date  . Abdominal hysterectomy    . Replacement total knee    . Back surgery    . Rotator cuff repair    . Tonsillectomy      Family History  Problem Relation Age of Onset  . Hypertension Mother   . Atrial fibrillation Mother   . Cancer Father   . Diabetes type II Father   . Depression Daughter  Social History:  reports that she has never smoked. She does not have any smokeless tobacco history on file. She reports that she does not drink alcohol or use illicit drugs.  REVIEW of systems:  Multinodular Goiter:  She has had a multinodular goiter for several years. She was followed by an ENT surgeon but records are not available. She says her midline nodule had been  stable in size and she was discharged from his care  She has a history of major depressive disorder  No history of hypertension   Examination:   BP 132/70  Pulse 82  Temp(Src) 98.2 F (36.8 C)  Resp 12  Ht 5\' 7"  (1.702 m)  Wt 242 lb 3.2 oz (109.861 kg)  BMI 37.92 kg/m2  SpO2 96%  Body mass index is 37.92 kg/(m^2).   No ankle edema present  Thyroid nodule felt on the left side towards the midline, smooth and firm  Assesment:   Diabetes type 2, uncontrolled - 250.02  The patient's diabetes control appears to be better with starting Victoza which she is tolerating Also has been able to take 1000 mg metformin a day with less diarrhea than previously used 2000 mg Encouraged her to check more readings after supper  HYPERLIPIDEMIA:  Will need to followup on next visit since she is now on Lipitor . Counseling time over 50% of today's 25 minute visit  Michaell Grider 05/30/2013, 1:11 PM

## 2013-06-17 ENCOUNTER — Other Ambulatory Visit (HOSPITAL_COMMUNITY): Payer: Self-pay | Admitting: Psychiatry

## 2013-06-17 DIAGNOSIS — F329 Major depressive disorder, single episode, unspecified: Secondary | ICD-10-CM

## 2013-06-17 MED ORDER — TRAZODONE HCL 50 MG PO TABS: ORAL_TABLET | ORAL | Status: DC

## 2013-07-29 ENCOUNTER — Other Ambulatory Visit (INDEPENDENT_AMBULATORY_CARE_PROVIDER_SITE_OTHER): Payer: Medicare PPO

## 2013-07-29 DIAGNOSIS — E7800 Pure hypercholesterolemia, unspecified: Secondary | ICD-10-CM

## 2013-07-29 DIAGNOSIS — E042 Nontoxic multinodular goiter: Secondary | ICD-10-CM

## 2013-07-29 DIAGNOSIS — IMO0001 Reserved for inherently not codable concepts without codable children: Secondary | ICD-10-CM

## 2013-07-29 LAB — COMPREHENSIVE METABOLIC PANEL
ALT: 14 U/L (ref 0–35)
AST: 16 U/L (ref 0–37)
Albumin: 3.9 g/dL (ref 3.5–5.2)
Alkaline Phosphatase: 66 U/L (ref 39–117)
BUN: 15 mg/dL (ref 6–23)
CO2: 31 mEq/L (ref 19–32)
Calcium: 9.3 mg/dL (ref 8.4–10.5)
Chloride: 106 mEq/L (ref 96–112)
Creatinine, Ser: 0.7 mg/dL (ref 0.4–1.2)
GFR: 87.26 mL/min (ref >60.00)
Glucose, Bld: 110 mg/dL — ABNORMAL HIGH (ref 70–99)
Potassium: 4.8 mEq/L (ref 3.5–5.1)
Sodium: 143 mEq/L (ref 135–145)
Total Bilirubin: 0.6 mg/dL (ref 0.3–1.2)
Total Protein: 6.6 g/dL (ref 6.0–8.3)

## 2013-07-29 LAB — LIPID PANEL
Cholesterol: 152 mg/dL (ref 0–200)
HDL: 55 mg/dL (ref >39.00)
LDL Cholesterol: 79 mg/dL (ref 0–99)
Total CHOL/HDL Ratio: 3
Triglycerides: 89 mg/dL (ref 0.0–149.0)
VLDL: 17.8 mg/dL (ref 0.0–40.0)

## 2013-07-29 LAB — HEMOGLOBIN A1C: Hgb A1c MFr Bld: 6.3 % (ref 4.6–6.5)

## 2013-07-29 LAB — MICROALBUMIN / CREATININE URINE RATIO
Creatinine,U: 111.1 mg/dL
Microalb Creat Ratio: 0.6 mg/g (ref 0.0–30.0)
Microalb, Ur: 0.7 mg/dL (ref 0.0–1.9)

## 2013-07-29 LAB — TSH: TSH: 1.9 u[IU]/mL (ref 0.35–5.50)

## 2013-07-29 LAB — T4, FREE: Free T4: 0.78 ng/dL (ref 0.60–1.60)

## 2013-08-02 ENCOUNTER — Encounter: Payer: Self-pay | Admitting: Endocrinology

## 2013-08-02 ENCOUNTER — Ambulatory Visit (INDEPENDENT_AMBULATORY_CARE_PROVIDER_SITE_OTHER): Payer: Medicare PPO | Admitting: Endocrinology

## 2013-08-02 VITALS — BP 118/54 | HR 75 | Temp 98.1°F | Ht 67.0 in | Wt 244.3 lb

## 2013-08-02 DIAGNOSIS — E119 Type 2 diabetes mellitus without complications: Secondary | ICD-10-CM

## 2013-08-02 DIAGNOSIS — E042 Nontoxic multinodular goiter: Secondary | ICD-10-CM | POA: Insufficient documentation

## 2013-08-02 DIAGNOSIS — E7800 Pure hypercholesterolemia, unspecified: Secondary | ICD-10-CM

## 2013-08-02 DIAGNOSIS — I83813 Varicose veins of bilateral lower extremities with pain: Secondary | ICD-10-CM

## 2013-08-02 DIAGNOSIS — I83893 Varicose veins of bilateral lower extremities with other complications: Secondary | ICD-10-CM

## 2013-08-02 NOTE — Patient Instructions (Addendum)
Add 1/2 Metformin at supper  Please check blood sugars at least half the time about 2 hours after any meal and as directed on waking up. Please bring blood sugar monitor to each visit

## 2013-08-02 NOTE — Progress Notes (Signed)
Patient ID: Carolyn Lambert, female   DOB: [birth year], 72 y.o.   MRN: [id number]  Reason for Appointment: Diabetes follow-up   History of Present Illness   Diagnosis: date of diagnosis: 2004, Type 2 diabetes mellitus.   RECENT history:  She had been doing well with glucose better controlled with adding Victoza and average reading 123 She did not increase her Victoza to the 1.2 mg but her blood sugars are still fairly good. No side effects Previously her A1c was upper normal at 5.9 % done on 01/15/13 and she had difficulty losing weight She has started Victoza and tolerating this well since last month and initially lost  5 pounds Also taking metformin 1000 mg, this was reduced previously because of diarrhea  Complications: none. No retinopathy, peripheral neuropathy, nephropathy.  Monitors blood glucose: Less than once a day.  Glucometer: unsure.  Blood Glucose readings: Before breakfast: upto 153 from recall, lab glucose 110 Hypoglycemia frequency: Never.   Meals: 3 meals per day. Compliance with her diet has been variable; craving sweets, usually watching portions  Physical activity: exercise: 30 min with [employer]-step, 3/7 days per week usually. Limited by leg pain  Dietitian visit: Most recent: years ago.  Retinal exam: Most recent: in 1/14.   Previous history: She does not know how high her blood sugars were at diagnosis but apparently was started on metformin for treatment. She has been on this consistently since then. Subsequently she started watching her diet much better and exercising. She was able to lose a little  weight and blood sugars had improved.    Wt Readings from Last 3 Encounters:  08/02/13 244 lb 4.8 oz (110.814 kg)  05/30/13 242 lb 3.2 oz (109.861 kg)  05/27/13 244 lb (110.678 kg)    HYPERLIPIDEMIA: LDL  was  not at goal on pravastatin, on the last visit  was switched to Lipitor 20 mg.  LDL excellent now   LABS:  Lab Results  Component Value Date   HGBA1C 6.3  07/29/2013   HGBA1C 6.9* 04/16/2013   Lab Results  Component Value Date   MICROALBUR 0.7 07/29/2013   LDLCALC 79 07/29/2013   CREATININE 0.7 07/29/2013     Appointment on 07/29/2013  Component Date Value Range Status  . Cholesterol 07/29/2013 152  0 - 200 mg/dL Final   ATP III Classification       Desirable:  < 200 mg/dL               Borderline High:  200 - 239 mg/dL          High:  > = 284 mg/dL  . Triglycerides 07/29/2013 89.0  0.0 - 149.0 mg/dL Final   Normal:  <132 mg/dLBorderline High:  150 - 199 mg/dL  . HDL 07/29/2013 55.00  >39.00 mg/dL Final  . VLDL 44/10/270 17.8  0.0 - 40.0 mg/dL Final  . LDL Cholesterol 07/29/2013 79  0 - 99 mg/dL Final  . Total CHOL/HDL Ratio 07/29/2013 3   Final                  Men          Women1/2 Average Risk     3.4          3.3Average Risk          5.0          4.42X Average Risk          9.6  7.13X Average Risk          15.0          11.0                      . Free T4 07/29/2013 0.78  0.60 - 1.60 ng/dL Final  . TSH 09/81/1914 1.90  0.35 - 5.50 uIU/mL Final  . Sodium 07/29/2013 143  135 - 145 mEq/L Final  . Potassium 07/29/2013 4.8  3.5 - 5.1 mEq/L Final  . Chloride 07/29/2013 106  96 - 112 mEq/L Final  . CO2 07/29/2013 31  19 - 32 mEq/L Final  . Glucose, Bld 07/29/2013 110* 70 - 99 mg/dL Final  . BUN 78/29/5621 15  6 - 23 mg/dL Final  . Creatinine, Ser 07/29/2013 0.7  0.4 - 1.2 mg/dL Final  . Total Bilirubin 07/29/2013 0.6  0.3 - 1.2 mg/dL Final  . Alkaline Phosphatase 07/29/2013 66  39 - 117 U/L Final  . AST 07/29/2013 16  0 - 37 U/L Final  . ALT 07/29/2013 14  0 - 35 U/L Final  . Total Protein 07/29/2013 6.6  6.0 - 8.3 g/dL Final  . Albumin 30/86/5784 3.9  3.5 - 5.2 g/dL Final  . Calcium 69/62/9528 9.3  8.4 - 10.5 mg/dL Final  . GFR 41/32/4401 87.26  >60.00 mL/min Final  . Hemoglobin A1C 07/29/2013 6.3  4.6 - 6.5 % Final   Glycemic Control Guidelines for People with Diabetes:Non Diabetic:  <6%Goal of Therapy: <7%Additional  Action Suggested:  >8%   . Microalb, Ur 07/29/2013 0.7  0.0 - 1.9 mg/dL Final  . Creatinine,U 02/72/5366 111.1   Final  . Microalb Creat Ratio 07/29/2013 0.6  0.0 - 30.0 mg/g Final      Medication List       This list is accurate as of: 08/02/13  1:24 PM.  Always use your most recent med list.               atorvastatin 20 MG tablet  Commonly known as:  LIPITOR  Take 1 tablet (20 mg total) by mouth daily.     baclofen 10 MG tablet  Commonly known as:  LIORESAL  Take 100 mg by mouth 2 (two) times daily.     glucose blood test strip  Commonly known as:  ACCU-CHEK SMARTVIEW  Use to check blood sugars 2 times per day     guaiFENesin 600 MG 12 hr tablet  Commonly known as:  MUCINEX  Take 1,200 mg by mouth 2 (two) times daily.     HYDROcodone-acetaminophen 5-325 MG per tablet  Commonly known as:  NORCO/VICODIN  PRN     Liraglutide 18 MG/3ML Sopn  Inject 0.6 mg into the skin daily.     metFORMIN 1000 MG tablet  Commonly known as:  GLUCOPHAGE  Take 1 tablet (1,000 mg total) by mouth daily with breakfast.     sertraline 100 MG tablet  Commonly known as:  ZOLOFT  Take 2 tablets (200 mg total) by mouth daily.     traZODone 50 MG tablet  Commonly known as:  DESYREL  TAKE 1 TO 2 TABLETS BY MOUTH EVERY NIGHT AT BEDTIME AS NEEDED     WARFARIN SODIUM PO  Take 7.5 mg by mouth daily.        Allergies:  Allergies  Allergen Reactions  . Other     Pain medication- states causes n/v    Past Medical History  Diagnosis Date  .  Diabetes mellitus   . DVT (deep venous thrombosis)   . Anxiety and depression   . Fibromyalgia   . Chronic fatigue syndrome   . Peripheral edema   . Hypercholesteremia   . Major depressive disorder 03/20/2012  . Sinusitis July 2013    Past Surgical History  Procedure Laterality Date  . Abdominal hysterectomy    . Replacement total knee    . Back surgery    . Rotator cuff repair    . Tonsillectomy      Family History  Problem  Relation Age of Onset  . Hypertension Mother   . Atrial fibrillation Mother   . Cancer Father   . Diabetes type II Father   . Depression Daughter     Social History:  reports that she has never smoked. She does not have any smokeless tobacco history on file. She reports that she does not drink alcohol or use illicit drugs.  REVIEW of systems:  Multinodular Goiter:  She has had a multinodular goiter for several years. She was followed by an ENT surgeon but records are not available. She says her midline nodule had been stable in size and she was discharged from his care  She has a history of major depressive disorder  No history of hypertension   Examination:   BP 118/54  Pulse 75  Temp(Src) 98.1 F (36.7 C) (Oral)  Ht 5\' 7"  (1.702 m)  Wt 244 lb 4.8 oz (110.814 kg)  BMI 38.25 kg/m2  SpO2 98%  Body mass index is 38.25 kg/(m^2).   No  pedal edema present She has significant varicose veins of the lower legs  Assesment:   Diabetes type 2, uncontrolled - 250.02  The patient's diabetes control appears to be better with  adding Victoza  to low dose metformin which she is tolerating although her weight loss has started she is wanting to continue Victoza Encouraged her to check more readings after supper She will also try additional half tablet of metformin at suppertime to have fasting readings which are relatively higher   HYPERLIPIDEMIA:   well controlled. Will need to Continue on Lipitor .  Tendency to venous edema and leg pain, given prescription for elastic stockings   Eoghan Belcher 08/02/2013, 1:24 PM

## 2013-08-04 DIAGNOSIS — I83813 Varicose veins of bilateral lower extremities with pain: Secondary | ICD-10-CM | POA: Insufficient documentation

## 2013-08-27 ENCOUNTER — Encounter (HOSPITAL_COMMUNITY): Payer: Self-pay | Admitting: Psychiatry

## 2013-08-27 ENCOUNTER — Other Ambulatory Visit (HOSPITAL_COMMUNITY): Payer: Self-pay | Admitting: Psychiatry

## 2013-08-27 ENCOUNTER — Ambulatory Visit (INDEPENDENT_AMBULATORY_CARE_PROVIDER_SITE_OTHER): Payer: Medicare PPO | Admitting: Psychiatry

## 2013-08-27 VITALS — BP 144/82 | HR 76 | Ht 67.0 in | Wt 249.0 lb

## 2013-08-27 DIAGNOSIS — F4310 Post-traumatic stress disorder, unspecified: Secondary | ICD-10-CM

## 2013-08-27 DIAGNOSIS — F329 Major depressive disorder, single episode, unspecified: Secondary | ICD-10-CM

## 2013-08-27 DIAGNOSIS — F332 Major depressive disorder, recurrent severe without psychotic features: Secondary | ICD-10-CM

## 2013-08-27 MED ORDER — BUPROPION HCL 75 MG PO TABS: 75.0000 mg | ORAL_TABLET | Freq: Every morning | ORAL | Status: DC

## 2013-08-27 MED ORDER — SERTRALINE HCL 100 MG PO TABS: 200.0000 mg | ORAL_TABLET | Freq: Every day | ORAL | Status: DC
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
## 2013-09-02 ENCOUNTER — Other Ambulatory Visit (HOSPITAL_COMMUNITY): Payer: Self-pay | Admitting: Psychiatry

## 2013-09-19 ENCOUNTER — Telehealth (HOSPITAL_COMMUNITY): Payer: Self-pay

## 2013-09-20 NOTE — Telephone Encounter (Signed)
Called patient. She is doing well. Will continue Wellbutrin at 75 mg.

## 2013-10-11 ENCOUNTER — Ambulatory Visit (HOSPITAL_COMMUNITY): Payer: Self-pay | Admitting: Psychiatry

## 2013-10-11 ENCOUNTER — Ambulatory Visit (INDEPENDENT_AMBULATORY_CARE_PROVIDER_SITE_OTHER): Payer: Medicare PPO | Admitting: Physician Assistant

## 2013-10-11 ENCOUNTER — Encounter: Payer: Self-pay | Admitting: Physician Assistant

## 2013-10-11 VITALS — BP 149/71 | HR 67 | Wt 255.0 lb

## 2013-10-11 DIAGNOSIS — M216X9 Other acquired deformities of unspecified foot: Secondary | ICD-10-CM

## 2013-10-11 DIAGNOSIS — M21372 Foot drop, left foot: Secondary | ICD-10-CM

## 2013-10-11 DIAGNOSIS — Z86718 Personal history of other venous thrombosis and embolism: Secondary | ICD-10-CM | POA: Insufficient documentation

## 2013-10-11 DIAGNOSIS — I83813 Varicose veins of bilateral lower extremities with pain: Secondary | ICD-10-CM

## 2013-10-11 DIAGNOSIS — E7800 Pure hypercholesterolemia, unspecified: Secondary | ICD-10-CM

## 2013-10-11 DIAGNOSIS — I82402 Acute embolism and thrombosis of unspecified deep veins of left lower extremity: Secondary | ICD-10-CM

## 2013-10-11 DIAGNOSIS — F329 Major depressive disorder, single episode, unspecified: Secondary | ICD-10-CM

## 2013-10-11 DIAGNOSIS — I82409 Acute embolism and thrombosis of unspecified deep veins of unspecified lower extremity: Secondary | ICD-10-CM

## 2013-10-11 DIAGNOSIS — I83893 Varicose veins of bilateral lower extremities with other complications: Secondary | ICD-10-CM

## 2013-10-11 DIAGNOSIS — E119 Type 2 diabetes mellitus without complications: Secondary | ICD-10-CM

## 2013-10-11 LAB — POCT INR: INR: 2.5

## 2013-10-11 MED ORDER — TIZANIDINE HCL 2 MG PO CAPS: 2.0000 mg | ORAL_CAPSULE | Freq: Three times a day (TID) | ORAL | Status: DC

## 2013-10-14 DIAGNOSIS — M21372 Foot drop, left foot: Secondary | ICD-10-CM | POA: Insufficient documentation

## 2013-10-14 NOTE — Progress Notes (Signed)
   Subjective:    Patient ID: Carolyn Lambert, female    DOB: Apr 21, 1941, 73 y.o.   MRN: [id number]  HPI Patient is a 73 year old female who presents to the clinic to establish care.  .. Active Ambulatory Problems    Diagnosis Date Noted  . Major depressive disorder 03/20/2012  . Post traumatic stress disorder 03/20/2012  . Type II or unspecified type diabetes mellitus without mention of complication, not stated as uncontrolled 04/19/2013  . Pure hypercholesterolemia 04/19/2013  . Multinodular goiter 08/02/2013  . 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 DVT (deep venous thrombosis) 10/11/2013   Resolved Ambulatory Problems    Diagnosis Date Noted  . No Resolved Ambulatory Problems   Past Medical History  Diagnosis Date  . Diabetes mellitus   . Anxiety and depression   . Fibromyalgia   . Chronic fatigue syndrome   . Peripheral edema   . Hypercholesteremia   . Sinusitis July 2013   .Marland Kitchen Family History  Problem Relation Age of Onset  . Hypertension Mother   . Atrial fibrillation Mother   . Diabetes Mother   . Hyperlipidemia Mother   . Cancer Father   . Diabetes type II Father   . Depression Daughter    .Marland Kitchen History   Social History  . Marital Status: Married    Spouse Name: N/A    Number of Children: N/A  . Years of Education: N/A   Occupational History  . Not on file.   Social History Main Topics  . Smoking status: Never Smoker   . Smokeless tobacco: Not on file  . Alcohol Use: No     Comment: Last drink April 2013  . Drug Use: No  . Sexual Activity: Not Currently   Other Topics Concern  . Not on file   Social History Narrative  . No narrative on file   Patient has no current complaints today and does not need any medication refills.  DVT-patient has INR checked routinely. Patient continues on Coumadin daily.   Review of Systems     Objective:   Physical Exam  Constitutional: She is oriented to person, place, and time. She appears  well-developed and well-nourished.  Obese.   HENT:  Head: Normocephalic and atraumatic.  Cardiovascular: Normal rate, regular rhythm and normal heart sounds.   Pulmonary/Chest: Effort normal and breath sounds normal.  Neurological: She is alert and oriented to person, place, and time.  Skin: Skin is dry.  Psychiatric: She has a normal mood and affect. Her behavior is normal.          Assessment & Plan:  DVT, left leg- INR of 2.5 today. Looks good stay on same dose of Coumadin and followup in 4 weeks.  Diabetes mellitus type 2, uncontrolled- come back in one month for A1c recheck and we'll discuss management of diabetic medication.  Maj. depressive disorder/posttraumatic stress disorder-managed by Dr. Mamie Nick. down stairs. Has appointment for January 18.  Pure hypercholesterolemia-continue on Lipitor. It appears last recheck was recent and control.  Needs tdao,

## 2013-10-17 ENCOUNTER — Other Ambulatory Visit (HOSPITAL_COMMUNITY): Payer: Self-pay | Admitting: Psychiatry

## 2013-10-17 ENCOUNTER — Encounter (HOSPITAL_COMMUNITY): Payer: Self-pay | Admitting: Psychiatry

## 2013-10-17 ENCOUNTER — Ambulatory Visit (INDEPENDENT_AMBULATORY_CARE_PROVIDER_SITE_OTHER): Payer: Medicare PPO | Admitting: Psychiatry

## 2013-10-17 VITALS — BP 150/82 | HR 69 | Wt 253.0 lb

## 2013-10-17 DIAGNOSIS — F4310 Post-traumatic stress disorder, unspecified: Secondary | ICD-10-CM

## 2013-10-17 DIAGNOSIS — F329 Major depressive disorder, single episode, unspecified: Secondary | ICD-10-CM

## 2013-10-17 DIAGNOSIS — F332 Major depressive disorder, recurrent severe without psychotic features: Secondary | ICD-10-CM

## 2013-10-17 MED ORDER — TRAZODONE HCL 50 MG PO TABS: ORAL_TABLET | ORAL | Status: DC

## 2013-10-17 MED ORDER — BUPROPION HCL ER (XL) 150 MG PO TB24: 150.0000 mg | ORAL_TABLET | Freq: Every day | ORAL | Status: DC
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
## 2013-10-18 NOTE — Telephone Encounter (Signed)
Called patient. She did not request a 90 day supply.

## 2013-10-21 ENCOUNTER — Telehealth: Payer: Self-pay | Admitting: Physician Assistant

## 2013-10-21 NOTE — Telephone Encounter (Signed)
Patient called request to have an M.D female doctor and not a P.A. She states that she likes Luvenia Starch but prefer an M.D and female and wouldl ike to know if Dr. Madilyn Fireman will please accept her as a new patient. I advised pt that I would ask Dr. Madilyn Fireman and call her back. But I told pt that Dr. Ileene Rubens and Dr. Darene Lamer who are both M.D's are accepting new patient's. Thanks please advise

## 2013-10-21 NOTE — Telephone Encounter (Signed)
ok 

## 2013-10-21 NOTE — Telephone Encounter (Signed)
OK to switch to me 

## 2013-11-01 ENCOUNTER — Telehealth (HOSPITAL_COMMUNITY): Payer: Self-pay

## 2013-11-01 NOTE — Telephone Encounter (Signed)
Acknowledged.

## 2013-11-08 ENCOUNTER — Ambulatory Visit: Payer: Self-pay | Admitting: Physician Assistant

## 2013-11-08 ENCOUNTER — Encounter: Payer: Self-pay | Admitting: Family Medicine

## 2013-11-08 ENCOUNTER — Ambulatory Visit (INDEPENDENT_AMBULATORY_CARE_PROVIDER_SITE_OTHER): Payer: Medicare PPO | Admitting: Family Medicine

## 2013-11-08 ENCOUNTER — Ambulatory Visit: Payer: Self-pay | Admitting: Family Medicine

## 2013-11-08 VITALS — BP 114/63 | HR 79 | Temp 97.9°F | Ht 67.0 in | Wt 253.0 lb

## 2013-11-08 DIAGNOSIS — E7800 Pure hypercholesterolemia, unspecified: Secondary | ICD-10-CM

## 2013-11-08 DIAGNOSIS — I82409 Acute embolism and thrombosis of unspecified deep veins of unspecified lower extremity: Secondary | ICD-10-CM

## 2013-11-08 DIAGNOSIS — E669 Obesity, unspecified: Secondary | ICD-10-CM

## 2013-11-08 DIAGNOSIS — E119 Type 2 diabetes mellitus without complications: Secondary | ICD-10-CM

## 2013-11-08 DIAGNOSIS — E041 Nontoxic single thyroid nodule: Secondary | ICD-10-CM

## 2013-11-08 LAB — POCT GLYCOSYLATED HEMOGLOBIN (HGB A1C): Hemoglobin A1C: 6.1

## 2013-11-08 LAB — POCT INR: INR: 2.4

## 2013-11-08 NOTE — Patient Instructions (Signed)
See me back in 3 months for your diabetes.

## 2013-11-09 ENCOUNTER — Encounter: Payer: Self-pay | Admitting: Family Medicine

## 2013-11-09 DIAGNOSIS — E669 Obesity, unspecified: Secondary | ICD-10-CM | POA: Insufficient documentation

## 2013-11-09 NOTE — Progress Notes (Signed)
   Subjective:    Patient ID: Carolyn Lambert, female    DOB: 29-Jan-1941, 73 y.o.   MRN: [id number]  HPI Diabetes - no hypoglycemic events. No wounds or sores that are not healing well. No increased thirst or urination. Checking glucose at home. Taking medications as prescribed without any side effects.  Hyperlipidemia - tolerating statin well with no S.E.  Obesity - interested in weight loss. Says used to do shots with prior PCP.    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  No excessive bleeding.  Will set up her coumadin flowsheet.   Left pain and spasms- Says her insurance wouldn't cover the tizanidine so will still with the baclofen.  Review of Systems     Objective:   Physical Exam  Constitutional: She is oriented to person, place, and time. She appears well-developed and well-nourished.  HENT:  Head: Normocephalic and atraumatic.  Cardiovascular: Normal rate, regular rhythm and normal heart sounds.   Pulmonary/Chest: Effort normal and breath sounds normal.  Neurological: She is alert and oriented to person, place, and time.  Skin: Skin is warm and dry.  Psychiatric: She has a normal mood and affect. Her behavior is normal.          Assessment & Plan:  DM- well controlled. ON statin.  Not on ASA o rACE.  Hx of HTN or renal disease. F/U in 3 mo.  Lab Results  Component Value Date   HGBA1C 6.1 11/08/2013   Obesity - Discussed referral to our local bariatric clinic to See Dr. Valetta Close for medical management of her obesity. They have a comprehensive program.   Hyperlipidemia - lipoids are up to date. Continue regimen.  Lab Results  Component Value Date   CHOL 152 07/29/2013   HDL 55.00 07/29/2013   LDLCALC 79 07/29/2013   LDLDIRECT 124.0 04/16/2013   TRIG 89.0 07/29/2013   CHOLHDL 3 07/29/2013     DVT - See coumadin flowsheet for adjustments to coumadin.

## 2013-11-11 ENCOUNTER — Other Ambulatory Visit: Payer: Self-pay | Admitting: Family Medicine

## 2013-11-25 ENCOUNTER — Ambulatory Visit (HOSPITAL_COMMUNITY): Payer: Self-pay | Admitting: Psychiatry

## 2013-11-29 ENCOUNTER — Other Ambulatory Visit: Payer: Self-pay

## 2013-12-04 ENCOUNTER — Ambulatory Visit: Payer: Self-pay | Admitting: Endocrinology

## 2013-12-06 ENCOUNTER — Ambulatory Visit: Payer: Self-pay | Admitting: *Deleted

## 2013-12-09 ENCOUNTER — Ambulatory Visit (INDEPENDENT_AMBULATORY_CARE_PROVIDER_SITE_OTHER): Payer: Medicare PPO | Admitting: Physician Assistant

## 2013-12-09 VITALS — BP 116/53 | HR 53 | Wt 251.0 lb

## 2013-12-09 DIAGNOSIS — I82409 Acute embolism and thrombosis of unspecified deep veins of unspecified lower extremity: Secondary | ICD-10-CM

## 2013-12-09 LAB — POCT INR: INR: 2.9

## 2013-12-09 NOTE — Progress Notes (Addendum)
Pt here for INR check no missed doses, diet changes,bruising,bleeding,CP,SOB.Carolyn Lambert   Stay at same dose. High normal today but still normal. Not changed dose in 2 months. Perhaps some diet changes is to blame from increase from 2.4. Recheck in 1 month. Jade Breeback PA-C.

## 2013-12-13 ENCOUNTER — Other Ambulatory Visit (HOSPITAL_COMMUNITY): Payer: Self-pay | Admitting: Psychiatry

## 2013-12-15 NOTE — Telephone Encounter (Signed)
Refill request completed.

## 2014-01-03 ENCOUNTER — Other Ambulatory Visit (HOSPITAL_COMMUNITY): Payer: Self-pay | Admitting: Psychiatry

## 2014-01-03 DIAGNOSIS — F332 Major depressive disorder, recurrent severe without psychotic features: Secondary | ICD-10-CM

## 2014-01-03 DIAGNOSIS — F329 Major depressive disorder, single episode, unspecified: Secondary | ICD-10-CM

## 2014-01-03 DIAGNOSIS — F4310 Post-traumatic stress disorder, unspecified: Secondary | ICD-10-CM

## 2014-01-04 NOTE — Telephone Encounter (Signed)
Refilled wellbutrin.  

## 2014-01-08 ENCOUNTER — Encounter: Payer: Self-pay | Admitting: *Deleted

## 2014-01-08 ENCOUNTER — Ambulatory Visit: Payer: Self-pay | Admitting: Family Medicine

## 2014-01-08 ENCOUNTER — Telehealth: Payer: Self-pay | Admitting: *Deleted

## 2014-01-08 ENCOUNTER — Ambulatory Visit (INDEPENDENT_AMBULATORY_CARE_PROVIDER_SITE_OTHER): Payer: Medicare PPO | Admitting: Family Medicine

## 2014-01-08 VITALS — BP 145/85 | HR 72 | Wt 256.0 lb

## 2014-01-08 DIAGNOSIS — I82409 Acute embolism and thrombosis of unspecified deep veins of unspecified lower extremity: Secondary | ICD-10-CM

## 2014-01-08 LAB — POCT INR: INR: 1.3

## 2014-01-08 NOTE — Telephone Encounter (Signed)
Increase dose. 2 tabs, which is 10 mg, on Monday, Wednesday, Thursday, Saturday. One and a half tabs, which is 7.5 mg, on Sunday, Tuesday, and Saturday. Recheck in 2 weeks. Left message on pt's vm per her request.Leah Thornberry, [place] Crocker

## 2014-01-08 NOTE — Progress Notes (Signed)
.  Pt here for INR check no missed doses, diet changes,bruising,bleeding,CP,SOB.Carolyn Lambert   

## 2014-01-15 ENCOUNTER — Other Ambulatory Visit (HOSPITAL_COMMUNITY): Payer: Self-pay | Admitting: Psychiatry

## 2014-01-15 ENCOUNTER — Ambulatory Visit (INDEPENDENT_AMBULATORY_CARE_PROVIDER_SITE_OTHER): Payer: Medicare PPO | Admitting: Psychiatry

## 2014-01-15 ENCOUNTER — Encounter (HOSPITAL_COMMUNITY): Payer: Self-pay | Admitting: Psychiatry

## 2014-01-15 VITALS — BP 139/81 | HR 81 | Wt 250.0 lb

## 2014-01-15 DIAGNOSIS — F332 Major depressive disorder, recurrent severe without psychotic features: Secondary | ICD-10-CM

## 2014-01-15 DIAGNOSIS — F4310 Post-traumatic stress disorder, unspecified: Secondary | ICD-10-CM

## 2014-01-15 MED ORDER — TRAZODONE HCL 50 MG PO TABS: ORAL_TABLET | ORAL | Status: DC

## 2014-01-15 MED ORDER — SERTRALINE HCL 100 MG PO TABS: 200.0000 mg | ORAL_TABLET | Freq: Every day | ORAL | Status: DC

## 2014-01-15 MED ORDER — BUPROPION HCL ER (XL) 150 MG PO TB24: 150.0000 mg | ORAL_TABLET | Freq: Two times a day (BID) | ORAL | Status: DC

## 2014-01-15 NOTE — Telephone Encounter (Signed)
Refill provided
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
## 2014-01-15 NOTE — Telephone Encounter (Signed)
Approved 90 day supply.

## 2014-01-16 LAB — TSH: TSH: 3.708 u[IU]/mL (ref 0.350–4.500)

## 2014-01-16 LAB — T4, FREE: Free T4: 1.05 ng/dL (ref 0.80–1.80)

## 2014-01-16 LAB — T3, FREE: T3, Free: 2.4 pg/mL (ref 2.3–4.2)

## 2014-01-20 ENCOUNTER — Telehealth (HOSPITAL_COMMUNITY): Payer: Self-pay

## 2014-01-20 NOTE — Telephone Encounter (Signed)
Called patient. She had her medications approved.

## 2014-01-21 ENCOUNTER — Ambulatory Visit: Payer: Self-pay

## 2014-01-23 ENCOUNTER — Other Ambulatory Visit: Payer: Self-pay | Admitting: Family Medicine

## 2014-01-29 ENCOUNTER — Other Ambulatory Visit: Payer: Self-pay | Admitting: Family Medicine

## 2014-01-30 ENCOUNTER — Ambulatory Visit (INDEPENDENT_AMBULATORY_CARE_PROVIDER_SITE_OTHER): Payer: Medicare PPO | Admitting: Family Medicine

## 2014-01-30 DIAGNOSIS — I82409 Acute embolism and thrombosis of unspecified deep veins of unspecified lower extremity: Secondary | ICD-10-CM

## 2014-01-30 LAB — POCT INR: INR: 2.5

## 2014-01-30 NOTE — Progress Notes (Signed)
Message left on vm with instructions.

## 2014-02-04 ENCOUNTER — Encounter: Payer: Self-pay | Admitting: Family Medicine

## 2014-02-04 ENCOUNTER — Ambulatory Visit (INDEPENDENT_AMBULATORY_CARE_PROVIDER_SITE_OTHER): Payer: Medicare PPO | Admitting: Family Medicine

## 2014-02-04 VITALS — BP 188/84 | HR 95 | Wt 252.0 lb

## 2014-02-04 DIAGNOSIS — E119 Type 2 diabetes mellitus without complications: Secondary | ICD-10-CM

## 2014-02-04 DIAGNOSIS — M25511 Pain in right shoulder: Secondary | ICD-10-CM

## 2014-02-04 DIAGNOSIS — M899 Disorder of bone, unspecified: Secondary | ICD-10-CM

## 2014-02-04 DIAGNOSIS — M898X1 Other specified disorders of bone, shoulder: Secondary | ICD-10-CM

## 2014-02-04 DIAGNOSIS — M25519 Pain in unspecified shoulder: Secondary | ICD-10-CM

## 2014-02-04 DIAGNOSIS — M949 Disorder of cartilage, unspecified: Secondary | ICD-10-CM

## 2014-02-04 LAB — POCT UA - MICROALBUMIN
Albumin/Creatinine Ratio, Urine, POC: <30
Creatinine, POC: 200 mg/dL
Microalbumin Ur, POC: 30 mg/L

## 2014-02-04 LAB — POCT GLYCOSYLATED HEMOGLOBIN (HGB A1C): Hemoglobin A1C: 6.1

## 2014-02-04 NOTE — Progress Notes (Signed)
Subjective:    Patient ID: Carolyn Lambert, female    DOB: [birth year], 73 y.o.   MRN: [id number]  HPI Diabetes - no hypoglycemic events. No wounds or sores that are not healing well. No increased thirst or urination. Checking glucose at home. Taking medications as prescribed without any side effects.  Fell on her right shoulder when fell off a step ladder.  [facility]ent to [facility] and had a negative xray that was neg for fracture. She says it is getting some better. Says gets tender over the shoulder blade.  Ice helps. Heat doesn't help.  No limitation of range of motion. No neck pain.  Hx of rotator cuff surgery. No worsening factors. Has not limited her range of motion.  Review of Systems  BP 188/84  Pulse 95  Wt 252 lb (114.306 kg)    Allergies  Allergen Reactions  . Other     Pain medication- states causes n/v    Past Medical History  Diagnosis Date  . Diabetes mellitus   . DVT (deep venous thrombosis)   . Anxiety and depression   . Fibromyalgia   . Chronic fatigue syndrome   . Peripheral edema   . Hypercholesteremia   . Major depressive disorder 03/20/2012  . Sinusitis July 2013    Past Surgical History  Procedure Laterality Date  . Abdominal hysterectomy    . Replacement total knee    . Back surgery    . Rotator cuff repair    . Tonsillectomy      History   Social History  . Marital Status: Married    Spouse Name: N/A    Number of Children: N/A  . Years of Education: N/A   Occupational History  . Not on file.   Social History Main Topics  . Smoking status: Never Smoker   . Smokeless tobacco: Not on file  . Alcohol Use: No     Comment: Last drink April 2013  . Drug Use: No     Comment: Caffeine: Coffee -2-3  cups per day.   Marland Kitchen Sexual Activity: Not Currently   Other Topics Concern  . Not on file   Social History Narrative  . No narrative on file    Family History  Problem Relation Age of Onset  . Hypertension Mother   . Atrial fibrillation  Mother   . Diabetes Mother   . Hyperlipidemia Mother   . Cancer Father   . Diabetes type II Father   . Depression Daughter     Outpatient Encounter Prescriptions as of 02/04/2014  Medication Sig  . atorvastatin (LIPITOR) 20 MG tablet TAKE 1 TABLET BY MOUTH EVERY DAY  . baclofen (LIORESAL) 10 MG tablet TAKE 1 TABLET BY MOUTH TWICE DAILY  . buPROPion (WELLBUTRIN XL) 150 MG 24 hr tablet Take 1 tablet (150 mg total) by mouth 2 (two) times daily. Take at 8 AM and 3 PM  . glucose blood (ACCU-CHEK SMARTVIEW) test strip Use to check blood sugars 2 times per day  . guaiFENesin (MUCINEX) 600 MG 12 hr tablet Take 1,200 mg by mouth 2 (two) times daily.  Marland Kitchen HYDROcodone-acetaminophen (NORCO/VICODIN) 5-325 MG per tablet PRN  . metFORMIN (GLUCOPHAGE) 1000 MG tablet Take 1,000 mg by mouth 2 (two) times daily.  . sertraline (ZOLOFT) 100 MG tablet TAKE 2 TABLETS BY MOUTH DAILY  . traZODone (DESYREL) 50 MG tablet prn  . WARFARIN SODIUM PO Take 7.5 mg by mouth daily.   . [DISCONTINUED] traZODone (DESYREL) 50 MG  tablet TAKE 1/2-2 TABLETS BY MOUTH DAILY          Objective:   Physical Exam  Constitutional: She is oriented to person, place, and time. She appears well-developed and well-nourished.  HENT:  Head: Normocephalic and atraumatic.  Cardiovascular: Normal rate, regular rhythm and normal heart sounds.   Pulmonary/Chest: Effort normal and breath sounds normal.  Musculoskeletal:  Right shoulder with normal range of motion and strength. Negative empty can test. Shoulder, elbow, wrist is 5 out of 5 bilaterally as are strength is concerned. Tender tender over the entire scapula and between the scapula and the spine. Nontender over the clavicle or a.c. joint.  Neurological: She is alert and oriented to person, place, and time.  Skin: Skin is warm and dry.  Psychiatric: She has a normal mood and affect. Her behavior is normal.          Assessment & Plan:  DM- well controlled. Continue current  regimen.  Eye was yesterday. We will call for records. Diabetic foot exam performed today. She's currently on a statin. On ACE inhibitor or aspirin at this time. Lab Results  Component Value Date   HGBA1C 6.1 02/04/2014    Right shoulder pain- Recommend trial of exercises. Most of her pain is actually over the scapula which is musculoskeletal. It actually tender on exam but is not bony tenderness. It seems to occur suddenly and then seems to improve on its own. I think is consistent with muscle spasms. Already on baclofen.  Use ice and call if not getting better.  Can refer to sport medicine.    Elevated blood pressure-we'll recheck at followup. Blood pressure was well-controlled in March and April. Not sure why so elevated today. We will check a followup before starting any medications.

## 2014-02-06 ENCOUNTER — Encounter: Payer: Self-pay | Admitting: Family Medicine

## 2014-02-06 DIAGNOSIS — H269 Unspecified cataract: Secondary | ICD-10-CM | POA: Insufficient documentation

## 2014-02-06 DIAGNOSIS — H04129 Dry eye syndrome of unspecified lacrimal gland: Secondary | ICD-10-CM | POA: Insufficient documentation

## 2014-02-06 DIAGNOSIS — H35369 Drusen (degenerative) of macula, unspecified eye: Secondary | ICD-10-CM | POA: Insufficient documentation

## 2014-02-07 LAB — BASIC METABOLIC PANEL WITH GFR
BUN: 11 mg/dL (ref 6–23)
CO2: 24 mEq/L (ref 19–32)
Calcium: 9.8 mg/dL (ref 8.4–10.5)
Chloride: 103 mEq/L (ref 96–112)
Creat: 0.76 mg/dL (ref 0.50–1.10)
GFR, Est African American: >89 mL/min
GFR, Est Non African American: 78 mL/min
Glucose, Bld: 192 mg/dL — ABNORMAL HIGH (ref 70–99)
Potassium: 5.1 mEq/L (ref 3.5–5.3)
Sodium: 143 mEq/L (ref 135–145)

## 2014-02-07 NOTE — Progress Notes (Signed)
Quick Note:  All labs are normal. ______ 

## 2014-03-05 ENCOUNTER — Ambulatory Visit (INDEPENDENT_AMBULATORY_CARE_PROVIDER_SITE_OTHER): Payer: Medicare PPO | Admitting: Family Medicine

## 2014-03-05 ENCOUNTER — Encounter: Payer: Self-pay | Admitting: *Deleted

## 2014-03-05 ENCOUNTER — Telehealth: Payer: Self-pay | Admitting: *Deleted

## 2014-03-05 VITALS — BP 142/68 | HR 79

## 2014-03-05 DIAGNOSIS — I82409 Acute embolism and thrombosis of unspecified deep veins of unspecified lower extremity: Secondary | ICD-10-CM

## 2014-03-05 LAB — POCT INR: INR: 2.9

## 2014-03-05 NOTE — Telephone Encounter (Signed)
Pt informed of dosing instructions.Carolyn Lambert

## 2014-03-05 NOTE — Progress Notes (Signed)
Pt here for INR check no missed doses, diet changes,bruising,bleeding,CP,SOB.Teddy Spike

## 2014-03-07 ENCOUNTER — Other Ambulatory Visit: Payer: Self-pay | Admitting: Family Medicine

## 2014-03-24 ENCOUNTER — Other Ambulatory Visit: Payer: Self-pay | Admitting: Family Medicine

## 2014-03-25 ENCOUNTER — Other Ambulatory Visit: Payer: Self-pay | Admitting: Family Medicine

## 2014-03-26 ENCOUNTER — Ambulatory Visit: Payer: Self-pay | Admitting: *Deleted

## 2014-04-02 ENCOUNTER — Ambulatory Visit (INDEPENDENT_AMBULATORY_CARE_PROVIDER_SITE_OTHER): Payer: Medicare PPO | Admitting: Family Medicine

## 2014-04-02 DIAGNOSIS — I82409 Acute embolism and thrombosis of unspecified deep veins of unspecified lower extremity: Secondary | ICD-10-CM

## 2014-04-02 LAB — POCT INR: INR: 2.5

## 2014-04-09 ENCOUNTER — Other Ambulatory Visit: Payer: Self-pay | Admitting: Family Medicine

## 2014-04-10 ENCOUNTER — Other Ambulatory Visit: Payer: Self-pay | Admitting: Family Medicine

## 2014-04-24 ENCOUNTER — Ambulatory Visit: Payer: Self-pay

## 2014-05-06 ENCOUNTER — Other Ambulatory Visit: Payer: Self-pay | Admitting: Family Medicine

## 2014-05-06 ENCOUNTER — Ambulatory Visit: Payer: Self-pay | Admitting: Family Medicine

## 2014-05-06 ENCOUNTER — Encounter: Payer: Self-pay | Admitting: Family Medicine

## 2014-05-06 ENCOUNTER — Ambulatory Visit (INDEPENDENT_AMBULATORY_CARE_PROVIDER_SITE_OTHER): Payer: Medicare PPO | Admitting: Family Medicine

## 2014-05-06 VITALS — BP 133/65 | HR 72 | Wt 254.0 lb

## 2014-05-06 DIAGNOSIS — G8929 Other chronic pain: Secondary | ICD-10-CM

## 2014-05-06 DIAGNOSIS — M47816 Spondylosis without myelopathy or radiculopathy, lumbar region: Secondary | ICD-10-CM | POA: Insufficient documentation

## 2014-05-06 DIAGNOSIS — M545 Low back pain, unspecified: Secondary | ICD-10-CM

## 2014-05-06 DIAGNOSIS — E119 Type 2 diabetes mellitus without complications: Secondary | ICD-10-CM

## 2014-05-06 DIAGNOSIS — F332 Major depressive disorder, recurrent severe without psychotic features: Secondary | ICD-10-CM

## 2014-05-06 DIAGNOSIS — I82409 Acute embolism and thrombosis of unspecified deep veins of unspecified lower extremity: Secondary | ICD-10-CM

## 2014-05-06 LAB — POCT INR: INR: 2.4

## 2014-05-06 LAB — POCT GLYCOSYLATED HEMOGLOBIN (HGB A1C): Hemoglobin A1C: 6.1

## 2014-05-06 MED ORDER — GABAPENTIN 100 MG PO CAPS: ORAL_CAPSULE | ORAL | Status: DC

## 2014-05-06 NOTE — Progress Notes (Signed)
   Subjective:    Patient ID: Carolyn Lambert, female    DOB: 1940/12/18, 73 y.o.   MRN: [id number]  HPI Depression - was on elavil for years. Now on sertraline and wellubtrin.  She was previously seen by Dr. Loni Dolly but he has changed practices but is still in the local area. She has not called to make an appointment with him at his new office yet. She says she feels down for at least 2-3 weeks out of each month. She's been taking her medication consistently. She feels down depressed and hopeless newly every day. No thoughts of suicide.  Having a lot of back pain and leg pain.  Hx of DDD of lumbar spine. Hx of 2 prior surgeries. CC [place] from last night because she couldn't get to sleep. She wonders if she would be a candidate for gabapentin.  Diabetes - no hypoglycemic events. No wounds or sores that are not healing well. No increased thirst or urination. Checking glucose at home. Taking medications as prescribed without any side effects.   Review of Systems     Objective:   Physical Exam  Constitutional: She is oriented to person, place, and time. She appears well-developed and well-nourished.  HENT:  Head: Normocephalic and atraumatic.  Cardiovascular: Normal rate, regular rhythm and normal heart sounds.   Pulmonary/Chest: Effort normal and breath sounds normal.  Neurological: She is alert and oriented to person, place, and time.  Skin: Skin is warm and dry.  Psychiatric: She has a normal mood and affect. Her behavior is normal.          Assessment & Plan:  DM- well controlled.  Continue current regimen of metformin. She is on Lipitor.  Depression- PHQ-9  Is 18 today. At this point I really recommend changing her treatment. I don't think what she is taking the sertraline Wellbutrin is working well for her. She says she feels down depressed at least 2-3 weeks out of the month. She says she prefers to try get back in with her psychiatrist Dr. Loni Dolly. I discussed with her  that if he is unavailable for the next month or so then I strongly encouraged her to call me back and we can try to make some changes in the interim until she gets in with him.  DVT -see Coumadin flow sheet for adjustments.  Chronic back pain-I. think she'll be a good candidate for gabapentin. We will start medication. Warned about potential side effects including sedation. She will followup in 4-6 weeks to see how she's tolerating it and to adjust her regimen as needed.

## 2014-05-20 ENCOUNTER — Emergency Department (INDEPENDENT_AMBULATORY_CARE_PROVIDER_SITE_OTHER): Payer: Medicare PPO

## 2014-05-20 ENCOUNTER — Encounter: Payer: Self-pay | Admitting: Emergency Medicine

## 2014-05-20 ENCOUNTER — Emergency Department
Admission: EM | Admit: 2014-05-20 | Discharge: 2014-05-20 | Disposition: A | Discharge status: Home / Self Care | Payer: Medicare PPO | Source: Home / Self Care | Attending: Family Medicine | Admitting: Family Medicine

## 2014-05-20 DIAGNOSIS — S2239XA Fracture of one rib, unspecified side, initial encounter for closed fracture: Secondary | ICD-10-CM

## 2014-05-20 DIAGNOSIS — M549 Dorsalgia, unspecified: Secondary | ICD-10-CM

## 2014-05-20 DIAGNOSIS — M542 Cervicalgia: Secondary | ICD-10-CM

## 2014-05-20 DIAGNOSIS — S2232XA Fracture of one rib, left side, initial encounter for closed fracture: Secondary | ICD-10-CM

## 2014-05-20 DIAGNOSIS — S139XXA Sprain of joints and ligaments of unspecified parts of neck, initial encounter: Secondary | ICD-10-CM

## 2014-05-20 DIAGNOSIS — X58XXXA Exposure to other specified factors, initial encounter: Secondary | ICD-10-CM

## 2014-05-20 MED ORDER — HYDROCODONE-ACETAMINOPHEN 5-325 MG PO TABS: 1.0000 | ORAL_TABLET | Freq: Every evening | ORAL | Status: DC | PRN

## 2014-05-20 NOTE — ED Notes (Signed)
Fell 2 days ago from standing position and now left side ribs from waist up to neck hurting; no pain meds left at home.

## 2014-05-20 NOTE — ED Provider Notes (Signed)
CSN: [id number]     Arrival date & time 05/20/14  1213 History   First MD Initiated Contact with Patient 05/20/14 1239     Chief Complaint  Patient presents with  . Back Pain      HPI Comments: Patient reports that she was working in her son's garden two days ago when she tripped over some vines and hit her left chest.  No loss of consciousness or subsequent neurologic symptoms.  She initially had soreness in her left chest, and yesterday she had soreness above her left shoulder.  Today she noticed soreness in her left neck.  No radicular pain in her left arm.  No shortness of breath.  Her chest pain is worse when raising her left arm  Patient is a 73 y.o. female presenting with chest pain. The history is provided by the patient.  Chest Pain Pain location:  L lateral chest Pain quality: aching and sharp   Pain radiates to:  L shoulder Pain radiates to the back: no   Pain severity:  Moderate Onset quality:  Gradual Duration:  2 days Timing:  Constant Progression:  Worsening Chronicity:  New Context: breathing and raising an arm   Relieved by:  Nothing Worsened by:  Coughing, movement, deep breathing and certain positions Ineffective treatments: ice alternating with heat. Associated symptoms: shortness of breath   Associated symptoms: no abdominal pain, no back pain, no cough, no diaphoresis, no dizziness, no fever, no nausea, no numbness and no palpitations     Past Medical History  Diagnosis Date  . Diabetes mellitus   . DVT (deep venous thrombosis)   . Anxiety and depression   . Fibromyalgia   . Chronic fatigue syndrome   . Peripheral edema   . Hypercholesteremia   . Major depressive disorder 03/20/2012  . Sinusitis July 2013   Past Surgical History  Procedure Laterality Date  . Abdominal hysterectomy    . Replacement total knee    . Back surgery    . Rotator cuff repair    . Tonsillectomy     Family History  Problem Relation Age of Onset  . Hypertension Mother   .  Atrial fibrillation Mother   . Diabetes Mother   . Hyperlipidemia Mother   . Cancer Father   . Diabetes type II Father   . Depression Daughter    History  Substance Use Topics  . Smoking status: Never Smoker   . Smokeless tobacco: Not on file  . Alcohol Use: No     Comment: Last drink April 2013   OB History   Grav Para Term Preterm Abortions TAB SAB Ect Mult Living                 Review of Systems  Constitutional: Negative for fever and diaphoresis.  Respiratory: Positive for shortness of breath. Negative for cough.   Cardiovascular: Positive for chest pain. Negative for palpitations.  Gastrointestinal: Negative for nausea and abdominal pain.  Musculoskeletal: Negative for back pain.  Neurological: Negative for dizziness and numbness.    Allergies  Other  Home Medications   Prior to Admission medications   Medication Sig Start Date End Date Taking? Authorizing Provider  atorvastatin (LIPITOR) 20 MG tablet TAKE 1 TABLET BY MOUTH DAILY 03/25/14   Hali Marry, MD  baclofen (LIORESAL) 10 MG tablet TAKE 1 TABLET BY MOUTH TWICE DAILY AS DIRECTED 05/06/14   Hali Marry, MD  buPROPion (WELLBUTRIN XL) 150 MG 24 hr tablet Take 1 tablet (  150 mg total) by mouth 2 (two) times daily. Take at 8 AM and 3 PM 01/15/14   Waldon Merl, MD  gabapentin (NEURONTIN) 100 MG capsule 1 tabs at beditme x 3 days, then can increase to 2 at bedtime x 3 days then 2 at bedtime and 1 in aM. 05/06/14   Hali Marry, MD  glucose blood (ACCU-CHEK SMARTVIEW) test strip Use to check blood sugars 2 times per day 05/22/13   Elayne Snare, MD  guaiFENesin (MUCINEX) 600 MG 12 hr tablet Take 1,200 mg by mouth 2 (two) times daily.    Historical Provider, MD  HYDROcodone-acetaminophen (NORCO/VICODIN) 5-325 MG per tablet Take 1 tablet by mouth at bedtime as needed for moderate pain. PRN 05/20/14   Kandra Nicolas, MD  metFORMIN (GLUCOPHAGE) 1000 MG tablet TAKE 1 TABLET BY MOUTH TWICE DAILY 04/10/14    Hali Marry, MD  sertraline (ZOLOFT) 100 MG tablet TAKE 2 TABLETS BY MOUTH DAILY 01/15/14   Waldon Merl, MD  traZODone (DESYREL) 50 MG tablet prn 01/15/14   Waldon Merl, MD  WARFARIN SODIUM PO Take 7.5 mg by mouth daily.     Historical Provider, MD   BP 142/81  Pulse 68  Temp(Src) 98 F (36.7 C) (Oral)  Resp 16  Ht 5\' 7"  (1.702 m)  Wt 250 lb (113.399 kg)  BMI 39.15 kg/m2  SpO2 98% Physical Exam  Nursing note and vitals reviewed. Constitutional: She is oriented to person, place, and time. She appears well-developed and well-nourished. No distress.  Patient is obese (BMI 39.2)  HENT:  Head: Atraumatic.  Mouth/Throat: Oropharynx is clear and moist.  Eyes: Conjunctivae and EOM are normal. Pupils are equal, round, and reactive to light.  Neck: Neck supple. Muscular tenderness present. Decreased range of motion present.    Left neck reveals tenderness over trapezius and sternocleidomastoid muscles, as well as tenderness posteriorly.  Cardiovascular: Normal heart sounds.   Pulmonary/Chest: Breath sounds normal. No respiratory distress.      Chest has tenderness over left lateral ribs as noted on diagram.  No ecchymosis, swelling, or crepitance.    Musculoskeletal: She exhibits no edema.  Lymphadenopathy:    She has no cervical adenopathy.  Neurological: She is alert and oriented to person, place, and time.  Skin: Skin is warm and dry. No rash noted.    ED Course  Procedures  none     Imaging Review Dg Ribs Unilateral W/chest Left  05/20/2014   CLINICAL DATA:  Left rib pain after a fall 2 days ago.  EXAM: LEFT RIBS AND CHEST - 3+ VIEW  COMPARISON:  PA and lateral chest 09/16/2010.  FINDINGS: The lungs are clear. Heart size is normal. No pneumothorax or pleural effusion. Postoperative change right shoulder is noted. There is a nondisplaced fracture of the left seventh rib which appears acute.  IMPRESSION: Nondisplaced left seventh rib fracture.  Negative for  pneumothorax.   Electronically Signed   By: Inge Rise M.D.   On: 05/20/2014 13:38   Dg Cervical Spine Complete  05/20/2014   CLINICAL DATA:  Pain post trauma  EXAM: CERVICAL SPINE  4+ VIEWS  COMPARISON:  None.  FINDINGS: Frontal, lateral, open-mouth odontoid, and bilateral oblique views were obtained. There is a lucency in the inferior most aspect of the odontoid which is somewhat oblique in orientation. This lucency raises concern for potential nondisplaced type III odontoid fracture. There is no displacement of bone in this area. No other evidence of potential fracture.  No spondylolisthesis. Prevertebral soft tissues and predental space regions are normal. There is moderate disc space narrowing at C4-5 and C5-6. There is mild disc space narrowing at C6-7. There are anterior osteophytes at C4, C5, and C6. There is exit foraminal narrowing at C4-5, C5-6, and C6-7 bilaterally due to osteoarthritic change.  IMPRESSION: Question nondisplaced type III odontoid fracture on the open-mouth odontoid image. No other potential fracture appreciable. This finding does warrant correlation with a cervical spine CT to further assess.  Moderate osteoarthritic change.  Critical Value/emergent results were called by telephone at the time of interpretation on 05/20/2014 at 1:40 pm to Dr. Theone Murdoch , who verbally acknowledged these results.   Electronically Signed   By: Lowella Grip M.D.   On: 05/20/2014 13:42   Ct Cervical Spine Wo Contrast  05/20/2014   CLINICAL DATA:  Status post fall 2 days ago.  Neck pain.  EXAM: CT CERVICAL SPINE WITHOUT CONTRAST  TECHNIQUE: Multidetector CT imaging of the cervical spine was performed without intravenous contrast. Multiplanar CT image reconstructions were also generated.  COMPARISON:  Plain film cervical spine earlier this same date. Thyroid ultrasound 06/05/2008.  FINDINGS: There is no fracture. Specifically, there is no type 3 dens fracture. Vertebral body height and  alignment are normal. Loss of disc space height is seen and most notable at C5-6. Lung apices are clear. Paraspinous structures demonstrate a 1.7 x 1.2 cm hypo attenuating lesion in the isthmus of the thyroid and correlates with cystic lesion seen on prior ultrasound, unchanged.  IMPRESSION: No acute finding.   Electronically Signed   By: Inge Rise M.D.   On: 05/20/2014 14:21     MDM   1. Left rib fracture, closed, initial encounter   2. Cervical sprain, initial encounter    Cervical collar applied; wear for 5 to 7 days, then begin neck exercises ([facility] information and instruction handout given)  Wear rib belt daytime for 5 to 7 days; discontinue if cough or cold develops.  Apply ice pack for 20 to 30 minutes, 3 to 4 times daily for 3 to 4 days.  Recommend taking Vitamin D and calcium supplement. Followup with Family Doctor if not improved in one week.   Recommend checking vitamin D level.    Kandra Nicolas, MD 05/20/14 1450

## 2014-05-20 NOTE — Discharge Instructions (Signed)
Wear rib belt daytime for 5 to 7 days; discontinue if cough or cold develops.  Apply ice pack for 20 to 30 minutes, 3 to 4 times daily for 3 to 4 days.  Recommend taking Vitamin D and calcium supplement    Cervical Sprain A cervical sprain is an injury in the neck in which the strong, fibrous tissues (ligaments) that connect your neck bones stretch or tear. Cervical sprains can range from mild to severe. Severe cervical sprains can cause the neck vertebrae to be unstable. This can lead to damage of the spinal cord and can result in serious nervous system problems. The amount of time it takes for a cervical sprain to get better depends on the cause and extent of the injury. Most cervical sprains heal in 1 to 3 weeks. CAUSES  Severe cervical sprains may be caused by:   Contact sport injuries (such as from football, rugby, wrestling, hockey, auto racing, gymnastics, diving, martial arts, or boxing).   Motor vehicle collisions.   Whiplash injuries. This is an injury from a sudden forward and backward whipping movement of the head and neck.  Falls.  Mild cervical sprains may be caused by:   Being in an awkward position, such as while cradling a telephone between your ear and shoulder.   Sitting in a chair that does not offer proper support.   Working at a poorly Landscape [employer] station.   Looking up or down for long periods of time.  SYMPTOMS   Pain, soreness, stiffness, or a burning sensation in the front, back, or sides of the neck. This discomfort may develop immediately after the injury or slowly, 24 hours or more after the injury.   Pain or tenderness directly in the middle of the back of the neck.   Shoulder or upper back pain.   Limited ability to move the neck.   Headache.   Dizziness.   Weakness, numbness, or tingling in the hands or arms.   Muscle spasms.   Difficulty swallowing or chewing.   Tenderness and swelling of the neck.  DIAGNOSIS  Most  of the time your health care provider can diagnose a cervical sprain by taking your history and doing a physical exam. Your health care provider will ask about previous neck injuries and any known neck problems, such as arthritis in the neck. X-rays may be taken to find out if there are any other problems, such as with the bones of the neck. Other tests, such as a CT scan or MRI, may also be needed.  TREATMENT  Treatment depends on the severity of the cervical sprain. Mild sprains can be treated with rest, keeping the neck in place (immobilization), and pain medicines. Severe cervical sprains are immediately immobilized. Further treatment is done to help with pain, muscle spasms, and other symptoms and may include:  Medicines, such as pain relievers, numbing medicines, or muscle relaxants.   Physical therapy. This may involve stretching exercises, strengthening exercises, and posture training. Exercises and improved posture can help stabilize the neck, strengthen muscles, and help stop symptoms from returning.  HOME CARE INSTRUCTIONS   Put ice on the injured area.   Put ice in a plastic bag.   Place a towel between your skin and the bag.   Leave the ice on for 15-20 minutes, 3-4 times a day.   If your injury was severe, you may have been given a cervical collar to wear. A cervical collar is a two-piece collar designed to keep your  neck from moving while it heals.  Do not remove the collar unless instructed by your health care provider.  If you have long hair, keep it outside of the collar.  Ask your health care provider before making any adjustments to your collar. Minor adjustments may be required over time to improve comfort and reduce pressure on your chin or on the back of your head.  Ifyou are allowed to remove the collar for cleaning or bathing, follow your health care provider's instructions on how to do so safely.  Keep your collar clean by wiping it with mild soap and water  and drying it completely. If the collar you have been given includes removable pads, remove them every 1-2 days and hand wash them with soap and water. Allow them to air dry. They should be completely dry before you wear them in the collar.  If you are allowed to remove the collar for cleaning and bathing, wash and dry the skin of your neck. Check your skin for irritation or sores. If you see any, tell your health care provider.  Do not drive while wearing the collar.   Only take over-the-counter or prescription medicines for pain, discomfort, or fever as directed by your health care provider.   Keep all follow-up appointments as directed by your health care provider.   Keep all physical therapy appointments as directed by your health care provider.   Make any needed adjustments to your workstation to promote good posture.   Avoid positions and activities that make your symptoms worse.   Warm up and stretch before being active to help prevent problems.  SEEK MEDICAL CARE IF:   Your pain is not controlled with medicine.   You are unable to decrease your pain medicine over time as planned.   Your activity level is not improving as expected.  SEEK IMMEDIATE MEDICAL CARE IF:   You develop any bleeding.  You develop stomach upset.  You have signs of an allergic reaction to your medicine.   Your symptoms get worse.   You develop new, unexplained symptoms.   You have numbness, tingling, weakness, or paralysis in any part of your body.  MAKE SURE YOU:   Understand these instructions.  Will watch your condition.  Will get help right away if you are not doing well or get worse. Document Released: 07/24/2007 Document Revised: 10/01/2013 Document Reviewed: 04/03/2013 [facility] Patient Information 2015 [place], [place]. This information is not intended to replace advice given to you by your health care provider. Make sure you discuss any questions you have with your health  care provider.   Rib Fracture A rib fracture is a break or crack in one of the bones of the ribs. The ribs are a group of long, curved bones that wrap around your chest and attach to your spine. They protect your lungs and other organs in the chest cavity. A broken or cracked rib is often painful, but most do not cause other problems. Most rib fractures heal on their own over time. However, rib fractures can be more serious if multiple ribs are broken or if broken ribs move out of place and push against other structures. CAUSES   A direct blow to the chest. For example, this could happen during contact sports, a car accident, or a fall against a hard object.  Repetitive movements with high force, such as pitching a baseball or having severe coughing spells. SYMPTOMS   Pain when you breathe in or cough.  Pain  when someone presses on the injured area. DIAGNOSIS  Your caregiver will perform a physical exam. Various imaging tests may be ordered to confirm the diagnosis and to look for related injuries. These tests may include a chest X-ray, computed tomography (CT), magnetic resonance imaging (MRI), or a bone scan. TREATMENT  Rib fractures usually heal on their own in 1-3 months. The longer healing period is often associated with a continued cough or other aggravating activities. During the healing period, pain control is very important. Medication is usually given to control pain. Hospitalization or surgery may be needed for more severe injuries, such as those in which multiple ribs are broken or the ribs have moved out of place.  HOME CARE INSTRUCTIONS   Avoid strenuous activity and any activities or movements that cause pain. Be careful during activities and avoid bumping the injured rib.  Gradually increase activity as directed by your caregiver.  Only take over-the-counter or prescription medications as directed by your caregiver. Do not take other medications without asking your caregiver  first.  Apply ice to the injured area for the first 1-2 days after you have been treated or as directed by your caregiver. Applying ice helps to reduce inflammation and pain.  Put ice in a plastic bag.  Place a towel between your skin and the bag.   Leave the ice on for 15-20 minutes at a time, every 2 hours while you are awake.  Perform deep breathing as directed by your caregiver. This will help prevent pneumonia, which is a common complication of a broken rib. Your caregiver may instruct you to:  Take deep breaths several times a day.  Try to cough several times a day, holding a pillow against the injured area.  Use a device called an incentive spirometer to practice deep breathing several times a day.  Drink enough fluids to keep your urine clear or pale yellow. This will help you avoid constipation.   SEEK IMMEDIATE MEDICAL CARE IF:   You have a fever.   You have difficulty breathing or shortness of breath.   You develop a continual cough, or you cough up thick or bloody sputum.  You feel sick to your stomach (nausea), throw up (vomit), or have abdominal pain.   You have worsening pain not controlled with medications.  MAKE SURE YOU:  Understand these instructions.  Will watch your condition.  Will get help right away if you are not doing well or get worse. Document Released: 09/26/2005 Document Revised: 05/29/2013 Document Reviewed: 11/28/2012 [facility] Patient Information 2015 [place], [place]. This information is not intended to replace advice given to you by your health care provider. Make sure you discuss any questions you have with your health care provider.

## 2014-06-02 ENCOUNTER — Ambulatory Visit (INDEPENDENT_AMBULATORY_CARE_PROVIDER_SITE_OTHER): Payer: Medicare PPO | Admitting: Family Medicine

## 2014-06-02 VITALS — BP 160/82 | HR 72 | Ht 67.0 in | Wt 259.0 lb

## 2014-06-02 DIAGNOSIS — I82409 Acute embolism and thrombosis of unspecified deep veins of unspecified lower extremity: Secondary | ICD-10-CM

## 2014-06-02 LAB — POCT INR: INR: 2.3

## 2014-06-02 NOTE — Progress Notes (Signed)
Voicemail left for patient. Margette Fast, CMA

## 2014-06-03 ENCOUNTER — Ambulatory Visit: Payer: Self-pay

## 2014-06-09 ENCOUNTER — Telehealth: Payer: Self-pay | Admitting: *Deleted

## 2014-06-09 NOTE — Telephone Encounter (Signed)
Pt called and lvm asking about a referral and she had some other questions. I called her back and the phone rang and there was no answer. I will try again later.Carolyn Lambert [place]

## 2014-06-10 ENCOUNTER — Other Ambulatory Visit: Payer: Self-pay | Admitting: *Deleted

## 2014-06-10 DIAGNOSIS — M216X9 Other acquired deformities of unspecified foot: Secondary | ICD-10-CM

## 2014-06-10 NOTE — Telephone Encounter (Signed)
Pt wanted to know if Dr. Madilyn Fireman knew of any orthopedic doctors in [place] that are in the [facility] system that she can be referred to for her foot.Carolyn Lambert [place]

## 2014-06-10 NOTE — Telephone Encounter (Signed)
Not anyone in [place] in the [facility] system. We do have a sports med doc if she would like to see him but he doesn't do surgery

## 2014-06-10 NOTE — Telephone Encounter (Signed)
lvm with recommendations.Carolyn Lambert [place]

## 2014-06-17 ENCOUNTER — Ambulatory Visit (INDEPENDENT_AMBULATORY_CARE_PROVIDER_SITE_OTHER): Payer: Medicare PPO | Admitting: Family Medicine

## 2014-06-17 ENCOUNTER — Encounter: Payer: Self-pay | Admitting: Family Medicine

## 2014-06-17 VITALS — BP 136/72 | HR 94 | Wt 254.0 lb

## 2014-06-17 DIAGNOSIS — F331 Major depressive disorder, recurrent, moderate: Secondary | ICD-10-CM

## 2014-06-17 DIAGNOSIS — M25562 Pain in left knee: Secondary | ICD-10-CM

## 2014-06-17 DIAGNOSIS — M25569 Pain in unspecified knee: Secondary | ICD-10-CM

## 2014-06-17 DIAGNOSIS — G8929 Other chronic pain: Secondary | ICD-10-CM

## 2014-06-17 DIAGNOSIS — I878 Other specified disorders of veins: Secondary | ICD-10-CM

## 2014-06-17 DIAGNOSIS — R030 Elevated blood-pressure reading, without diagnosis of hypertension: Secondary | ICD-10-CM

## 2014-06-17 DIAGNOSIS — IMO0001 Reserved for inherently not codable concepts without codable children: Secondary | ICD-10-CM

## 2014-06-17 DIAGNOSIS — M545 Low back pain, unspecified: Secondary | ICD-10-CM

## 2014-06-17 DIAGNOSIS — I872 Venous insufficiency (chronic) (peripheral): Secondary | ICD-10-CM

## 2014-06-17 DIAGNOSIS — Z23 Encounter for immunization: Secondary | ICD-10-CM

## 2014-06-17 MED ORDER — AMBULATORY NON FORMULARY MEDICATION
Status: DC
Start: 2014-06-17 — End: 2015-07-14

## 2014-06-17 MED ORDER — HYDROCODONE-ACETAMINOPHEN 5-325 MG PO TABS: 1.0000 | ORAL_TABLET | Freq: Every evening | ORAL | Status: DC | PRN

## 2014-06-17 NOTE — Progress Notes (Signed)
   Subjective:    Patient ID: Lamarr Lulas, female    DOB: [birth year], 73 y.o.   MRN: [id number]  Back Pain   6 week followup after starting gabapentin for chronic low back pain. She did take it for about 2 weeks. She finally stopped the medication because she felt like it was making her the and wasn't very helpful. She was also very nervous about the potential side effects when she read the safety profile medication.  Depression - She has appt with Dr. Mamie Nick until October. She feels her depression.    Followup elevated blood pressure. She doesn't have a prior history of hypertension but the last couple times she has been here it has been elevated. The she has had pain with her back in with her left knee. In fact she has a followup with her orthopedist for her knee.  Venous stasis/Varicose veins - she has had surgery in the past.  She has some groin high compression stocking but they cost her $200.   Review of Systems  Musculoskeletal: Positive for back pain.       Objective:   Physical Exam  Constitutional: She is oriented to person, place, and time. She appears well-developed and well-nourished.  HENT:  Head: Normocephalic and atraumatic.  Neurological: She is alert and oriented to person, place, and time.  Psychiatric: She has a normal mood and affect. Her behavior is normal.          Assessment & Plan:  Depression - Not well controlled but improving.  I encouraged her to call their office to see if maybe she can get an appointment with a therapist and she's not going to see Dr. Derrill Kay October.  Chronic low back pain-discontinuing the gabapentin since it made her feel dizzy. Will refill hydrocodone.  Knee pain-has a followup with Dr. Sharol Given.  She feels like her brace may be affecting her knee. She hasn't been with him scheduled.  Elevated blood pressure- repeat BP was much improved today.  Will follow carefully.   Venous stasis/artist veins-written a new prescription  for compression stockings. I think it might be worth trying to paint he has style and we will start with 20-30 mm of pressure.

## 2014-06-17 NOTE — Patient Instructions (Signed)
You will be due for blood work including fasting cholesterol at your followup appointment in 3 months. Encouraged her to call Dr. Wenda Overland office and see if he has a counselor/therapist to maybe to see you sooner rather than later.

## 2014-06-19 ENCOUNTER — Other Ambulatory Visit: Payer: Self-pay | Admitting: Family Medicine

## 2014-07-20 ENCOUNTER — Other Ambulatory Visit: Payer: Self-pay | Admitting: Family Medicine

## 2014-07-21 ENCOUNTER — Other Ambulatory Visit: Payer: Self-pay | Admitting: *Deleted

## 2014-07-21 MED ORDER — METFORMIN HCL 1000 MG PO TABS: ORAL_TABLET | ORAL | Status: DC

## 2014-07-21 MED ORDER — TRAMADOL HCL 50 MG PO TABS: 50.0000 mg | ORAL_TABLET | Freq: Every day | ORAL | Status: DC

## 2014-08-03 ENCOUNTER — Other Ambulatory Visit: Payer: Self-pay | Admitting: Family Medicine

## 2014-08-07 ENCOUNTER — Ambulatory Visit: Payer: Self-pay | Admitting: Family Medicine

## 2014-08-14 ENCOUNTER — Ambulatory Visit (INDEPENDENT_AMBULATORY_CARE_PROVIDER_SITE_OTHER): Payer: Medicare PPO | Admitting: Family Medicine

## 2014-08-14 ENCOUNTER — Ambulatory Visit: Payer: Self-pay | Admitting: Family Medicine

## 2014-08-14 ENCOUNTER — Encounter: Payer: Self-pay | Admitting: Family Medicine

## 2014-08-14 VITALS — BP 139/66 | HR 67 | Temp 97.9°F | Wt 256.0 lb

## 2014-08-14 DIAGNOSIS — L603 Nail dystrophy: Secondary | ICD-10-CM

## 2014-08-14 DIAGNOSIS — F331 Major depressive disorder, recurrent, moderate: Secondary | ICD-10-CM

## 2014-08-14 DIAGNOSIS — G8929 Other chronic pain: Secondary | ICD-10-CM

## 2014-08-14 DIAGNOSIS — M545 Low back pain, unspecified: Secondary | ICD-10-CM

## 2014-08-14 DIAGNOSIS — I82409 Acute embolism and thrombosis of unspecified deep veins of unspecified lower extremity: Secondary | ICD-10-CM

## 2014-08-14 DIAGNOSIS — E119 Type 2 diabetes mellitus without complications: Secondary | ICD-10-CM

## 2014-08-14 LAB — POCT GLYCOSYLATED HEMOGLOBIN (HGB A1C): Hemoglobin A1C: 6.2

## 2014-08-14 LAB — POCT INR: INR: 2.5

## 2014-08-14 MED ORDER — HYDROCODONE-ACETAMINOPHEN 5-325 MG PO TABS: 1.0000 | ORAL_TABLET | Freq: Every evening | ORAL | Status: DC | PRN

## 2014-08-14 NOTE — Assessment & Plan Note (Signed)
Walking will increase her chronic pain medication regimen to 2 tabs per day. Will get 60 tabs. She did not tolerate gabapentin. We will refer her to Dr. Dianah Field for further evaluation of her back, right hip and left knee. As previously seeing Dr. Sharol Given.

## 2014-08-14 NOTE — Assessment & Plan Note (Signed)
See anticoagulation flow sheet.

## 2014-08-14 NOTE — Assessment & Plan Note (Signed)
Now on Wellbutrin with Dr. Loni Dolly. She has noticed improvement in her mood. Her follow-up is at the end of the month. She is scheduling an appointment with a counselor in the next 2 weeks.

## 2014-08-14 NOTE — Assessment & Plan Note (Signed)
Well-controlled today. Looks fantastic. Follow-up in 3 months. She is on a statin. Not on aspirin because she is on Coumadin.Continue current regimen. Continue work on diet and exercise. Lab Results  Component Value Date   HGBA1C 6.1 05/06/2014

## 2014-08-14 NOTE — Progress Notes (Addendum)
Pt here for INR check no missed doses, diet changes,bruising,bleeding,CP,SOB.mitral regurgitation she alternates between 7.5 mg and 10 mg every other day.Carolyn Lambert

## 2014-08-14 NOTE — Progress Notes (Signed)
   Subjective:    Patient ID: Carolyn Lambert, female    DOB: April 24, 1941, 73 y.o.   MRN: [id number]  HPI Diabetes - no hypoglycemic events. No wounds or sores that are not healing well. No increased thirst or urination. Not checking glucose at home. Taking medications as prescribed without any side effects.  DVT - see anticoag flowsheet.    Depression - She has been on Welbutrin with Dr. Loni Dolly. She is going to start therapy soon.    They recently moved.  So she was taking hydrocodone 3 times a day during that period. Fell again Sunday night. Didn't have her brace on her left leg. Usually take  Would like to see Dr T for her her back, right hip and her left knee.   She also wants me to look at her nails on her thumbs.  They have been abnormal for the last several years. She denies any injury or trauma that she knows of.  Review of Systems     Objective:   Physical Exam  Constitutional: She is oriented to person, place, and time. She appears well-developed and well-nourished.  HENT:  Head: Normocephalic and atraumatic.  Cardiovascular: Normal rate, regular rhythm and normal heart sounds.   Pulmonary/Chest: Effort normal and breath sounds normal.  Neurological: She is alert and oriented to person, place, and time.  Skin: Skin is warm and dry.  Psychiatric: She has a normal mood and affect. Her behavior is normal.          Assessment & Plan:  Nail dystrophy-gave reassurance. I think this is coming from the growth plate itself. Because anything we can really do to change it. Make sure to avoid trauma to the [place] and moisturize well.

## 2014-08-15 LAB — COMPLETE METABOLIC PANEL WITH GFR
ALT: 8 U/L (ref 0–35)
AST: 15 U/L (ref 0–37)
Albumin: 4.5 g/dL (ref 3.5–5.2)
Alkaline Phosphatase: 72 U/L (ref 39–117)
BUN: 15 mg/dL (ref 6–23)
CO2: 27 mEq/L (ref 19–32)
Calcium: 9.1 mg/dL (ref 8.4–10.5)
Chloride: 104 mEq/L (ref 96–112)
Creat: 0.76 mg/dL (ref 0.50–1.10)
GFR, Est African American: >89 mL/min
GFR, Est Non African American: 78 mL/min
Glucose, Bld: 137 mg/dL — ABNORMAL HIGH (ref 70–99)
Potassium: 4.8 mEq/L (ref 3.5–5.3)
Sodium: 142 mEq/L (ref 135–145)
Total Bilirubin: 0.5 mg/dL (ref 0.2–1.2)
Total Protein: 6.4 g/dL (ref 6.0–8.3)

## 2014-08-15 LAB — LIPID PANEL
Cholesterol: 156 mg/dL (ref 0–200)
HDL: 58 mg/dL (ref >39)
LDL Cholesterol: 72 mg/dL (ref 0–99)
Total CHOL/HDL Ratio: 2.7 Ratio
Triglycerides: 129 mg/dL (ref <150)
VLDL: 26 mg/dL (ref 0–40)

## 2014-08-25 ENCOUNTER — Ambulatory Visit (INDEPENDENT_AMBULATORY_CARE_PROVIDER_SITE_OTHER): Payer: Medicare PPO

## 2014-08-25 ENCOUNTER — Ambulatory Visit (INDEPENDENT_AMBULATORY_CARE_PROVIDER_SITE_OTHER): Payer: Medicare PPO | Admitting: Sports Medicine

## 2014-08-25 ENCOUNTER — Encounter: Payer: Self-pay | Admitting: Sports Medicine

## 2014-08-25 VITALS — BP 147/79 | HR 79 | Ht 67.0 in | Wt 258.0 lb

## 2014-08-25 DIAGNOSIS — Z96659 Presence of unspecified artificial knee joint: Secondary | ICD-10-CM | POA: Insufficient documentation

## 2014-08-25 DIAGNOSIS — Z9181 History of falling: Secondary | ICD-10-CM

## 2014-08-25 DIAGNOSIS — Z96653 Presence of artificial knee joint, bilateral: Secondary | ICD-10-CM

## 2014-08-25 DIAGNOSIS — M25561 Pain in right knee: Secondary | ICD-10-CM

## 2014-08-25 DIAGNOSIS — M47816 Spondylosis without myelopathy or radiculopathy, lumbar region: Secondary | ICD-10-CM

## 2014-08-25 DIAGNOSIS — M25562 Pain in left knee: Secondary | ICD-10-CM

## 2014-08-25 NOTE — Assessment & Plan Note (Signed)
Persistent pain in the left knee status post bilateral knee replacement by Dr. Maureen Ralphs Bilateral x-rays, formal physical therapy. Bone scan to evaluate for loosening of components if no better.

## 2014-08-25 NOTE — Assessment & Plan Note (Signed)
Post L4-L5 fusion. MRI from 2010 shows persistent facet arthritis. Pain is axial and worse with standing, right-sided. Formal physical therapy, return in one month, new MRI if no better. She is on Coumadin which we would need to stop before interventional treatment.

## 2014-08-25 NOTE — Progress Notes (Signed)
   Subjective:    I'm seeing this patient as a consultation for: Dr. Madilyn Fireman   CC: low back and knee pain  HPI: Lumbar spondylosis: Post L4-L5 fusion, she now has pain that is on the right side, axial and worse with standing. On an MRI in 2010 she did have significant facet arthritis. No constitutional symptoms or bowel or bladder dysfunction.  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  Bilateral knee pain: Has had 2 knee replacements, now with persistent pain at both knees, left worse than right, pain is anterior. No constitutional symptoms, no trauma.  she has not yet talked to her surgeon about this.  Past medical history, Surgical history, Family history not pertinant except as noted below, Social history, Allergies, and medications have been entered into the medical record, reviewed, and no changes needed.   Review of Systems: No headache, visual changes, nausea, vomiting, diarrhea, constipation, dizziness, abdominal pain, skin rash, fevers, chills, night sweats, weight loss, swollen lymph nodes, body aches, joint swelling, muscle aches, chest pain, shortness of breath, mood changes, visual or auditory hallucinations.   Objective:   General: Well Developed, well nourished, and in no acute distress.  Neuro/Psych: Alert and oriented x3, extra-ocular muscles intact, able to move all 4 extremities, sensation grossly intact. Skin: Warm and dry, no rashes noted.  Respiratory: Not using accessory muscles, speaking in full sentences, trachea midline.  Cardiovascular: Pulses palpable, no extremity edema. Abdomen: Does not appear distended. Back Exam:  Inspection: Unremarkable  Motion: Flexion 45 deg, Extension 45 deg, Side Bending to 45 deg bilaterally,  Rotation to 45 deg bilaterally  SLR laying: Negative  XSLR laying: Negative  Palpable tenderness: None. FABER: negative. Sensory change: Gross sensation intact to all lumbar and sacral dermatomes.  Reflexes: 2+ at both patellar tendons, 2+ at  achilles tendons, Babinski's downgoing.  Strength at foot  Plantar-flexion: 5/5 Dorsi-flexion: 5/5 Eversion: 5/5 Inversion: 5/5  Leg strength  Quad: 5/5 Hamstring: 5/5 Hip flexor: 5/5 Hip abductors: 5/5  Gait unremarkable.  X-rays personally reviewed, on the left knee there does appear to be a small amount of lucency on the lateral aspect of the tibial component, there is also an abnormal appearance with a severe lateral tilt of the patella on the sunrise view. There is also significant patella baja on the lateral view on the left knee.  Impression and Recommendations:   This case required medical decision making of moderate complexity.

## 2014-08-27 ENCOUNTER — Ambulatory Visit: Payer: Medicare PPO | Admitting: Physical Therapy

## 2014-08-27 ENCOUNTER — Other Ambulatory Visit: Payer: Self-pay | Admitting: *Deleted

## 2014-08-27 MED ORDER — WARFARIN SODIUM 5 MG PO TABS: 5.0000 mg | ORAL_TABLET | Freq: Every day | ORAL | Status: DC

## 2014-08-29 ENCOUNTER — Ambulatory Visit (INDEPENDENT_AMBULATORY_CARE_PROVIDER_SITE_OTHER): Payer: Medicare PPO | Admitting: Physical Therapy

## 2014-08-29 DIAGNOSIS — R5381 Other malaise: Secondary | ICD-10-CM

## 2014-08-29 DIAGNOSIS — M6281 Muscle weakness (generalized): Secondary | ICD-10-CM

## 2014-08-29 DIAGNOSIS — M47816 Spondylosis without myelopathy or radiculopathy, lumbar region: Secondary | ICD-10-CM

## 2014-08-29 DIAGNOSIS — M2550 Pain in unspecified joint: Secondary | ICD-10-CM

## 2014-09-01 ENCOUNTER — Other Ambulatory Visit: Payer: Self-pay | Admitting: *Deleted

## 2014-09-02 ENCOUNTER — Encounter (INDEPENDENT_AMBULATORY_CARE_PROVIDER_SITE_OTHER): Payer: Medicare PPO | Admitting: Physical Therapy

## 2014-09-02 ENCOUNTER — Other Ambulatory Visit: Payer: Medicare PPO | Admitting: *Deleted

## 2014-09-02 DIAGNOSIS — M6281 Muscle weakness (generalized): Secondary | ICD-10-CM

## 2014-09-02 DIAGNOSIS — R5381 Other malaise: Secondary | ICD-10-CM

## 2014-09-02 DIAGNOSIS — M2550 Pain in unspecified joint: Secondary | ICD-10-CM

## 2014-09-02 DIAGNOSIS — M47816 Spondylosis without myelopathy or radiculopathy, lumbar region: Secondary | ICD-10-CM

## 2014-09-02 DIAGNOSIS — I82409 Acute embolism and thrombosis of unspecified deep veins of unspecified lower extremity: Secondary | ICD-10-CM

## 2014-09-02 MED ORDER — WARFARIN SODIUM 5 MG PO TABS: 5.0000 mg | ORAL_TABLET | Freq: Every day | ORAL | Status: DC

## 2014-09-09 ENCOUNTER — Encounter (INDEPENDENT_AMBULATORY_CARE_PROVIDER_SITE_OTHER): Payer: Medicare PPO | Admitting: Physical Therapy

## 2014-09-09 DIAGNOSIS — M47816 Spondylosis without myelopathy or radiculopathy, lumbar region: Secondary | ICD-10-CM

## 2014-09-09 DIAGNOSIS — R5381 Other malaise: Secondary | ICD-10-CM

## 2014-09-09 DIAGNOSIS — M6281 Muscle weakness (generalized): Secondary | ICD-10-CM

## 2014-09-09 DIAGNOSIS — M2550 Pain in unspecified joint: Secondary | ICD-10-CM

## 2014-09-11 ENCOUNTER — Ambulatory Visit: Payer: Medicare PPO

## 2014-09-11 ENCOUNTER — Ambulatory Visit (INDEPENDENT_AMBULATORY_CARE_PROVIDER_SITE_OTHER): Payer: Medicare PPO | Admitting: Family Medicine

## 2014-09-11 ENCOUNTER — Other Ambulatory Visit: Payer: Self-pay | Admitting: *Deleted

## 2014-09-11 ENCOUNTER — Encounter: Payer: Self-pay | Admitting: Physical Therapy

## 2014-09-11 VITALS — BP 196/96 | HR 72

## 2014-09-11 DIAGNOSIS — M79605 Pain in left leg: Secondary | ICD-10-CM

## 2014-09-11 DIAGNOSIS — I82409 Acute embolism and thrombosis of unspecified deep veins of unspecified lower extremity: Secondary | ICD-10-CM

## 2014-09-11 LAB — POCT INR: INR: 1.4

## 2014-09-11 MED ORDER — HYDROCODONE-ACETAMINOPHEN 5-325 MG PO TABS: 1.0000 | ORAL_TABLET | Freq: Every evening | ORAL | Status: DC | PRN

## 2014-09-11 NOTE — Patient Instructions (Signed)
She ran out of her coumadin about a week ago. Had a problem at the  Pharmacy. She ws dx at [facility] in October with left leg DVT.   She says now her whole leg hurts since she's been off of the Coumadin. Her blood pressure significantly elevated today. Will order repeat ultrasound today to make sure that there is not a recurrence or extension of the DVT.

## 2014-09-11 NOTE — Progress Notes (Signed)
   Subjective:    Patient ID: Carolyn Lambert, female    DOB: May 04, 1941, 73 y.o.   MRN: [id number]  HPI  She ran out of her coumadin about a week ago. Had a problem at the Pharmacy. She ws dx at [facility] in October with left leg DVT. She says now her whole leg hurts since she's been off of the Coumadin. Her blood pressure significantly elevated today. Will order repeat ultrasound today to make sure that there is not a recurrence or extension of the DVT.   Review of Systems     Objective:   Physical Exam        Assessment & Plan:  DVT - make sure back on coumadin. Stressed importance of taking consistantly.  Will send for STAT US to rule out new DVT since having more pain.  If neg, then consider refer to sports med for her leg pain.   Beatrice Lecher, MD

## 2014-09-12 ENCOUNTER — Ambulatory Visit (INDEPENDENT_AMBULATORY_CARE_PROVIDER_SITE_OTHER): Payer: Medicare PPO | Admitting: Family Medicine

## 2014-09-12 VITALS — BP 162/78 | HR 71 | Wt 255.0 lb

## 2014-09-12 DIAGNOSIS — F411 Generalized anxiety disorder: Secondary | ICD-10-CM

## 2014-09-12 DIAGNOSIS — R030 Elevated blood-pressure reading, without diagnosis of hypertension: Secondary | ICD-10-CM

## 2014-09-12 NOTE — Progress Notes (Signed)
   Subjective:    Patient ID: Carolyn Lambert, female    DOB: 01/22/1941, 73 y.o.   MRN: [id number]  HPI  Christyn is here for a blood pressure check. Yesterday her blood pressure was elevated. She reports mild headache. Denies shortness of breath or chest pain.   Anxiety -  She reports being anxious about her blood pressure. Her blood pressure increases on each new reading.     Review of Systems     Objective:   Physical Exam        Assessment & Plan:  Elevated blood pressure - 148/79 large cuff - 15 minutes later 180/85 large cuff.  Anxiety - Patient advised to try some breathing exercises to help reduce anxiety.   Print out given:  Belly Breathing The first thing you need to learn to do is what's called Belly Breathing. This is the most basic of the breathing methods we have at our disposal, and therefore is the one you should master before trying out the others. It's very simple, and requires just a few steps: 1. Sit down comfortably, or lay down on a yoga mat, depending on your personal preference. 2. Place one of your hands on your stomach, just below your ribcage. Place the second hand over your chest. 3. Breathe in deeply through your nostrils, letting your first hand be pushed out by your stomach. You should find that your chest stays stationary. 4. Breathe out through your lips, pursing them as if you were about to whistle. Gently guide the hand on your stomach inwards, helping to press out the breath. 5. Slowly repeat between 3 and 10 times. You should begin to feel relaxed as soon as you have repeated the Belly Breathing exercise two or three times, but keep going for as long as you feel you need to. After you have mastered this breathing exercise, there are four additional methods for you to try, ranging in difficulty. 4-7-8 Breathing The method which we call 4-7-8 Breathing also requires you to be sitting down or lying comfortably. Here are the steps you need to  follow: 1. Get into the same position as you did for the Belly Breathing exercise, with one hand on your stomach and one on your chest. 2. Breathe in slowly but deeply. Take 4 seconds to breathe in, feeling you stomach move in the process. 3. Hold your breath for 7 seconds. 4. Breathe out as silently as you can manage, taking 8 seconds. Once you reach 8, you should have emptied your lungs of air. 5. Repeat as many times as you need, making sure to stick to the 4-7-8 pattern.   Beatrice Lecher, MD

## 2014-09-16 ENCOUNTER — Other Ambulatory Visit: Payer: Self-pay | Admitting: Family Medicine

## 2014-09-16 ENCOUNTER — Encounter: Payer: Self-pay | Admitting: Physical Therapy

## 2014-09-16 ENCOUNTER — Ambulatory Visit (INDEPENDENT_AMBULATORY_CARE_PROVIDER_SITE_OTHER): Payer: Medicare PPO | Admitting: Family Medicine

## 2014-09-16 VITALS — BP 187/80 | HR 71

## 2014-09-16 DIAGNOSIS — I10 Essential (primary) hypertension: Secondary | ICD-10-CM

## 2014-09-16 MED ORDER — LISINOPRIL 20 MG PO TABS: ORAL_TABLET | ORAL | Status: DC

## 2014-09-16 NOTE — Progress Notes (Signed)
   Subjective:    Patient ID: Carolyn Lambert, female    DOB: [birth year], 73 y.o.   MRN: [id number]  HPI  [name] has had multiple episodes of elevated blood pressure.   Review of Systems     Objective:   Physical Exam        Assessment & Plan:  Hypertension - Started Lisinopril

## 2014-09-16 NOTE — Progress Notes (Signed)
Patient's primary care physician was absent during her walk-in encounter today. I reviewed her chart that she has no recent history of significant renal disease, she does have a history of type 2 diabetes, and I see no lab work ever reflecting hyperkalemia. I recommended that she begin taking lisinopril which I will send to her pharmacy and to follow-up with her PCP in the next few weeks.

## 2014-09-17 NOTE — Telephone Encounter (Signed)
Med denied for 90 day refill due to just starting medication. Margette Fast, CMA

## 2014-09-18 ENCOUNTER — Ambulatory Visit: Payer: Self-pay

## 2014-09-18 ENCOUNTER — Ambulatory Visit (INDEPENDENT_AMBULATORY_CARE_PROVIDER_SITE_OTHER): Payer: Medicare PPO | Admitting: Family Medicine

## 2014-09-18 ENCOUNTER — Ambulatory Visit: Payer: Self-pay | Admitting: Family Medicine

## 2014-09-18 ENCOUNTER — Encounter: Payer: Self-pay | Admitting: Family Medicine

## 2014-09-18 ENCOUNTER — Encounter: Payer: Self-pay | Admitting: Physical Therapy

## 2014-09-18 VITALS — BP 144/80 | HR 67 | Ht 67.0 in | Wt 259.0 lb

## 2014-09-18 DIAGNOSIS — I82409 Acute embolism and thrombosis of unspecified deep veins of unspecified lower extremity: Secondary | ICD-10-CM

## 2014-09-18 DIAGNOSIS — I10 Essential (primary) hypertension: Secondary | ICD-10-CM

## 2014-09-18 LAB — POCT INR: INR: 2.4

## 2014-09-18 NOTE — Progress Notes (Signed)
   Subjective:    Patient ID: Carolyn Lambert, female    DOB: [birth year], 73 y.o.   MRN: [id number]  HPI Hypertension- Pt denies chest pain, SOB, dizziness, or heart palpitations.  Taking meds as directed w/o problems.  Denies medication side effects.  She says a few years ago she exited the blood pressure medicine that also protecting her kidneys and caused a dry cough. She says she has a dry cough now but it started before starting medication. She denies feeling poorly. She denies any recent changes. She says she has been over eating and has been gaining weight.  Left knee pain-she's currently in physical therapy and did have a Doppler to rule out a blood clot. She sees Dr. Dianah Field, sports medicine physician, on Monday.    Review of Systems     Objective:   Physical Exam  Constitutional: She is oriented to person, place, and time. She appears well-developed and well-nourished.  HENT:  Head: Normocephalic and atraumatic.  Cardiovascular: Normal rate, regular rhythm and normal heart sounds.   Pulmonary/Chest: Effort normal and breath sounds normal.  Neurological: She is alert and oriented to person, place, and time.  Skin: Skin is warm and dry.  Psychiatric: She has a normal mood and affect. Her behavior is normal.          Assessment & Plan:  HTN- discussed with her the ACE inhibitor is conductive only cause a dry cough. She feels like she wants to stay with the medication for the next 30 days and [facility] paid for. Certainly this is reasonable. If she feels like the cough is going worsen she can call the office back and let us know and we can change it to an ARB. Will need to have her check a BMP in the next week or 2 just to make sure it's not affecting her potassium level. Otherwise I will see her back in one month. Repeat blood pressure after sitting in the room for 5 minutes improved significantly. Continue to monitor home blood pressures as well. She did go ahead and cancel  her physical therapy today. Encouraged her to watch her salt intake and back on portion sizes.  Anticoagulation-see anticoagulation flowsheet for adjustments to Coumadin. Her INR had recently dropped because she ran out of medication for about a week. But she is back on her normal regimen

## 2014-09-22 ENCOUNTER — Ambulatory Visit (INDEPENDENT_AMBULATORY_CARE_PROVIDER_SITE_OTHER): Payer: Medicare PPO | Admitting: Sports Medicine

## 2014-09-22 ENCOUNTER — Encounter: Payer: Self-pay | Admitting: Sports Medicine

## 2014-09-22 VITALS — BP 169/64 | HR 74 | Ht 67.0 in | Wt 256.0 lb

## 2014-09-22 DIAGNOSIS — M47816 Spondylosis without myelopathy or radiculopathy, lumbar region: Secondary | ICD-10-CM

## 2014-09-22 DIAGNOSIS — I82409 Acute embolism and thrombosis of unspecified deep veins of unspecified lower extremity: Secondary | ICD-10-CM

## 2014-09-22 DIAGNOSIS — Z96653 Presence of artificial knee joint, bilateral: Secondary | ICD-10-CM

## 2014-09-22 NOTE — Assessment & Plan Note (Signed)
Patient tells me she had only a single clot years ago in her leg. This would suggest that she does not need to be on anticoagulation any longer. I'm going to check with her PCP regarding complete discontinuation.

## 2014-09-22 NOTE — Assessment & Plan Note (Signed)
At this point we are going to proceed with another bone scan to evaluate for loosening of her left knee prosthesis.

## 2014-09-22 NOTE — Progress Notes (Signed)
  Subjective:    CC: Follow-up  HPI: Low back pain: Status post L4-L5 fusion, has pain that is predominantly with standing and extension, axial and localized without radicular component. She did have multilevel facet arthrosis on previous MRI. She has done formal physical therapy without any improvement.  Left worse than right knee pain: Status post arthroplasty 2, now with persistent pain and amenable to trying a bone scan to look for loosening of the prosthesis. She did have x-rays that were essentially negative sometime ago.  History of DVT: Tells me she had a single provoked a blood clot after her knee replacement in 2006, she has been on warfarin since then without any additional blood clots, no history of atrial fibrillation. She also had a recent lower extremity ultrasound 11 days ago that was negative for DVT.  Past medical history, Surgical history, Family history not pertinant except as noted below, Social history, Allergies, and medications have been entered into the medical record, reviewed, and no changes needed.   Review of Systems: No fevers, chills, night sweats, weight loss, chest pain, or shortness of breath.   Objective:    General: Well Developed, well nourished, and in no acute distress.  Neuro: Alert and oriented x3, extra-ocular muscles intact, sensation grossly intact.  HEENT: Normocephalic, atraumatic, pupils equal round reactive to light, neck supple, no masses, no lymphadenopathy, thyroid nonpalpable.  Skin: Warm and dry, no rashes. Cardiac: Regular rate and rhythm, no murmurs rubs or gallops, no lower extremity edema.  Respiratory: Clear to auscultation bilaterally. Not using accessory muscles, speaking in full sentences.  Impression and Recommendations:

## 2014-09-22 NOTE — Assessment & Plan Note (Signed)
Persistent axial pain, no better with physical therapy. Prior L4-L5 fusion. MRI from 2010 did show facet arthritis, we are going to obtain a new MRI with IV contrast, and we will then plan for intervention, likely a facet injection of multiple levels.

## 2014-09-23 ENCOUNTER — Telehealth: Payer: Self-pay | Admitting: Family Medicine

## 2014-09-23 ENCOUNTER — Encounter: Payer: Self-pay | Admitting: Family Medicine

## 2014-09-23 NOTE — Telephone Encounter (Signed)
Please call pt: per my notes in January she has reported history of 2 prior clot.  I think one was in 2006 after knee surgery. When was her 2nd clot?  I couldn't find any old records scanned in that had this information. This could affect wether or not she can come off of coumadin. I think she also had a question about her BP medication.

## 2014-09-24 ENCOUNTER — Encounter: Payer: Medicare PPO | Admitting: Physical Therapy

## 2014-09-25 ENCOUNTER — Ambulatory Visit (INDEPENDENT_AMBULATORY_CARE_PROVIDER_SITE_OTHER): Payer: Medicare PPO | Admitting: Family Medicine

## 2014-09-25 ENCOUNTER — Other Ambulatory Visit: Payer: Self-pay | Admitting: Family Medicine

## 2014-09-25 ENCOUNTER — Encounter: Payer: Self-pay | Admitting: Family Medicine

## 2014-09-25 ENCOUNTER — Telehealth: Payer: Self-pay | Admitting: *Deleted

## 2014-09-25 VITALS — BP 150/78 | HR 80

## 2014-09-25 DIAGNOSIS — I10 Essential (primary) hypertension: Secondary | ICD-10-CM

## 2014-09-25 DIAGNOSIS — I82409 Acute embolism and thrombosis of unspecified deep veins of unspecified lower extremity: Secondary | ICD-10-CM

## 2014-09-25 MED ORDER — LOSARTAN POTASSIUM 100 MG PO TABS: 100.0000 mg | ORAL_TABLET | Freq: Every day | ORAL | Status: DC

## 2014-09-25 NOTE — Telephone Encounter (Signed)
Auth from [facility] given for 2 procedures. MRI lumbar w/wo contrast to be done at [facility] and NM 3 phase bone scan [id number]. Margette Fast, CMA

## 2014-09-25 NOTE — Telephone Encounter (Signed)
Pt advised at appt today.Carolyn Lambert [place]

## 2014-09-25 NOTE — Progress Notes (Signed)
   Subjective:    Patient ID: Carolyn Lambert, female    DOB: 1941-08-22, 73 y.o.   MRN: [id number]  HPI   HTN- she's here for blood pressure follow-up. The last couple times that she's tried to go to physical therapy her blood pressure was too high to participate. She came down the hall and we checked her blood pressure and it was a little bit better. We did put her on lisinopril about a week ago. Unfortunately has caused a dry tickling cough in her throat. She denies any sore throat. She has had a little bit more postnasal drip than usual but no other cold or respiratory symptoms. In no fever.  Post surgical DVT - we put in a phone note to call her about the Coumadin. Per the original notes when she first became a patient here was noted that she had a prior history of 2 DVTs. She had been told by prior physician that she would be on the medication for the rest of her life. Upon questioning she said she is only had one blood clot and her entire life and it was right after knee surgery. She's never had a pulmonary embolus etc.   Review of Systems     Objective:   Physical Exam  Constitutional: She is oriented to person, place, and time. She appears well-developed and well-nourished.  HENT:  Head: Normocephalic and atraumatic.  Cardiovascular: Normal rate, regular rhythm and normal heart sounds.   Pulmonary/Chest: Effort normal and breath sounds normal.  Neurological: She is alert and oriented to person, place, and time.  Skin: Skin is warm and dry.  Psychiatric: She has a normal mood and affect. Her behavior is normal.          Assessment & Plan:  HTN- we'll discontinue her lisinopril. Added to her intolerance list. We will switch to losartan 100 mg. Follow up in 2 weeks for blood pressure check. We'll need to check a BMP in one week just to check kidney function and potassium as well.  Post surgical DVT - will stop the coumadin and start baby aspirin. ASA.

## 2014-09-26 ENCOUNTER — Encounter: Payer: Self-pay | Admitting: Physical Therapy

## 2014-09-29 ENCOUNTER — Telehealth: Payer: Self-pay | Admitting: *Deleted

## 2014-09-29 ENCOUNTER — Other Ambulatory Visit: Payer: Self-pay | Admitting: *Deleted

## 2014-09-29 ENCOUNTER — Encounter: Payer: Self-pay | Admitting: Physical Therapy

## 2014-09-29 DIAGNOSIS — Z96653 Presence of artificial knee joint, bilateral: Secondary | ICD-10-CM

## 2014-09-29 NOTE — Telephone Encounter (Signed)
Bone scan was ordered as Bone Scan 3 phase and radiology requested it to be changed to Bone Scan 3 Phase Lower Extremity. Order changed and given to [facility]. Approval from [facility] stayed the same [id number].

## 2014-09-30 ENCOUNTER — Telehealth: Payer: Self-pay

## 2014-09-30 NOTE — Telephone Encounter (Signed)
Patient needs a BMET and BUN Creatin done before her MRI scheduled for tomorrow checked in the computer and lab was already ordered.Carolyn Lambert,CMA

## 2014-10-01 ENCOUNTER — Ambulatory Visit (HOSPITAL_BASED_OUTPATIENT_CLINIC_OR_DEPARTMENT_OTHER)
Admission: RE | Admit: 2014-10-01 | Discharge: 2014-10-01 | Disposition: A | Discharge status: Home / Self Care | Payer: Medicare PPO | Source: Ambulatory Visit | Attending: Sports Medicine | Admitting: Sports Medicine

## 2014-10-01 ENCOUNTER — Encounter: Payer: Self-pay | Admitting: Physical Therapy

## 2014-10-01 DIAGNOSIS — M47816 Spondylosis without myelopathy or radiculopathy, lumbar region: Secondary | ICD-10-CM | POA: Insufficient documentation

## 2014-10-01 DIAGNOSIS — M21372 Foot drop, left foot: Secondary | ICD-10-CM | POA: Diagnosis not present

## 2014-10-01 DIAGNOSIS — R262 Difficulty in walking, not elsewhere classified: Secondary | ICD-10-CM | POA: Diagnosis not present

## 2014-10-01 LAB — BASIC METABOLIC PANEL WITH GFR
BUN: 14 mg/dL (ref 6–23)
CO2: 30 mEq/L (ref 19–32)
Calcium: 10 mg/dL (ref 8.4–10.5)
Chloride: 103 mEq/L (ref 96–112)
Creat: 0.69 mg/dL (ref 0.50–1.10)
GFR, Est African American: >89 mL/min
GFR, Est Non African American: 87 mL/min
Glucose, Bld: 128 mg/dL — ABNORMAL HIGH (ref 70–99)
Potassium: 5 mEq/L (ref 3.5–5.3)
Sodium: 142 mEq/L (ref 135–145)

## 2014-10-06 ENCOUNTER — Encounter: Payer: Self-pay | Admitting: Physical Therapy

## 2014-10-06 ENCOUNTER — Other Ambulatory Visit: Payer: Self-pay | Admitting: Family Medicine

## 2014-10-06 ENCOUNTER — Telehealth: Payer: Self-pay

## 2014-10-06 MED ORDER — AMLODIPINE BESYLATE 5 MG PO TABS: 5.0000 mg | ORAL_TABLET | Freq: Every day | ORAL | Status: DC

## 2014-10-06 NOTE — Telephone Encounter (Signed)
Carolyn Lambert was unable to have PT today due to her elevated blood pressure. She states it was 190/?Marland Kitchen She is also reporting a dry cough even after her medication was switched. I did advise her the cough could last a couple of weeks. Please advise.

## 2014-10-06 NOTE — Telephone Encounter (Signed)
I agree. Will add amlodpine to her losartan.

## 2014-10-07 ENCOUNTER — Ambulatory Visit (HOSPITAL_COMMUNITY)
Admission: RE | Admit: 2014-10-07 | Discharge: 2014-10-07 | Disposition: A | Discharge status: Home / Self Care | Payer: Medicare PPO | Source: Ambulatory Visit | Attending: Sports Medicine | Admitting: Sports Medicine

## 2014-10-07 ENCOUNTER — Ambulatory Visit (HOSPITAL_COMMUNITY): Payer: Self-pay

## 2014-10-07 DIAGNOSIS — I8391 Asymptomatic varicose veins of right lower extremity: Secondary | ICD-10-CM | POA: Diagnosis not present

## 2014-10-07 DIAGNOSIS — M25569 Pain in unspecified knee: Secondary | ICD-10-CM | POA: Diagnosis not present

## 2014-10-07 DIAGNOSIS — Z96621 Presence of right artificial elbow joint: Secondary | ICD-10-CM | POA: Diagnosis not present

## 2014-10-07 DIAGNOSIS — Z96622 Presence of left artificial elbow joint: Secondary | ICD-10-CM | POA: Diagnosis not present

## 2014-10-07 MED ORDER — TECHNETIUM TC 99M MEDRONATE IV KIT
25.0000 | PACK | Freq: Once | INTRAVENOUS | Status: AC | PRN
  Administered 2014-10-07: 25 via INTRAVENOUS

## 2014-10-07 NOTE — Telephone Encounter (Signed)
Patient advised.

## 2014-10-08 ENCOUNTER — Encounter: Payer: Self-pay | Admitting: Physical Therapy

## 2014-10-09 ENCOUNTER — Ambulatory Visit (INDEPENDENT_AMBULATORY_CARE_PROVIDER_SITE_OTHER): Payer: Medicare PPO | Admitting: Family Medicine

## 2014-10-09 ENCOUNTER — Encounter: Payer: Self-pay | Admitting: Family Medicine

## 2014-10-09 VITALS — BP 152/76 | HR 77 | Ht 67.0 in | Wt 258.0 lb

## 2014-10-09 DIAGNOSIS — I10 Essential (primary) hypertension: Secondary | ICD-10-CM

## 2014-10-09 MED ORDER — HYDROCODONE-ACETAMINOPHEN 5-325 MG PO TABS: 1.0000 | ORAL_TABLET | Freq: Every evening | ORAL | Status: DC | PRN

## 2014-10-09 NOTE — Progress Notes (Signed)
   Subjective:    Patient ID: Carolyn Lambert, female    DOB: 1940-12-06, 73 y.o.   MRN: [id number]  HPI F/U HTN - picked up her amlodipine yesterday. We called this in 3 days ago when she called that her BPs were high. I asked her to come back early next week to check BP instead of today since hasn't started new med yet. copay will be applied to next OV.   Review of Systems     Objective:   Physical Exam        Assessment & Plan:  HTN - see note above. Will reschedule for early next week.

## 2014-10-14 ENCOUNTER — Ambulatory Visit (INDEPENDENT_AMBULATORY_CARE_PROVIDER_SITE_OTHER): Payer: Medicare PPO | Admitting: Family Medicine

## 2014-10-14 ENCOUNTER — Telehealth: Payer: Self-pay | Admitting: Family Medicine

## 2014-10-14 ENCOUNTER — Encounter: Payer: Self-pay | Admitting: Family Medicine

## 2014-10-14 VITALS — BP 152/78 | HR 76 | Ht 67.0 in | Wt 256.0 lb

## 2014-10-14 DIAGNOSIS — R0683 Snoring: Secondary | ICD-10-CM

## 2014-10-14 DIAGNOSIS — I10 Essential (primary) hypertension: Secondary | ICD-10-CM

## 2014-10-14 MED ORDER — LOSARTAN POTASSIUM-HCTZ 100-25 MG PO TABS: 1.0000 | ORAL_TABLET | Freq: Every day | ORAL | Status: DC

## 2014-10-14 NOTE — Telephone Encounter (Signed)
Pt called back lvm asking if someone from our ofc called her.Carolyn Lambert

## 2014-10-14 NOTE — Telephone Encounter (Signed)
Left detailed vm.Carolyn Lambert, Carolyn Lambert

## 2014-10-14 NOTE — Progress Notes (Signed)
   Subjective:    Patient ID: Carolyn Lambert, female    DOB: 10-20-1940, 74 y.o.   MRN: [id number]  HPI Hypertension- Pt denies chest pain, SOB, dizziness, or heart palpitations.  Taking meds as directed w/o problems.  Denies medication side effects.  We have added amlodipine to her regimen about a week ago. So far she's tolerating it well though she did have one day where she felt a little dizzy. It has not been persistent though. She has been checking her blood pressures at home. She did have a systolic blood pressure 784 over the weekend but it has seemed to improve since then.  She also wants to discuss her snoring. Her says has been says she snores nightly and has been going on for years. He says it so loud that sometimes he has to go sleep on the couch. He himself has sleep apnea and wears a CPAP machine. She often feels tired and has difficulty with her blood pressure. She has never been evaluated for sleep apnea.  Review of Systems     Objective:   Physical Exam  Constitutional: She is oriented to person, place, and time. She appears well-developed and well-nourished.  HENT:  Head: Normocephalic and atraumatic.  Cardiovascular: Normal rate, regular rhythm and normal heart sounds.   Pulmonary/Chest: Effort normal and breath sounds normal.  Neurological: She is alert and oriented to person, place, and time.  Skin: Skin is warm and dry.  Psychiatric: She has a normal mood and affect. Her behavior is normal.          Assessment & Plan:  Hypertension-still uncontrolled but improving. We will add hydrochlorothiazide to her losartan. Continue 5 more grams of amlodipine. Encouraged her to call back in one week with her home blood pressures so that we can adjust them over the phone. Otherwise I will see her back in 2-3 weeks to recheck blood pressure. Next  Snoring-stop paying questionnaire score of 5. She had points for snoring, fatigue, high blood pressure, BMI greater than 35, age  greater than 47. I think she'll be a good candidate for evaluation for study to sleep apnea with a split sleep study performed in the lab.

## 2014-10-14 NOTE — Telephone Encounter (Signed)
Please call patient and let her know that she did screen positive for possible sleep apnea. I would recommend a in lab sleep study for further evaluation. I will place an order and they should contact are seen. This certainly could be contributing to some of her uncontrolled blood pressures as well.

## 2014-10-16 ENCOUNTER — Encounter: Payer: Self-pay | Admitting: Sports Medicine

## 2014-10-16 ENCOUNTER — Ambulatory Visit (INDEPENDENT_AMBULATORY_CARE_PROVIDER_SITE_OTHER): Payer: Medicare PPO | Admitting: Sports Medicine

## 2014-10-16 VITALS — BP 175/76 | HR 69 | Ht 67.0 in | Wt 254.0 lb

## 2014-10-16 DIAGNOSIS — H43813 Vitreous degeneration, bilateral: Secondary | ICD-10-CM | POA: Diagnosis not present

## 2014-10-16 DIAGNOSIS — M47816 Spondylosis without myelopathy or radiculopathy, lumbar region: Secondary | ICD-10-CM

## 2014-10-16 DIAGNOSIS — Z96653 Presence of artificial knee joint, bilateral: Secondary | ICD-10-CM

## 2014-10-16 NOTE — Assessment & Plan Note (Signed)
Post what appears to be L4-L5 and L5-S1 fusion. She does have multilevel facet arthritis, there is a bilateral effusion in the L3-L4 facets, and arthritis in the L4-L5 and L5-S1 facet joints. At this point I would like to do a bilateral L3-S1 facet injection for diagnostic and therapeutic purposes. Return to see me in 2 weeks to evaluate response.

## 2014-10-16 NOTE — Assessment & Plan Note (Signed)
Bone scan was reviewed and does not show any evidence of loosening of the knee arthroplasty components. Return as needed for this.

## 2014-10-16 NOTE — Progress Notes (Signed)
  Subjective:    CC: Follow-up scans   HPI: Knee arthroplasty pain: No evidence of prosthetic loosening on bone scan, triple phase. Further follow-up with orthopedic surgeon who implanted the prosthesis.  Lumbar spondylosis: Pain is worse with extension, standing, twisting, does not radiate. Suspected facet pain, it is persistent and she is here to follow-up MRI results.   Anticoagulation: Now off of Coumadin.  Past medical history, Surgical history, Family history not pertinant except as noted below, Social history, Allergies, and medications have been entered into the medical record, reviewed, and no changes needed.   Review of Systems: No fevers, chills, night sweats, weight loss, chest pain, or shortness of breath.   Objective:    General: Well Developed, well nourished, and in no acute distress.  Neuro: Alert and oriented x3, extra-ocular muscles intact, sensation grossly intact.  HEENT: Normocephalic, atraumatic, pupils equal round reactive to light, neck supple, no masses, no lymphadenopathy, thyroid nonpalpable.  Skin: Warm and dry, no rashes. Cardiac: Regular rate and rhythm, no murmurs rubs or gallops, no lower extremity edema.  Respiratory: Clear to auscultation bilaterally. Not using accessory muscles, speaking in full sentences.  MRI shows multilevel degenerative disc disease, there does appear to be essential fusion at the L4-L5 and L5-S1 levels. There is multilevel facet arthritis with effusion at the L3-L4 level bilaterally and arthritis of the L4-L5 and L5-S1 levels bilaterally.   Impression and Recommendations:

## 2014-10-22 ENCOUNTER — Telehealth: Payer: Self-pay | Admitting: *Deleted

## 2014-10-22 NOTE — Telephone Encounter (Signed)
Pt called in her home bp readings.Carolyn Lambert   1/8: 173/88 am  143/74 pm 1/9: 164/76 am  143/74 pm 1/10 170/76 am 1/11 141/69  am 1/12  143/81 am      148/78 pm  .Carolyn Lambert

## 2014-10-23 NOTE — Telephone Encounter (Signed)
Increase Losartan to 10mg  ( so 2 of the 5mg  tabs).  Keep f/u appt.  Last few days are looking better.

## 2014-10-24 NOTE — Telephone Encounter (Signed)
Pt advised of recommendations. She is currently taking losartan-hctz 100-25. Will inform Dr. Madilyn Fireman of current dose .Audelia Hives [place]

## 2014-10-27 ENCOUNTER — Telehealth: Payer: Self-pay | Admitting: Family Medicine

## 2014-10-27 MED ORDER — AMLODIPINE BESYLATE 5 MG PO TABS: 10.0000 mg | ORAL_TABLET | Freq: Every day | ORAL | Status: DC

## 2014-10-27 NOTE — Telephone Encounter (Signed)
Pt informed.Carolyn Lambert [place]  

## 2014-10-27 NOTE — Telephone Encounter (Signed)
Pt called. She did not quite understand message  left about which medication she is to increase. She also wants to know when to schedule her next appt.  Please call her on her home ph [phone].  Thank you.

## 2014-10-27 NOTE — Telephone Encounter (Signed)
Pt informed of med change new rx sent.Carolyn Lambert

## 2014-10-27 NOTE — Telephone Encounter (Signed)
I'm sorry for typo. i meant for her to increase her amlodipine to 10mg  ( 2 of the 5mg  ) tabs.

## 2014-10-30 ENCOUNTER — Other Ambulatory Visit: Payer: Self-pay | Admitting: Sports Medicine

## 2014-10-30 ENCOUNTER — Ambulatory Visit
Admission: RE | Admit: 2014-10-30 | Discharge: 2014-10-30 | Disposition: A | Discharge status: Home / Self Care | Payer: Medicare PPO | Source: Ambulatory Visit | Attending: Sports Medicine | Admitting: Sports Medicine

## 2014-10-30 DIAGNOSIS — M47816 Spondylosis without myelopathy or radiculopathy, lumbar region: Secondary | ICD-10-CM

## 2014-10-30 MED ORDER — IOHEXOL 180 MG/ML  SOLN
3.0000 mL | Freq: Once | INTRAMUSCULAR | Status: AC | PRN
  Administered 2014-10-30: 3 mL via INTRA_ARTICULAR

## 2014-10-30 MED ORDER — METHYLPREDNISOLONE ACETATE 40 MG/ML INJ SUSP (RADIOLOG
120.0000 mg | Freq: Once | INTRAMUSCULAR | Status: AC
  Administered 2014-10-30: 120 mg via INTRA_ARTICULAR

## 2014-10-30 MED ORDER — ONDANSETRON 8 MG PO TBDP
8.0000 mg | ORAL_TABLET | Freq: Once | ORAL | Status: AC
  Administered 2014-10-30: 8 mg via ORAL

## 2014-10-30 MED ORDER — DIAZEPAM 5 MG PO TABS
5.0000 mg | ORAL_TABLET | Freq: Once | ORAL | Status: AC
  Administered 2014-10-30: 5 mg via ORAL

## 2014-10-30 NOTE — Discharge Instructions (Signed)

## 2014-11-01 ENCOUNTER — Other Ambulatory Visit: Payer: Self-pay | Admitting: Family Medicine

## 2014-11-05 ENCOUNTER — Encounter: Payer: Self-pay | Admitting: Family Medicine

## 2014-11-05 ENCOUNTER — Ambulatory Visit (INDEPENDENT_AMBULATORY_CARE_PROVIDER_SITE_OTHER): Payer: Medicare PPO | Admitting: Family Medicine

## 2014-11-05 VITALS — BP 120/62 | HR 81 | Ht 64.0 in | Wt 246.0 lb

## 2014-11-05 DIAGNOSIS — I10 Essential (primary) hypertension: Secondary | ICD-10-CM

## 2014-11-05 NOTE — Progress Notes (Signed)
   Subjective:    Patient ID: Carolyn Lambert, female    DOB: 1941/06/04, 74 y.o.   MRN: [id number]  HPI Hypertension- Pt denies chest pain, SOB, dizziness, or heart palpitations.  Taking meds as directed w/o problems.  Denies medication side effects.    Review of Systems     Objective:   Physical Exam  Constitutional: She is oriented to person, place, and time. She appears well-developed and well-nourished.  HENT:  Head: Normocephalic and atraumatic.  Cardiovascular: Normal rate, regular rhythm and normal heart sounds.   Pulmonary/Chest: Effort normal and breath sounds normal.  Neurological: She is alert and oriented to person, place, and time.  Skin: Skin is warm and dry.  Psychiatric: She has a normal mood and affect. Her behavior is normal.          Assessment & Plan:  HTN - Well controlled. YEAH.  She has lost 6 lbs. she has done fantastic. She is actually back on Weight Watchers. She had done weight watchers several years ago and had actually lost 50 pounds. She also plans on going back to the [facility]. Encouraged her to follow-up in a couple weeks for her diabetes.

## 2014-11-07 ENCOUNTER — Telehealth: Payer: Self-pay

## 2014-11-07 MED ORDER — BACLOFEN 10 MG PO TABS: 10.0000 mg | ORAL_TABLET | Freq: Two times a day (BID) | ORAL | Status: DC

## 2014-11-07 NOTE — Telephone Encounter (Signed)
Patient states she is waiting for a refill of the baclofen. This medications was sent on 11/02/2014.

## 2014-11-19 ENCOUNTER — Encounter: Payer: Self-pay | Admitting: Sports Medicine

## 2014-11-19 ENCOUNTER — Telehealth: Payer: Self-pay

## 2014-11-19 ENCOUNTER — Ambulatory Visit (INDEPENDENT_AMBULATORY_CARE_PROVIDER_SITE_OTHER): Payer: Medicare PPO | Admitting: Sports Medicine

## 2014-11-19 DIAGNOSIS — M21372 Foot drop, left foot: Secondary | ICD-10-CM

## 2014-11-19 DIAGNOSIS — M47896 Other spondylosis, lumbar region: Secondary | ICD-10-CM

## 2014-11-19 NOTE — Assessment & Plan Note (Signed)
Tends to have significant falls, the braces cutting into her leg, it is also very heavy limiting her mobility. She would be a good candidate for a carbon fiber AFO. Referral back to biotech to try to get this approved.  patient is aware this can take some time.

## 2014-11-19 NOTE — Progress Notes (Signed)
  Subjective:    CC: follow-up after facet injections  HPI: This is a pleasant 74 year old female with lumbar spondylosis: Recently she had facet injections of the bilateral L3-S1 joints, she had concordant pain at the L3-L4 level, and no pain at the other levels. She reported fantastic and near complete relief for several days, in pain currently is still much better than prior. Mild, persistent.  Foot drop: Wonders if a lower profile AFO could be obtained.  Past medical history, Surgical history, Family history not pertinant except as noted below, Social history, Allergies, and medications have been entered into the medical record, reviewed, and no changes needed.   Review of Systems: No fevers, chills, night sweats, weight loss, chest pain, or shortness of breath.   Objective:    General: Well Developed, well nourished, and in no acute distress.  Neuro: Alert and oriented x3, extra-ocular muscles intact, sensation grossly intact.  HEENT: Normocephalic, atraumatic, pupils equal round reactive to light, neck supple, no masses, no lymphadenopathy, thyroid nonpalpable.  Skin: Warm and dry, no rashes. Cardiac: Regular rate and rhythm, no murmurs rubs or gallops, no lower extremity edema.  Respiratory: Clear to auscultation bilaterally. Not using accessory muscles, speaking in full sentences.  Impression and Recommendations:

## 2014-11-19 NOTE — Assessment & Plan Note (Signed)
Carolyn Lambert is now post bilateral L3-S1 facet injections, she had the most concordant pain at the L3-L4 level with several days of complete pain relief.  her pain now is much better than before the injection, she has become a candidate for attempt at radiofrequency ablation.  we are going to start with the nerves innervating the L3-L4 facets bilaterally, and if successful proceed with radiofrequency ablation.

## 2014-11-19 NOTE — Telephone Encounter (Signed)
A signed Rx is needed for Carbon fiber AFO brace and faxed to [phone], in order for patient to get brace and once it is received, they will contact patient and have her to come in. Nasser Ku,CMA

## 2014-11-20 MED ORDER — AMBULATORY NON FORMULARY MEDICATION: Status: DC

## 2014-11-20 NOTE — Telephone Encounter (Signed)
Rx faxed to [phone]. Mel Langan,CMA

## 2014-11-20 NOTE — Telephone Encounter (Signed)
Prescription is waiting in my box.

## 2014-11-21 ENCOUNTER — Ambulatory Visit (INDEPENDENT_AMBULATORY_CARE_PROVIDER_SITE_OTHER): Payer: Medicare PPO | Admitting: Family Medicine

## 2014-11-21 ENCOUNTER — Encounter: Payer: Self-pay | Admitting: Family Medicine

## 2014-11-21 VITALS — BP 125/72 | HR 77 | Ht 67.0 in | Wt 243.0 lb

## 2014-11-21 DIAGNOSIS — L608 Other nail disorders: Secondary | ICD-10-CM

## 2014-11-21 DIAGNOSIS — E119 Type 2 diabetes mellitus without complications: Secondary | ICD-10-CM

## 2014-11-21 DIAGNOSIS — G4700 Insomnia, unspecified: Secondary | ICD-10-CM

## 2014-11-21 DIAGNOSIS — L609 Nail disorder, unspecified: Secondary | ICD-10-CM

## 2014-11-21 LAB — POCT GLYCOSYLATED HEMOGLOBIN (HGB A1C): Hemoglobin A1C: 6.4

## 2014-11-21 MED ORDER — HYDROCODONE-ACETAMINOPHEN 5-325 MG PO TABS: 1.0000 | ORAL_TABLET | Freq: Every evening | ORAL | Status: DC | PRN

## 2014-11-21 MED ORDER — ZOLPIDEM TARTRATE 5 MG PO TABS: 2.5000 mg | ORAL_TABLET | Freq: Every evening | ORAL | Status: DC | PRN

## 2014-11-21 NOTE — Progress Notes (Signed)
   Subjective:    Patient ID: Carolyn Lambert, female    DOB: Jul 02, 1941, 74 y.o.   MRN: [id number]  HPI Diabetes - no hypoglycemic events. No wounds or sores that are not healing well. No increased thirst or urination. Checking glucose at home. Taking medications as prescribed without any side effects. She started weight watchers about a month ago.  She has already lost 3 lbs.    Insomnia- trazodone causing a dry cough. She would like to try something else. She feels like it just makes her throat very dry and then starts coughing. She has felt a little bit more down lately.   Nail deformity for about 2 years.  Says they have very deep ridges and then been splitting. Wants to know what could cause this and what to do to improve that.  Review of Systems     Objective:   Physical Exam  Constitutional: She is oriented to person, place, and time. She appears well-developed and well-nourished.  HENT:  Head: Normocephalic and atraumatic.  Cardiovascular: Normal rate, regular rhythm and normal heart sounds.   Pulmonary/Chest: Effort normal and breath sounds normal.  Neurological: She is alert and oriented to person, place, and time.  Skin: Skin is warm and dry.  Thick horizontal ridges across the nails. She does have some splitting of the ends.  Psychiatric: She has a normal mood and affect. Her behavior is normal.          Assessment & Plan:  Diabetes-her A1c is up a little bit compared to previous. 11 A1c is 6.4 today. We will continue current regimen. She joined [employer] and is Re: Lost 3 pounds which is fantastic. Encouraged her to keep it up. She has been [place] of [place] a week encouraged her to get them more regularly. Follow-up in 3 months. She will be due for blood work in June.  Nail deformity-could recommend over-the-counter trial of biotin and possibly vitamin E for a short period of time. Also could evaluation to check her vitamin D levels, vitamin D  etc. ridge she would like to do this at her regular time for bloodwork in the summer.  Insomnia-we'll discontinue trazodone because of side effects. Recommend a trial of low-dose Ambien. One about potential side effects including sedation and sleepwalking. She says that that immediately if this happens and call the office back.  Hypertension-blood pressure looks fantastic today. Continue current regimen.

## 2014-12-09 ENCOUNTER — Telehealth: Payer: Self-pay | Admitting: *Deleted

## 2014-12-09 NOTE — Telephone Encounter (Signed)
Pt called and stated that she thought that she would be out of her ambien before 30 days. She thinks she miscounted her pills. She will call back if any problems.Carolyn Lambert

## 2014-12-15 ENCOUNTER — Other Ambulatory Visit: Payer: Self-pay | Admitting: Family Medicine

## 2014-12-18 ENCOUNTER — Other Ambulatory Visit: Payer: Self-pay | Admitting: Sports Medicine

## 2014-12-18 DIAGNOSIS — M47896 Other spondylosis, lumbar region: Secondary | ICD-10-CM

## 2014-12-23 ENCOUNTER — Other Ambulatory Visit: Payer: Self-pay | Admitting: Family Medicine

## 2014-12-23 ENCOUNTER — Other Ambulatory Visit: Payer: Self-pay

## 2014-12-23 ENCOUNTER — Ambulatory Visit
Admission: RE | Admit: 2014-12-23 | Discharge: 2014-12-23 | Disposition: A | Discharge status: Home / Self Care | Payer: Medicare PPO | Source: Ambulatory Visit | Attending: Sports Medicine | Admitting: Sports Medicine

## 2014-12-23 ENCOUNTER — Inpatient Hospital Stay: Admission: RE | Admit: 2014-12-23 | Payer: Self-pay | Source: Ambulatory Visit

## 2014-12-23 ENCOUNTER — Other Ambulatory Visit: Payer: Self-pay | Admitting: Sports Medicine

## 2014-12-23 DIAGNOSIS — M47896 Other spondylosis, lumbar region: Secondary | ICD-10-CM

## 2014-12-23 DIAGNOSIS — M545 Low back pain: Secondary | ICD-10-CM | POA: Diagnosis not present

## 2014-12-23 MED ORDER — DIAZEPAM 5 MG PO TABS
5.0000 mg | ORAL_TABLET | Freq: Once | ORAL | Status: AC
  Administered 2014-12-23: 5 mg via ORAL

## 2014-12-23 NOTE — Discharge Instructions (Signed)

## 2014-12-28 ENCOUNTER — Ambulatory Visit (HOSPITAL_BASED_OUTPATIENT_CLINIC_OR_DEPARTMENT_OTHER): Payer: Medicare PPO | Attending: Family Medicine

## 2014-12-28 VITALS — Ht 67.0 in | Wt 230.0 lb

## 2014-12-28 DIAGNOSIS — R0683 Snoring: Secondary | ICD-10-CM | POA: Insufficient documentation

## 2014-12-29 ENCOUNTER — Other Ambulatory Visit: Payer: Self-pay | Admitting: Sports Medicine

## 2014-12-29 ENCOUNTER — Other Ambulatory Visit: Payer: Self-pay

## 2014-12-29 ENCOUNTER — Ambulatory Visit
Admission: RE | Admit: 2014-12-29 | Discharge: 2014-12-29 | Disposition: A | Discharge status: Home / Self Care | Payer: Medicare PPO | Source: Ambulatory Visit | Attending: Sports Medicine | Admitting: Sports Medicine

## 2014-12-29 DIAGNOSIS — M4716 Other spondylosis with myelopathy, lumbar region: Secondary | ICD-10-CM

## 2014-12-29 DIAGNOSIS — M47896 Other spondylosis, lumbar region: Secondary | ICD-10-CM | POA: Diagnosis not present

## 2014-12-29 DIAGNOSIS — M4726 Other spondylosis with radiculopathy, lumbar region: Secondary | ICD-10-CM

## 2014-12-29 DIAGNOSIS — M21372 Foot drop, left foot: Secondary | ICD-10-CM

## 2014-12-29 MED ORDER — KETOROLAC TROMETHAMINE 30 MG/ML IJ SOLN
30.0000 mg | Freq: Once | INTRAMUSCULAR | Status: AC
  Administered 2014-12-29: 30 mg via INTRAVENOUS

## 2014-12-29 MED ORDER — MIDAZOLAM HCL 2 MG/2ML IJ SOLN
1.0000 mg | INTRAMUSCULAR | Status: DC | PRN
  Administered 2014-12-29: 1 mg via INTRAVENOUS

## 2014-12-29 MED ORDER — ONDANSETRON 8 MG PO TBDP
8.0000 mg | ORAL_TABLET | Freq: Once | ORAL | Status: AC
  Administered 2014-12-29: 8 mg via ORAL

## 2014-12-29 MED ORDER — METHYLPREDNISOLONE ACETATE 40 MG/ML INJ SUSP (RADIOLOG
120.0000 mg | Freq: Once | INTRAMUSCULAR | Status: AC
  Administered 2014-12-29: 120 mg via INTRALESIONAL

## 2014-12-29 MED ORDER — FENTANYL CITRATE 0.05 MG/ML IJ SOLN
25.0000 ug | INTRAMUSCULAR | Status: DC | PRN
  Administered 2014-12-29: 50 ug via INTRAVENOUS

## 2014-12-29 MED ORDER — SODIUM CHLORIDE 0.9 % IV SOLN
Freq: Once | INTRAVENOUS | Status: AC
  Administered 2014-12-29: 13:00:00 via INTRAVENOUS

## 2014-12-29 NOTE — Discharge Instructions (Signed)
Radio Frequency Ablation Post Procedure Discharge Instructions ° °1. May resume a regular diet and any medications that you routinely take (including pain medications). °2. No driving day of procedure. °3. Upon discharge go home and rest for at least 4 hours.  May use an ice pack as needed to injection sites on back. °4. Remove bandades later, today. ° ° ° °Please contact our office at [phone] for the following symptoms: ° °· Fever greater than 100 degrees °· Increased swelling, pain, or redness at injection site. ° ° °Thank you for visiting [facility]. °

## 2015-01-03 DIAGNOSIS — R0683 Snoring: Secondary | ICD-10-CM | POA: Diagnosis not present

## 2015-01-03 DIAGNOSIS — G4710 Hypersomnia, unspecified: Secondary | ICD-10-CM | POA: Diagnosis not present

## 2015-01-03 NOTE — Sleep Study (Signed)
   NAME: Carolyn Lambert DATE OF BIRTH:  [birth year] MEDICAL RECORD NUMBER [id number]  LOCATION: [facility]  PHYSICIAN: YOUNG,CLINTON D  DATE OF STUDY: 12/28/2014  SLEEP STUDY TYPE: Nocturnal Polysomnogram               REFERRING PHYSICIAN: Beatrice Lecher D, *  INDICATION FOR STUDY: Hypersomnia with sleep apnea  EPWORTH SLEEPINESS SCORE:   7/24 HEIGHT: 5\' 7"  (170.2 cm)  WEIGHT: 104.327 kg (230 lb)    Body mass index is 36.01 kg/(m^2).  NECK SIZE: 15 in.  MEDICATIONS: Charted for review  SLEEP ARCHITECTURE: Total sleep time 308 minutes with sleep efficiency 77.7%. Stage I was 12.7%, stage II 71.3%, stage III absent, REM 16.1% of total sleep time. Sleep latency 11.5 minutes, REM latency 248 minutes, awake after sleep onset 77 minutes, arousal index 6.0, bedtime medication: Metformin, Ambien, baclofen, atorvastatin  RESPIRATORY DATA: Apnea hypopnea index (AHI) 2.5 per hour. 13 total events scored including 4 obstructive apneas and 9 hypopneas. Events were more common while supine. REM AHI 0. There were not enough events to permit application of split protocol CPAP titration.  OXYGEN DATA: Moderate to loud snoring, worse while supine. Oxygen desaturation to a nadir of 77% and mean saturation 91% on room air.  CARDIAC DATA: Sinus rhythm with PACs  MOVEMENT/PARASOMNIA: A few incidental limb jerks were noted with little effect on sleep. Bathroom 1  IMPRESSION/ RECOMMENDATION:   1) Patient complained of back pain pending a procedure scheduled for the next day. Sleep architecture was significant for frequent spontaneous awakenings despite bedtime medications including Ambien and baclofen as listed above. 2) Occasional respiratory events with sleep disturbance, within normal limits. AHI 2.5 per hour. Events were more frequent while lying supine and snoring was louder while supine. The normal range for adults is an AHI from 0-5 events per hour.  Moderate to loud  snoring with oxygen desaturation with a nadir of 77% and mean saturation 91% on room air. Room air saturation on arrival while awake and upright was 94%.  Deneise Lever Diplomate, American Board of Sleep Medicine  ELECTRONICALLY SIGNED ON:  01/03/2015, 10:21 AM [place] PH: [phone]   FX: [phone] [place]

## 2015-01-15 ENCOUNTER — Other Ambulatory Visit: Payer: Self-pay | Admitting: Family Medicine

## 2015-01-24 ENCOUNTER — Other Ambulatory Visit: Payer: Self-pay | Admitting: Family Medicine

## 2015-02-16 ENCOUNTER — Encounter: Payer: Self-pay | Admitting: Physician Assistant

## 2015-02-16 ENCOUNTER — Ambulatory Visit (INDEPENDENT_AMBULATORY_CARE_PROVIDER_SITE_OTHER): Payer: Medicare PPO | Admitting: Physician Assistant

## 2015-02-16 VITALS — BP 141/64 | HR 86 | Ht 67.0 in | Wt 232.0 lb

## 2015-02-16 DIAGNOSIS — J0100 Acute maxillary sinusitis, unspecified: Secondary | ICD-10-CM | POA: Diagnosis not present

## 2015-02-16 DIAGNOSIS — R05 Cough: Secondary | ICD-10-CM

## 2015-02-16 DIAGNOSIS — G4700 Insomnia, unspecified: Secondary | ICD-10-CM | POA: Diagnosis not present

## 2015-02-16 DIAGNOSIS — R059 Cough, unspecified: Secondary | ICD-10-CM

## 2015-02-16 MED ORDER — AZITHROMYCIN 250 MG PO TABS: ORAL_TABLET | ORAL | Status: DC

## 2015-02-16 NOTE — Progress Notes (Signed)
   Subjective:    Patient ID: Carolyn Lambert, female    DOB: 04-11-1941, 74 y.o.   MRN: [id number]  HPI Patient is a 74 year old female who presents to the clinic with almost 1 week of sinus pressure, ear pain, facial pain, throat drainage and slightly productive cough. She has tried some over-the-counter Mucinex and Vicks vapor rub with little relief. She feels like her cough is now starting to settle in her chest. She is feeling more shortness of breath.  Patient also reports that Ambien at 5 mg is not helping her fall asleep. Dr. Charise Carwin previously put her on this dose. She wonders if she could increase.   Review of Systems  All other systems reviewed and are negative.      Objective:   Physical Exam  Constitutional: She is oriented to person, place, and time. She appears well-developed and well-nourished.  HENT:  Head: Normocephalic and atraumatic.  Nose: Nose normal.  TMs erythematous with a slight bulge and dull light reflex bilaterally.  Bilateral maxillary and frontal sinuses tenderness to palpation.  Oropharynx erythematous without any tonsillar swelling or exudate.    Eyes: Conjunctivae are normal. Right eye exhibits no discharge. Left eye exhibits no discharge.  Neck: Normal range of motion. Neck supple.  Cardiovascular: Normal rate, regular rhythm and normal heart sounds.   Pulmonary/Chest: Effort normal and breath sounds normal. She has no wheezes.  Lymphadenopathy:    She has no cervical adenopathy.  Neurological: She is alert and oriented to person, place, and time.  Skin: Skin is dry.  Psychiatric: She has a normal mood and affect. Her behavior is normal.          Assessment & Plan:  Acute maxillary sinusitis- treated with zpak. Discussed nasal saline rinses. For cough patient does have some Norco. Discuss one half tablet with an over-the-counter honey preparation. Follow-up as needed.  Insomnia-patient has appointment with Dr. Charise Carwin in 3 days. I  told her she could try to increase by one half tab the Ambien. That would be 1-1/2 tablet of 5 mg Ambien one hour before bed. She could report to [facility] if improving if not consider other therapy or increasing ambien as she deems appropriate.   Snoring- printed sleep study to go over at next visit with PCP. Had done 12/28/14 but no follow up was ever made. Placed in her box.

## 2015-02-16 NOTE — Patient Instructions (Signed)
Honey preparation with 1/2 tablet of norco.

## 2015-02-19 ENCOUNTER — Encounter: Payer: Self-pay | Admitting: Family Medicine

## 2015-02-19 ENCOUNTER — Ambulatory Visit (INDEPENDENT_AMBULATORY_CARE_PROVIDER_SITE_OTHER): Payer: Medicare PPO | Admitting: Family Medicine

## 2015-02-19 VITALS — BP 122/52 | HR 87 | Ht 67.0 in | Wt 232.0 lb

## 2015-02-19 DIAGNOSIS — E119 Type 2 diabetes mellitus without complications: Secondary | ICD-10-CM | POA: Diagnosis not present

## 2015-02-19 DIAGNOSIS — J019 Acute sinusitis, unspecified: Secondary | ICD-10-CM | POA: Diagnosis not present

## 2015-02-19 DIAGNOSIS — G4700 Insomnia, unspecified: Secondary | ICD-10-CM

## 2015-02-19 DIAGNOSIS — Z1231 Encounter for screening mammogram for malignant neoplasm of breast: Secondary | ICD-10-CM | POA: Diagnosis not present

## 2015-02-19 DIAGNOSIS — R0683 Snoring: Secondary | ICD-10-CM

## 2015-02-19 LAB — POCT UA - MICROALBUMIN
Albumin/Creatinine Ratio, Urine, POC: <30
Creatinine, POC: 100 mg/dL
Microalbumin Ur, POC: 10 mg/L

## 2015-02-19 LAB — POCT GLYCOSYLATED HEMOGLOBIN (HGB A1C): Hemoglobin A1C: 6

## 2015-02-19 MED ORDER — ZOLPIDEM TARTRATE 5 MG PO TABS: 7.5000 mg | ORAL_TABLET | Freq: Every evening | ORAL | Status: DC | PRN

## 2015-02-19 NOTE — Progress Notes (Addendum)
   Subjective:    Patient ID: Carolyn Lambert, female    DOB: 1941-04-26, 74 y.o.   MRN: [id number]  HPI Diabetes - no hypoglycemic events. No wounds or sores that are not healing well. No increased thirst or urination. Checking glucose at home. Taking medications as prescribed without any side effects. Urine micro done today.  Seh is now taking 7.5mg .  she's also in need of diabetic shoes and would like to get them from [facility]. She does have a history of previous foot ulceration as well as poor circulation of the extremities she also has a history of foot deformity including bunions.  Insomnia follow-up-when I last saw her 3 months ago we discontinued the trazodone because of side effects. I recommend a trial of low-dose Ambien.  She increased her Ambien up to 7.5 mg and says that that has been working well for her. She would like a new prescription. She denies any sedation or potential side effects.  Snoring-she's also here to review her sleep study today. She does not have sleep apnea. Her AHI was 2.5. She did have a drop in her oxygen level to 77% during the study.    Acute sinusitis-she was seen earlier this week for sinus infection. She has one more day of azithromycin. She says she still coughing a lot and has a lot of congestion. No fevers.    Review of Systems     Objective:   Physical Exam  Constitutional: She is oriented to person, place, and time. She appears well-developed and well-nourished.  HENT:  Head: Normocephalic and atraumatic.  Right Ear: External ear normal.  Left Ear: External ear normal.  Nose: Nose normal.  Mouth/Throat: Oropharynx is clear and moist.  TMs and canals are clear.   Eyes: Conjunctivae and EOM are normal. Pupils are equal, round, and reactive to light.  Neck: Neck supple. No thyromegaly present.  Cardiovascular: Normal rate, regular rhythm and normal heart sounds.   Pulmonary/Chest: Effort normal and breath sounds normal. She has  no wheezes.  Lymphadenopathy:    She has no cervical adenopathy.  Neurological: She is alert and oriented to person, place, and time.  Skin: Skin is warm and dry.  Psychiatric: She has a normal mood and affect. Her behavior is normal.          Assessment & Plan:  DM- Well controlled. A1C is 6.0.  Foot exam performed today. Continue current regimen. She is on a statin and ARB. F/U in 3 months. With her history of foot ulceration, poor peripheral circulation and foot deformity I think she would be a good candidate for diabetic shoes and inserts. Will complete form and prescription.  Insomnia-Ambien 7.5 mg daily seems to work well for her. We'll continue current regimen. 5 mg is what is approved by the FDA for women so she may have a difficult time filling it at the pharmacy. That has made a big difference in how she feels daily. Next  Acute sinusitis-not feeling tremendously better. She still has one more day of azithromycin. I told her to call on Monday after the weekend if she still not feeling well.  Discussed due for mammogram . Order placed. She would like to have it performed downstairs.  Snoring-no sleep apnea. I will call the sleep lab on Monday just to make sure that the drop in oxygen to 77% was not significant. I want to make sure that she does not require overnight oxygen.

## 2015-02-20 ENCOUNTER — Telehealth: Payer: Self-pay | Admitting: Family Medicine

## 2015-02-20 NOTE — Telephone Encounter (Signed)
Began PA for Zolpidem 5mg  tablets via CoverMyMeds. Pending review. Case #[id number].

## 2015-02-23 ENCOUNTER — Telehealth: Payer: Self-pay | Admitting: *Deleted

## 2015-02-23 DIAGNOSIS — G4734 Idiopathic sleep related nonobstructive alveolar hypoventilation: Secondary | ICD-10-CM

## 2015-02-23 NOTE — Telephone Encounter (Signed)
[name] from the sleep center returned my call stating that they are unaware of the length of time Carolyn Lambert was under 77% but that she was under 88% for 68 minutes.

## 2015-02-23 NOTE — Telephone Encounter (Signed)
Received fax from [facility] and medication is approved until 02/20/2016 - CF

## 2015-02-24 ENCOUNTER — Telehealth: Payer: Self-pay | Admitting: Family Medicine

## 2015-02-24 DIAGNOSIS — G4734 Idiopathic sleep related nonobstructive alveolar hypoventilation: Secondary | ICD-10-CM | POA: Insufficient documentation

## 2015-02-24 MED ORDER — AMOXICILLIN-POT CLAVULANATE 875-125 MG PO TABS: 1.0000 | ORAL_TABLET | Freq: Two times a day (BID) | ORAL | Status: DC

## 2015-02-24 MED ORDER — AMBULATORY NON FORMULARY MEDICATION: Status: DC

## 2015-02-24 NOTE — Telephone Encounter (Signed)
Ok, new antibiotic sent in.

## 2015-02-24 NOTE — Telephone Encounter (Signed)
Left message for patient to return call.

## 2015-02-24 NOTE — Telephone Encounter (Signed)
Pt called and lvm stating that she is not getting any better from the Abx that she was given when she was seen. She was told to call back if not better. Will fwd to pcp for advice.Carolyn Lambert [place]

## 2015-02-24 NOTE — Telephone Encounter (Signed)
Please call patient and let her know about the results about the low oxygen. Since she is under 88% for such a significant amount of time the recommendation would be for her to wear oxygen overnight while she is sleeping. We will go ahead and work on getting that order placed in faxed over to home health. When she gets her supplies and starts using the oxygen at night I would like to see her back in about 6 weeks just to make sure that she's doing well with it not having any palms or concerns.

## 2015-02-25 ENCOUNTER — Other Ambulatory Visit: Payer: Self-pay | Admitting: *Deleted

## 2015-02-25 MED ORDER — ATORVASTATIN CALCIUM 20 MG PO TABS: 20.0000 mg | ORAL_TABLET | Freq: Every day | ORAL | Status: DC

## 2015-02-25 NOTE — Telephone Encounter (Signed)
Left message on patient vm advising that new antibiotic was sent to pharmacy. Rhonda Cunningham,CMA\

## 2015-02-26 ENCOUNTER — Other Ambulatory Visit: Payer: Self-pay | Admitting: Family Medicine

## 2015-03-02 ENCOUNTER — Encounter: Payer: Self-pay | Admitting: Family Medicine

## 2015-03-16 ENCOUNTER — Other Ambulatory Visit: Payer: Self-pay | Admitting: Family Medicine

## 2015-03-16 NOTE — Telephone Encounter (Signed)
Received fax from [place], Pt wants no supplies to go to this pharmacy.

## 2015-03-18 ENCOUNTER — Telehealth: Payer: Self-pay | Admitting: Family Medicine

## 2015-03-18 NOTE — Telephone Encounter (Signed)
Pt did get oxygen. Carolyn Lambert, Carolyn Lambert

## 2015-03-18 NOTE — Telephone Encounter (Signed)
Please call patient to verify that she did get the oxygen for overnight use.

## 2015-03-19 ENCOUNTER — Other Ambulatory Visit: Payer: Self-pay | Admitting: Family Medicine

## 2015-03-30 DIAGNOSIS — R0902 Hypoxemia: Secondary | ICD-10-CM | POA: Diagnosis not present

## 2015-03-30 DIAGNOSIS — G4734 Idiopathic sleep related nonobstructive alveolar hypoventilation: Secondary | ICD-10-CM | POA: Diagnosis not present

## 2015-03-31 DIAGNOSIS — E119 Type 2 diabetes mellitus without complications: Secondary | ICD-10-CM | POA: Diagnosis not present

## 2015-04-01 ENCOUNTER — Ambulatory Visit (INDEPENDENT_AMBULATORY_CARE_PROVIDER_SITE_OTHER): Payer: Medicare PPO

## 2015-04-01 DIAGNOSIS — Z1231 Encounter for screening mammogram for malignant neoplasm of breast: Secondary | ICD-10-CM

## 2015-04-05 ENCOUNTER — Other Ambulatory Visit: Payer: Self-pay | Admitting: Family Medicine

## 2015-04-22 ENCOUNTER — Other Ambulatory Visit: Payer: Self-pay | Admitting: Family Medicine

## 2015-04-29 DIAGNOSIS — R0902 Hypoxemia: Secondary | ICD-10-CM | POA: Diagnosis not present

## 2015-04-29 DIAGNOSIS — E119 Type 2 diabetes mellitus without complications: Secondary | ICD-10-CM | POA: Diagnosis not present

## 2015-04-29 DIAGNOSIS — G4734 Idiopathic sleep related nonobstructive alveolar hypoventilation: Secondary | ICD-10-CM | POA: Diagnosis not present

## 2015-04-29 LAB — HM DIABETES EYE EXAM

## 2015-05-04 ENCOUNTER — Other Ambulatory Visit: Payer: Self-pay | Admitting: Family Medicine

## 2015-05-06 ENCOUNTER — Encounter: Payer: Self-pay | Admitting: Family Medicine

## 2015-05-22 ENCOUNTER — Ambulatory Visit: Payer: Self-pay | Admitting: Family Medicine

## 2015-05-25 ENCOUNTER — Ambulatory Visit: Payer: Self-pay | Admitting: Family Medicine

## 2015-05-30 DIAGNOSIS — G4734 Idiopathic sleep related nonobstructive alveolar hypoventilation: Secondary | ICD-10-CM | POA: Diagnosis not present

## 2015-05-30 DIAGNOSIS — R0902 Hypoxemia: Secondary | ICD-10-CM | POA: Diagnosis not present

## 2015-06-02 ENCOUNTER — Ambulatory Visit (INDEPENDENT_AMBULATORY_CARE_PROVIDER_SITE_OTHER): Payer: Medicare PPO | Admitting: Family Medicine

## 2015-06-02 ENCOUNTER — Other Ambulatory Visit: Payer: Self-pay | Admitting: Family Medicine

## 2015-06-02 ENCOUNTER — Encounter: Payer: Self-pay | Admitting: Family Medicine

## 2015-06-02 VITALS — BP 166/76 | HR 78 | Wt 231.0 lb

## 2015-06-02 DIAGNOSIS — E119 Type 2 diabetes mellitus without complications: Secondary | ICD-10-CM

## 2015-06-02 DIAGNOSIS — F331 Major depressive disorder, recurrent, moderate: Secondary | ICD-10-CM | POA: Diagnosis not present

## 2015-06-02 DIAGNOSIS — R208 Other disturbances of skin sensation: Secondary | ICD-10-CM

## 2015-06-02 DIAGNOSIS — R200 Anesthesia of skin: Secondary | ICD-10-CM

## 2015-06-02 DIAGNOSIS — R0902 Hypoxemia: Secondary | ICD-10-CM | POA: Diagnosis not present

## 2015-06-02 DIAGNOSIS — Z23 Encounter for immunization: Secondary | ICD-10-CM | POA: Diagnosis not present

## 2015-06-02 LAB — POCT GLYCOSYLATED HEMOGLOBIN (HGB A1C): Hemoglobin A1C: 6.1

## 2015-06-02 NOTE — Patient Instructions (Signed)
Decrease zoloft to 1.5 tabs daily for 2 weeks, then decrease to 1 tab daily for 2 weeks. Then decrease to 1/2 tab daily for 2 weeks.  Then call and we can send in 25mg  dose.

## 2015-06-02 NOTE — Progress Notes (Signed)
Subjective:    Patient ID: Carolyn Lambert, female    DOB: 09/05/1941, 74 y.o.   MRN: [id number]  HPI Nocturnal hypoxia - we had called her 3 months ago to get her set up for nightime oxygen. ew verified that she did receive the oxygen in June. She has been wearing it for at least part of the night but usually wakes up pulling the oxygen tubing off. She does try to at least wear it every night for part of the time.  Diabetes - no hypoglycemic events. No wounds or sores that are not healing well. No increased thirst or urination. Checking glucose at home. Taking medications as prescribed without any side effects.  Depression - she has been on zoloft since 1995. ays she would like to taper off.  Says getting into a deep depression every 6 weeks so.  She sleeps a lot and doesn't want to leave the apartment.    She's also worried about the possibility of an aneurysm. Her father had one in the abdomen. He was a smoker. She has never been a smoker.  Numbness in her hands. Worse on the right.  Comes and goes. On her right side she's also having pain in her right shoulder. She has hardware in that shoulder and sometimes it will even go up towards her neck.  Review of Systems     Objective:   Physical Exam  Constitutional: She is oriented to person, place, and time. She appears well-developed and well-nourished.  HENT:  Head: Normocephalic and atraumatic.  Cardiovascular: Normal rate, regular rhythm and normal heart sounds.   Pulmonary/Chest: Effort normal and breath sounds normal.  Musculoskeletal:  She has tenderness and palpable knots in both trapezius muscles near her neck. Negative Tinel's sign in the left wrist but negative in the right wrist. Negative Phalen's. Hands and wrists with normal range of motion.  Neurological: She is alert and oriented to person, place, and time.  Skin: Skin is warm and dry.  Psychiatric: She has a normal mood and affect. Her behavior is normal.           Assessment & Plan:  Depression - PHQ- 9 score of 13.  Uncontrolled. She really just feels like the Zoloft is not working well since she seems to be cycling into a depressive episode almost every 6 weeks. We'll slowly wean the medication and consider replacing with another medication that she really wants to try the Wellbutrin by itself. Taper written out. Will follow-up in one month.  Nocturnal hypoxia - doing well overall but encouraged her to keep the oxygen on as long as she can at night. Explained this can make a big difference in how heart her heart has to work as well as sleepiness and sedation the following day.   DM- A1C is 6.1.  Well controlled. Continue current regimen. Follow up in 3 months.  Numbness in her hands. Worse on the right. Consider could be carpal tunnel on the left hand. On the right-hand it's not clear if this dissection coming from her neck or her shoulder. Certainly we could consider evaluating with an EMG. She says she wants to hold off on that and says will just work on the muscle tissue in her upper back first and see if she notices if her hands improved.   Upper back pain - she does have some palpable knots in the muscle skeletal tissue. Recommend massage, gentle stretches, and heat wrap. If not improving then please let me  know and can consider physical therapy.  Compression stockings - she was goin high stocking.  She gave [name] a number to call for her insurance because she says it is covered under durable medical equipment under her [employer]. We [place] places [place] none of them will take an process insurance for compression stockings. We will call her insurance to see if she may be able to pay for them up front and then just submit the receipt for some type of partial reimbursement.  She has some difficulty getting her diabetic shoes. She will be going to another company. I told her just to have them fax over new form when she  goes and we will be happy to complete it. She is also getting a new brace for her left leg that Dr. Dianah Field, or sports medicine doctor has prescribed.

## 2015-06-03 ENCOUNTER — Telehealth: Payer: Self-pay | Admitting: Family Medicine

## 2015-06-03 NOTE — Telephone Encounter (Signed)
Please call patient and let her know that we did call her insurance company regarding DME for her compression stocking. . They said they cannot guarantee coverage or reimbursement but she can certainly uby the compression stockings out right and then submit the payment for possible reimbursement. The reimbursement form will be mailed to her for completion. Reference number for the call was [id number].

## 2015-06-09 ENCOUNTER — Other Ambulatory Visit: Payer: Self-pay | Admitting: Family Medicine

## 2015-06-09 NOTE — Telephone Encounter (Signed)
Left information on Pt's voicemail, advised if she finds a location to make her custom compression stockings and needs an Rx we would be happy to write one. Provided callback information for any further questions.

## 2015-06-16 ENCOUNTER — Other Ambulatory Visit: Payer: Self-pay | Admitting: Family Medicine

## 2015-06-30 ENCOUNTER — Ambulatory Visit: Payer: Self-pay | Admitting: Family Medicine

## 2015-06-30 DIAGNOSIS — R0902 Hypoxemia: Secondary | ICD-10-CM | POA: Diagnosis not present

## 2015-06-30 DIAGNOSIS — G4734 Idiopathic sleep related nonobstructive alveolar hypoventilation: Secondary | ICD-10-CM | POA: Diagnosis not present

## 2015-07-06 ENCOUNTER — Other Ambulatory Visit: Payer: Self-pay | Admitting: Family Medicine

## 2015-07-06 DIAGNOSIS — E119 Type 2 diabetes mellitus without complications: Secondary | ICD-10-CM | POA: Diagnosis not present

## 2015-07-06 DIAGNOSIS — M21372 Foot drop, left foot: Secondary | ICD-10-CM | POA: Diagnosis not present

## 2015-07-06 DIAGNOSIS — L84 Corns and callosities: Secondary | ICD-10-CM | POA: Diagnosis not present

## 2015-07-14 ENCOUNTER — Other Ambulatory Visit: Payer: Self-pay | Admitting: Family Medicine

## 2015-07-14 ENCOUNTER — Encounter: Payer: Self-pay | Admitting: Family Medicine

## 2015-07-14 ENCOUNTER — Ambulatory Visit (INDEPENDENT_AMBULATORY_CARE_PROVIDER_SITE_OTHER): Payer: Medicare PPO | Admitting: Family Medicine

## 2015-07-14 VITALS — BP 148/57 | HR 74 | Ht 67.0 in | Wt 225.0 lb

## 2015-07-14 DIAGNOSIS — M545 Low back pain, unspecified: Secondary | ICD-10-CM

## 2015-07-14 DIAGNOSIS — F331 Major depressive disorder, recurrent, moderate: Secondary | ICD-10-CM | POA: Diagnosis not present

## 2015-07-14 DIAGNOSIS — G8929 Other chronic pain: Secondary | ICD-10-CM

## 2015-07-14 MED ORDER — SERTRALINE HCL 25 MG PO TABS: 25.0000 mg | ORAL_TABLET | Freq: Every day | ORAL | Status: DC

## 2015-07-14 MED ORDER — HYDROCODONE-ACETAMINOPHEN 5-325 MG PO TABS: 1.0000 | ORAL_TABLET | Freq: Every evening | ORAL | Status: DC | PRN

## 2015-07-14 MED ORDER — DULOXETINE HCL 30 MG PO CPEP: 30.0000 mg | ORAL_CAPSULE | Freq: Every day | ORAL | Status: DC

## 2015-07-14 NOTE — Progress Notes (Signed)
   Subjective:    Patient ID: Carolyn Lambert, female    DOB: [birth year], 74 y.o.   MRN: [id number]  HPI Depression - She has been weaning her zoloft. She is doing well. Did feel more down for about 2 weeks but wasn't as seere. She did get out very much but she did land at all day like she's often does when this happens. She's having some frustrations with her husband. She's wondering if he might even be getting a little bit of memory problems. She's also noticed that his head tremor..   Would like a refill on her hydrocodone. Last filled in Feb for 30 tabs. She uses ice and heat.  Mostly bohters her at night.    Review of Systems     Objective:   Physical Exam  Constitutional: She is oriented to person, place, and time. She appears well-developed and well-nourished.  HENT:  Head: Normocephalic and atraumatic.  Cardiovascular: Normal rate, regular rhythm and normal heart sounds.   Pulmonary/Chest: Effort normal and breath sounds normal.  Neurological: She is alert and oriented to person, place, and time.  Skin: Skin is warm and dry.  Psychiatric: She has a normal mood and affect. Her behavior is normal.          Assessment & Plan:  Depression -  We'll continue to wean the Zoloft. Right now she's doing okay. Her PHQ 9 score is 10 today and she rates her symptoms is somewhat difficult.   We discussed several different options. recommendl start Cymbalta.   We'll see her back in about 5 weeks and we can adjust the medication at time. I think would also help with her chronic muscle skeletal pain in her back so we can get to benefit that would be wonderful. Next   chronic low back pain-will refill hydrocodone today.  prescriptionn given to patient.   diabetes- she did get her diabetic shoes and a section wearing them today.

## 2015-07-30 DIAGNOSIS — R0902 Hypoxemia: Secondary | ICD-10-CM | POA: Diagnosis not present

## 2015-07-30 DIAGNOSIS — G4734 Idiopathic sleep related nonobstructive alveolar hypoventilation: Secondary | ICD-10-CM | POA: Diagnosis not present

## 2015-08-18 ENCOUNTER — Ambulatory Visit (INDEPENDENT_AMBULATORY_CARE_PROVIDER_SITE_OTHER): Payer: Medicare PPO | Admitting: Family Medicine

## 2015-08-18 ENCOUNTER — Encounter: Payer: Self-pay | Admitting: Family Medicine

## 2015-08-18 VITALS — BP 116/54 | HR 67 | Wt 221.0 lb

## 2015-08-18 DIAGNOSIS — E119 Type 2 diabetes mellitus without complications: Secondary | ICD-10-CM

## 2015-08-18 DIAGNOSIS — F332 Major depressive disorder, recurrent severe without psychotic features: Secondary | ICD-10-CM | POA: Diagnosis not present

## 2015-08-18 DIAGNOSIS — R7989 Other specified abnormal findings of blood chemistry: Secondary | ICD-10-CM

## 2015-08-18 DIAGNOSIS — Z23 Encounter for immunization: Secondary | ICD-10-CM | POA: Diagnosis not present

## 2015-08-18 DIAGNOSIS — R748 Abnormal levels of other serum enzymes: Secondary | ICD-10-CM

## 2015-08-18 DIAGNOSIS — Z780 Asymptomatic menopausal state: Secondary | ICD-10-CM | POA: Diagnosis not present

## 2015-08-18 MED ORDER — ATORVASTATIN CALCIUM 20 MG PO TABS: 20.0000 mg | ORAL_TABLET | Freq: Every day | ORAL | Status: DC

## 2015-08-18 MED ORDER — METFORMIN HCL 1000 MG PO TABS: 1000.0000 mg | ORAL_TABLET | Freq: Two times a day (BID) | ORAL | Status: DC

## 2015-08-18 MED ORDER — LOSARTAN POTASSIUM-HCTZ 100-25 MG PO TABS: 1.0000 | ORAL_TABLET | Freq: Every day | ORAL | Status: DC

## 2015-08-18 MED ORDER — ZOLPIDEM TARTRATE 5 MG PO TABS: 7.5000 mg | ORAL_TABLET | Freq: Once | ORAL | Status: DC

## 2015-08-18 MED ORDER — DULOXETINE HCL 60 MG PO CPEP: 60.0000 mg | ORAL_CAPSULE | Freq: Every day | ORAL | Status: DC

## 2015-08-18 NOTE — Progress Notes (Signed)
   Subjective:    Patient ID: Carolyn Lambert, female    DOB: 24-Mar-1941, 74 y.o.   MRN: [id number]  HPI Here to follow-up for major depressive disorder. We decided to wean the Zoloft and put her on Cymbalta. She's been doing okay overall except she has had a sinus infection in the last several weeks and that has kept her in the house which has made her feel more down and sad because she has been able to get out very much. She is getting much better from her sinus infection and is feeling some better. That she hasn't started getting out again yet. She's not had any major concerns with the Cymbalta except for the fact that it has made her a little more sleepy so she made it to bedtime and that has been working well. She denies any thoughts of wine to harm herself. And she is completely off the Zoloft at this point.she does feel Cymbalta may be helping with some of her back pain.  She still complains of feeling Little pleasure in doing things more than half the days and feeling down more than half the days. She also complains of feeling generally fatigued daily.   Review of Systems     Objective:   Physical Exam  Constitutional: She is oriented to person, place, and time. She appears well-developed and well-nourished.  HENT:  Head: Normocephalic and atraumatic.  Cardiovascular: Normal rate, regular rhythm and normal heart sounds.   Pulmonary/Chest: Effort normal and breath sounds normal.  Neurological: She is alert and oriented to person, place, and time.  Skin: Skin is warm and dry.  Psychiatric: She has a normal mood and affect. Her behavior is normal.          Assessment & Plan:  Major depressive disorder-stable-PHQ 9 score 15 today which is up from previous though this is expected with the transition medications. We'll increase Cymbalta to 60 mg to see if this is more effective for her and also helps with her muscle skeletal pain.strongly encouraged her to get out of the house  and get back to the [facility] as soon as she is over her sinus infection. It really makes a big difference in her mood.   Diabetes-she's overdue for blood work sent lab slip provided today. I'll see her back in about one month to follow-up for her next hemoglobin A1c checked.  Due for bone density test. Ordered today. Next  Due for pneumonia vaccination. Given today.  Time spent 20 min, > 50% spent counseling about her depression.

## 2015-08-30 DIAGNOSIS — R0902 Hypoxemia: Secondary | ICD-10-CM | POA: Diagnosis not present

## 2015-08-30 DIAGNOSIS — G4734 Idiopathic sleep related nonobstructive alveolar hypoventilation: Secondary | ICD-10-CM | POA: Diagnosis not present

## 2015-09-01 ENCOUNTER — Other Ambulatory Visit: Payer: Self-pay | Admitting: Family Medicine

## 2015-09-08 ENCOUNTER — Other Ambulatory Visit: Payer: Self-pay

## 2015-09-08 MED ORDER — DULOXETINE HCL 60 MG PO CPEP: 60.0000 mg | ORAL_CAPSULE | Freq: Every day | ORAL | Status: DC

## 2015-09-11 ENCOUNTER — Other Ambulatory Visit: Payer: Self-pay | Admitting: Family Medicine

## 2015-09-11 MED ORDER — DULOXETINE HCL 60 MG PO CPEP: 60.0000 mg | ORAL_CAPSULE | Freq: Every day | ORAL | Status: DC

## 2015-09-15 ENCOUNTER — Encounter: Payer: Self-pay | Admitting: Family Medicine

## 2015-09-15 ENCOUNTER — Ambulatory Visit (INDEPENDENT_AMBULATORY_CARE_PROVIDER_SITE_OTHER): Payer: Medicare PPO | Admitting: Family Medicine

## 2015-09-15 VITALS — BP 133/64 | HR 70 | Ht 67.0 in | Wt 224.1 lb

## 2015-09-15 DIAGNOSIS — G4700 Insomnia, unspecified: Secondary | ICD-10-CM

## 2015-09-15 DIAGNOSIS — E119 Type 2 diabetes mellitus without complications: Secondary | ICD-10-CM | POA: Diagnosis not present

## 2015-09-15 DIAGNOSIS — F331 Major depressive disorder, recurrent, moderate: Secondary | ICD-10-CM

## 2015-09-15 DIAGNOSIS — F5104 Psychophysiologic insomnia: Secondary | ICD-10-CM

## 2015-09-15 LAB — POCT GLYCOSYLATED HEMOGLOBIN (HGB A1C): Hemoglobin A1C: 6

## 2015-09-15 MED ORDER — ZOLPIDEM TARTRATE 5 MG PO TABS: 7.5000 mg | ORAL_TABLET | Freq: Once | ORAL | Status: DC

## 2015-09-15 NOTE — Progress Notes (Signed)
   Subjective:    Patient ID: Carolyn Lambert, female    DOB: 03-24-1941, 75 y.o.   MRN: [id number]  HPI Diabetes - no hypoglycemic events. No wounds or sores that are not healing well. No increased thirst or urination. Checking glucose at home. Taking medications as prescribed without any side effects.  MDD - we inc her Cymbalta to 60 mg a month ago.  I had encouraged her to get back into exercise. She is also on Wellbutrin and takes Ambien for sleep. Says she is sleeping a lot. She was off of her medication for a few days  She does feel like it is working well   Insomnia - Doing well on the [place]. Denies feeling groggy on the medication.   Review of Systems     Objective:   Physical Exam  Constitutional: She is oriented to person, place, and time. She appears well-developed and well-nourished.  HENT:  Head: Normocephalic and atraumatic.  Cardiovascular: Normal rate, regular rhythm and normal heart sounds.   Pulmonary/Chest: Effort normal and breath sounds normal.  Neurological: She is alert and oriented to person, place, and time.  Skin: Skin is warm and dry.  Psychiatric: She has a normal mood and affect. Her behavior is normal.          Assessment & Plan:  DM- well controlled.  A1C is 6.0 today. Foot exam performed today. Follow-up in 3-4 months. Continue current regimen.  MDD - PHQ 9 score 15 today. Not maximally controlled but she was out of her medication for several days and she feels like that really affected her mood. We'll continue with current regimen. She has plenty of refills for the next few months. And I will see her back in about 2 months to make sure that she's doing okay.   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  She never received a phone call about scheduling her bone density exam that we had ordered back in November. We will try to call and get her an appointment today.

## 2015-09-24 ENCOUNTER — Other Ambulatory Visit: Payer: Self-pay

## 2015-09-29 DIAGNOSIS — G4734 Idiopathic sleep related nonobstructive alveolar hypoventilation: Secondary | ICD-10-CM | POA: Diagnosis not present

## 2015-09-29 DIAGNOSIS — R0902 Hypoxemia: Secondary | ICD-10-CM | POA: Diagnosis not present

## 2015-10-07 DIAGNOSIS — M21372 Foot drop, left foot: Secondary | ICD-10-CM | POA: Diagnosis not present

## 2015-10-30 ENCOUNTER — Other Ambulatory Visit: Payer: Self-pay | Admitting: Family Medicine

## 2015-11-02 ENCOUNTER — Other Ambulatory Visit: Payer: Self-pay | Admitting: Family Medicine

## 2015-11-16 ENCOUNTER — Ambulatory Visit: Payer: Self-pay | Admitting: Family Medicine

## 2015-11-25 ENCOUNTER — Ambulatory Visit: Payer: Self-pay | Admitting: Family Medicine

## 2015-11-30 ENCOUNTER — Other Ambulatory Visit: Payer: Self-pay | Admitting: *Deleted

## 2015-11-30 ENCOUNTER — Other Ambulatory Visit: Payer: Self-pay | Admitting: Family Medicine

## 2015-11-30 DIAGNOSIS — F332 Major depressive disorder, recurrent severe without psychotic features: Secondary | ICD-10-CM

## 2015-11-30 DIAGNOSIS — F4310 Post-traumatic stress disorder, unspecified: Secondary | ICD-10-CM

## 2015-11-30 MED ORDER — BUPROPION HCL ER (XL) 150 MG PO TB24: 150.0000 mg | ORAL_TABLET | Freq: Two times a day (BID) | ORAL | Status: DC

## 2015-12-07 ENCOUNTER — Other Ambulatory Visit: Payer: Self-pay | Admitting: *Deleted

## 2015-12-07 DIAGNOSIS — F4310 Post-traumatic stress disorder, unspecified: Secondary | ICD-10-CM

## 2015-12-15 ENCOUNTER — Telehealth: Payer: Self-pay | Admitting: Family Medicine

## 2015-12-15 NOTE — Telephone Encounter (Signed)
I called patient and scheduled her for Diabetes and insomnia f/u on 12-31-15 with her pcp

## 2015-12-16 ENCOUNTER — Telehealth: Payer: Self-pay | Admitting: Family Medicine

## 2015-12-16 ENCOUNTER — Telehealth: Payer: Self-pay | Admitting: *Deleted

## 2015-12-16 NOTE — Telephone Encounter (Signed)
Call patient: Her insurance will not cover Ambien. I know she is Artie tried trazodone in the past and failed it. They will cover Belsomra or Rozerem. If she would like to try one of these them please let me know.

## 2015-12-16 NOTE — Telephone Encounter (Signed)
closed

## 2015-12-16 NOTE — Telephone Encounter (Signed)
Pt brought in new insurance card to be filed and ran.Carolyn Lambert

## 2015-12-16 NOTE — Telephone Encounter (Signed)
Left message on patient's vm to let us know if she will be willing to try alternative meds

## 2015-12-30 ENCOUNTER — Other Ambulatory Visit: Payer: Self-pay | Admitting: Family Medicine

## 2015-12-31 ENCOUNTER — Ambulatory Visit (INDEPENDENT_AMBULATORY_CARE_PROVIDER_SITE_OTHER): Payer: Medicare Other | Admitting: Family Medicine

## 2015-12-31 ENCOUNTER — Encounter: Payer: Self-pay | Admitting: Family Medicine

## 2015-12-31 VITALS — BP 135/63 | HR 73 | Wt 220.0 lb

## 2015-12-31 DIAGNOSIS — E119 Type 2 diabetes mellitus without complications: Secondary | ICD-10-CM | POA: Diagnosis not present

## 2015-12-31 DIAGNOSIS — F322 Major depressive disorder, single episode, severe without psychotic features: Secondary | ICD-10-CM

## 2015-12-31 DIAGNOSIS — Z780 Asymptomatic menopausal state: Secondary | ICD-10-CM

## 2015-12-31 DIAGNOSIS — G4700 Insomnia, unspecified: Secondary | ICD-10-CM | POA: Diagnosis not present

## 2015-12-31 LAB — POCT GLYCOSYLATED HEMOGLOBIN (HGB A1C): Hemoglobin A1C: 5.8

## 2015-12-31 NOTE — Progress Notes (Signed)
   Subjective:    Patient ID: Carolyn Lambert, female    DOB: January 14, 1941, 75 y.o.   MRN: [id number]  HPI Diabetes - no hypoglycemic events. No wounds or sores that are not healing well. No increased thirst or urination. Checking glucose at home. Taking medications as prescribed without any side effects.  Insomnia - Been able to get her sleep medication. Her insurance will no longer pay for Ambien. She has already tried trazodone in the past and sent a new perception of her for a serum which we were told should be covered by her insurance but unfortunately told her it would cost $300 so she was not able to pick it up.  MDD -  she still complains of feeling down and depressed more than half the days and difficulty with sleep and low energy. Her husband brother just passed away and they had to drive back and forth to [place] a couple of times. She says this last year she's been really stressful for her overall.   Review of Systems     Objective:   Physical Exam  Constitutional: She is oriented to person, place, and time. She appears well-developed and well-nourished.  HENT:  Head: Normocephalic and atraumatic.  Cardiovascular: Normal rate, regular rhythm and normal heart sounds.   Pulmonary/Chest: Effort normal and breath sounds normal.  Neurological: She is alert and oriented to person, place, and time.  Skin: Skin is warm and dry.  Psychiatric: She has a normal mood and affect. Her behavior is normal.          Assessment & Plan:  DM- 11 A1c of 5.8 today, previous of 6. This looks fantastic. Continue current regimen. Follow-up in 3 months.  Insomnia - will try to cardiovert the pharmacy and find out why they are not covering the Rozerem when the fourth of a sent to me said that it should be covered.  MDD - not well controlled. Her PHQ 9 score is 19 today. We discussed possible referral to a psychiatrist but she declines at this time. She says that she has had a lot of  stressful things going on in her life right now and she also think she would feel better if she were sleeping regularly. Encouraged her to strongly think about it. I did not did not make any changes with her medication regimen today.

## 2015-12-31 NOTE — Telephone Encounter (Signed)
Called patient and let her know the rozererem is $61.80 and this cost is going toward her deductable. Asked that she call back with any concerns or questions

## 2016-01-06 LAB — COMPLETE METABOLIC PANEL WITH GFR
ALT: 9 U/L (ref 6–29)
AST: 11 U/L (ref 10–35)
Albumin: 4.1 g/dL (ref 3.6–5.1)
Alkaline Phosphatase: 64 U/L (ref 33–130)
BUN: 28 mg/dL — ABNORMAL HIGH (ref 7–25)
CO2: 31 mmol/L (ref 20–31)
Calcium: 9.5 mg/dL (ref 8.6–10.4)
Chloride: 105 mmol/L (ref 98–110)
Creat: 1.15 mg/dL — ABNORMAL HIGH (ref 0.60–0.93)
GFR, Est African American: 54 mL/min — ABNORMAL LOW (ref >=60)
GFR, Est Non African American: 47 mL/min — ABNORMAL LOW (ref >=60)
Glucose, Bld: 107 mg/dL — ABNORMAL HIGH (ref 65–99)
Potassium: 5.3 mmol/L (ref 3.5–5.3)
Sodium: 143 mmol/L (ref 135–146)
Total Bilirubin: 0.4 mg/dL (ref 0.2–1.2)
Total Protein: 6.2 g/dL (ref 6.1–8.1)

## 2016-01-06 LAB — LIPID PANEL
Cholesterol: 146 mg/dL (ref 125–200)
HDL: 61 mg/dL (ref >=46)
LDL Cholesterol: 62 mg/dL (ref <130)
Total CHOL/HDL Ratio: 2.4 Ratio (ref <=5.0)
Triglycerides: 116 mg/dL (ref <150)
VLDL: 23 mg/dL (ref <30)

## 2016-01-06 NOTE — Addendum Note (Signed)
Addended by: Teddy Spike on: 01/06/2016 02:07 PM   Modules accepted: Orders

## 2016-01-11 ENCOUNTER — Other Ambulatory Visit: Payer: Self-pay | Admitting: Family Medicine

## 2016-01-19 ENCOUNTER — Ambulatory Visit: Payer: Self-pay | Admitting: Family Medicine

## 2016-01-28 ENCOUNTER — Other Ambulatory Visit: Payer: Self-pay | Admitting: Family Medicine

## 2016-02-04 ENCOUNTER — Ambulatory Visit: Payer: Self-pay | Admitting: Family Medicine

## 2016-02-05 ENCOUNTER — Encounter: Payer: Self-pay | Admitting: Family Medicine

## 2016-02-05 ENCOUNTER — Other Ambulatory Visit: Payer: Self-pay | Admitting: Family Medicine

## 2016-02-05 ENCOUNTER — Ambulatory Visit (INDEPENDENT_AMBULATORY_CARE_PROVIDER_SITE_OTHER): Payer: Medicare Other | Admitting: Family Medicine

## 2016-02-05 VITALS — BP 122/60 | HR 79 | Wt 233.0 lb

## 2016-02-05 DIAGNOSIS — E041 Nontoxic single thyroid nodule: Secondary | ICD-10-CM | POA: Diagnosis not present

## 2016-02-05 DIAGNOSIS — R1310 Dysphagia, unspecified: Secondary | ICD-10-CM | POA: Diagnosis not present

## 2016-02-05 DIAGNOSIS — R5383 Other fatigue: Secondary | ICD-10-CM

## 2016-02-05 DIAGNOSIS — F332 Major depressive disorder, recurrent severe without psychotic features: Secondary | ICD-10-CM

## 2016-02-05 DIAGNOSIS — H9201 Otalgia, right ear: Secondary | ICD-10-CM

## 2016-02-05 NOTE — Patient Instructions (Addendum)
Can try light therapy in the fall and winter.

## 2016-02-05 NOTE — Progress Notes (Signed)
Subjective:    Patient ID: Carolyn Lambert, female    DOB: [birth year], 75 y.o.   MRN: [id number]  HPI Patient comes in today with several concerns. She's having right ear pain. She describes it as sharp. She's not sure if it may be related to allergies or not. The pollen has been high here. She denies any fevers chills or sweats or cough or other upper a story symptoms. She says it's just intermittent. She could not afford the new sleep aid and reports she still not sleeping great.  She also is concerned because she feels like food is actually getting stuck in the back of her throat. No prior history of esophageal stricture or lesions. She says it started about 6 weeks ago. She's even had to vomit occasionally to the point to remove food that's been obstructed. Denies any heartburn or reflux symptoms. No abdominal pain.  She's felt more fatigued recently and would like to have some lab work checked including B12 and thyroid in particular. She is overall feels like she's had low energy levels. Even though she feels that her mood is improved significantly. She denies any abnormal fevers or chills or swollen lumps or bumps.  Depression-she's interested in weaning off of the Cymbalta. She is feeling much better over the last month.  She is also on Welblutrin.    Review of Systems  BP 122/60 mmHg  Pulse 79  Wt 233 lb (105.688 kg)  SpO2 100%    Allergies  Allergen Reactions  . Gabapentin Other (See Comments)    dizziness  . Lisinopril Other (See Comments)    Dry cough  . Oxycodone Nausea And Vomiting    Past Medical History  Diagnosis Date  . Diabetes mellitus   . DVT (deep venous thrombosis) ([place])     s/p knee replacmet.   . Anxiety and depression   . Fibromyalgia   . Chronic fatigue syndrome   . Peripheral edema   . Hypercholesteremia   . Major depressive disorder ([place]) 03/20/2012  . Sinusitis July 2013    Past Surgical History  Procedure Laterality Date  . Abdominal  hysterectomy    . Replacement total knee    . Back surgery    . Rotator cuff repair    . Tonsillectomy      Social History   Social History  . Marital Status: Married    Spouse Name: N/A  . Number of Children: N/A  . Years of Education: N/A   Occupational History  . Not on file.   Social History Main Topics  . Smoking status: Never Smoker   . Smokeless tobacco: Not on file  . Alcohol Use: No     Comment: Last drink April 2013  . Drug Use: No     Comment: Caffeine: Coffee -2-3  cups per day.   Marland Kitchen Sexual Activity: Not Currently   Other Topics Concern  . Not on file   Social History Narrative    Family History  Problem Relation Age of Onset  . Hypertension Mother   . Atrial fibrillation Mother   . Diabetes Mother   . Hyperlipidemia Mother   . Cancer Father   . Diabetes type II Father   . Depression Daughter     Outpatient Encounter Prescriptions as of 02/05/2016  Medication Sig  . amLODipine (NORVASC) 5 MG tablet TAKE 2 TABLETS BY MOUTH EVERY DAY  . aspirin EC 81 MG tablet Take 81 mg by mouth daily.  Marland Kitchen atorvastatin (  LIPITOR) 20 MG tablet Take 1 tablet (20 mg total) by mouth daily.  . baclofen (LIORESAL) 10 MG tablet TAKE 1 TABLET BY MOUTH TWICE DAILY AS DIRECTED  . buPROPion (WELLBUTRIN XL) 150 MG 24 hr tablet TAKE 1 TABLET(150 MG) BY MOUTH TWICE DAILY AT 8 AM AND AT 3 PM  . DULoxetine (CYMBALTA) 60 MG capsule TAKE 1 CAPSULE BY MOUTH DAILY  . glucose blood (ACCU-CHEK SMARTVIEW) test strip Use to check blood sugars 2 times per day  . HYDROcodone-acetaminophen (NORCO/VICODIN) 5-325 MG tablet Take 1 tablet by mouth at bedtime as needed for moderate pain. PRN  . losartan-hydrochlorothiazide (HYZAAR) 100-25 MG tablet Take 1 tablet by mouth daily.  . metFORMIN (GLUCOPHAGE) 1000 MG tablet Take 1 tablet (1,000 mg total) by mouth 2 (two) times daily.  . [DISCONTINUED] amLODipine (NORVASC) 5 MG tablet TAKE 2 TABLETS BY MOUTH EVERY DAY   No facility-administered encounter  medications on file as of 02/05/2016.          Objective:   Physical Exam  Constitutional: She is oriented to person, place, and time. She appears well-developed and well-nourished.  HENT:  Head: Normocephalic and atraumatic.  Eyes: Conjunctivae and EOM are normal.  Cardiovascular: Normal rate.   Pulmonary/Chest: Effort normal.  Neurological: She is alert and oriented to person, place, and time.  Skin: Skin is dry. No pallor.  Psychiatric: She has a normal mood and affect. Her behavior is normal.  Vitals reviewed.       Assessment & Plan:  Depression - Strongly encouraged her to continue with her Cymbalta for a little longer. She is just now therapeutic with her regimen. Due 9 score of 6 today. 3 of her points her for appetite changes into her for fatigue. She actually denies any depressive symptoms currently. Explained to her that based on current literature guidelines recommend addition is for her to stay on medication for 6-12 months after becoming therapeutic on it. She is willing to do so. Follow-up in 3 months and can discuss again at that point in time. We also discussed that she may also have some element of seasonal affective disorder and might want to even consider light therapy. I discussed that studies show that this can actually be really helpful. Struggling with sleep.  Right otalgia - Her exam is completely benign. No sign of fluid or infection. May be related to elevated levels a she has had some increase in postnasal drip as well. Encouraged her to try to swallow or chew if he said, but she starts to feel a lot of pressure buildup. She can certainly consider an over-the-counter antihistamine if needed as well.  Dysphagia - Will refer to GI for further evaluation. Explained that she likely has an esophageal stricture based on her description. In the meantime we'll she's waiting for her appointment encourage her to very carefully chew her food intake or time to swallow. Also  make sure taking medications individually to avoid obstruction.  Fatigue-we'll go ahead and check additional blood work including thyroid, iron, B12 and CBC.

## 2016-02-08 LAB — CBC WITH DIFFERENTIAL/PLATELET
Basophils Absolute: 55 cells/uL (ref 0–200)
Basophils Relative: 1 %
Eosinophils Absolute: 110 cells/uL (ref 15–500)
Eosinophils Relative: 2 %
HCT: 32.7 % — ABNORMAL LOW (ref 35.0–45.0)
Hemoglobin: 10.8 g/dL — ABNORMAL LOW (ref 11.7–15.5)
Lymphocytes Relative: 24 %
Lymphs Abs: 1320 cells/uL (ref 850–3900)
MCH: 29.8 pg (ref 27.0–33.0)
MCHC: 33 g/dL (ref 32.0–36.0)
MCV: 90.1 fL (ref 80.0–100.0)
MPV: 10.1 fL (ref 7.5–12.5)
Monocytes Absolute: 330 cells/uL (ref 200–950)
Monocytes Relative: 6 %
Neutro Abs: 3685 cells/uL (ref 1500–7800)
Neutrophils Relative %: 67 %
Platelets: 210 10*3/uL (ref 140–400)
RBC: 3.63 MIL/uL — ABNORMAL LOW (ref 3.80–5.10)
RDW: 13.2 % (ref 11.0–15.0)
WBC: 5.5 10*3/uL (ref 3.8–10.8)

## 2016-02-09 ENCOUNTER — Other Ambulatory Visit: Payer: Self-pay | Admitting: Family Medicine

## 2016-02-09 LAB — VITAMIN B12: Vitamin B-12: 241 pg/mL (ref 200–1100)

## 2016-02-09 LAB — T3, FREE: T3, Free: 2.3 pg/mL (ref 2.3–4.2)

## 2016-02-09 LAB — BASIC METABOLIC PANEL
BUN: 31 mg/dL — ABNORMAL HIGH (ref 7–25)
CO2: 25 mmol/L (ref 20–31)
Calcium: 9.4 mg/dL (ref 8.6–10.4)
Chloride: 103 mmol/L (ref 98–110)
Creat: 1.05 mg/dL — ABNORMAL HIGH (ref 0.60–0.93)
Glucose, Bld: 114 mg/dL — ABNORMAL HIGH (ref 65–99)
Potassium: 4.1 mmol/L (ref 3.5–5.3)
Sodium: 139 mmol/L (ref 135–146)

## 2016-02-09 LAB — FOLATE: Folate: 20.4 ng/mL (ref >5.4)

## 2016-02-09 LAB — VITAMIN D 25 HYDROXY (VIT D DEFICIENCY, FRACTURES): Vit D, 25-Hydroxy: 30 ng/mL (ref 30–100)

## 2016-02-09 LAB — T4, FREE: Free T4: 1.2 ng/dL (ref 0.8–1.8)

## 2016-02-09 LAB — FERRITIN: Ferritin: 52 ng/mL (ref 20–288)

## 2016-02-09 LAB — TSH: TSH: 1.62 mIU/L

## 2016-02-10 ENCOUNTER — Encounter: Payer: Self-pay | Admitting: Gastroenterology

## 2016-02-11 ENCOUNTER — Other Ambulatory Visit: Payer: Self-pay | Admitting: *Deleted

## 2016-02-11 MED ORDER — BUPROPION HCL ER (XL) 150 MG PO TB24: ORAL_TABLET | ORAL | Status: DC

## 2016-03-01 ENCOUNTER — Ambulatory Visit: Payer: Self-pay | Admitting: Gastroenterology

## 2016-03-01 ENCOUNTER — Telehealth: Payer: Self-pay | Admitting: Gastroenterology

## 2016-03-01 NOTE — Telephone Encounter (Signed)
ok 

## 2016-03-01 NOTE — Telephone Encounter (Signed)
Dr Silverio Decamp- Just an [place].Marland KitchenMarland Kitchen

## 2016-03-09 ENCOUNTER — Other Ambulatory Visit: Payer: Self-pay | Admitting: Family Medicine

## 2016-03-10 ENCOUNTER — Inpatient Hospital Stay: Admission: RE | Admit: 2016-03-10 | Payer: Self-pay | Source: Ambulatory Visit

## 2016-03-14 ENCOUNTER — Other Ambulatory Visit: Payer: Self-pay | Admitting: Family Medicine

## 2016-03-21 ENCOUNTER — Other Ambulatory Visit: Payer: Self-pay | Admitting: *Deleted

## 2016-03-21 MED ORDER — LOSARTAN POTASSIUM-HCTZ 100-25 MG PO TABS: 1.0000 | ORAL_TABLET | Freq: Every day | ORAL | Status: DC

## 2016-03-21 MED ORDER — METFORMIN HCL 1000 MG PO TABS: 1000.0000 mg | ORAL_TABLET | Freq: Two times a day (BID) | ORAL | Status: DC

## 2016-03-21 MED ORDER — BUPROPION HCL ER (XL) 150 MG PO TB24: ORAL_TABLET | ORAL | Status: DC

## 2016-03-21 MED ORDER — DULOXETINE HCL 60 MG PO CPEP: 60.0000 mg | ORAL_CAPSULE | Freq: Every day | ORAL | Status: DC

## 2016-03-21 MED ORDER — ATORVASTATIN CALCIUM 20 MG PO TABS: 20.0000 mg | ORAL_TABLET | Freq: Every day | ORAL | Status: DC

## 2016-03-21 MED ORDER — AMLODIPINE BESYLATE 5 MG PO TABS: 10.0000 mg | ORAL_TABLET | Freq: Every day | ORAL | Status: DC

## 2016-03-21 MED ORDER — BACLOFEN 10 MG PO TABS: 10.0000 mg | ORAL_TABLET | Freq: Two times a day (BID) | ORAL | Status: DC

## 2016-03-28 ENCOUNTER — Encounter: Payer: Self-pay | Admitting: Gastroenterology

## 2016-04-01 ENCOUNTER — Ambulatory Visit: Payer: Self-pay | Admitting: Family Medicine

## 2016-04-07 ENCOUNTER — Other Ambulatory Visit: Payer: Self-pay | Admitting: Family Medicine

## 2016-04-07 ENCOUNTER — Other Ambulatory Visit: Payer: Self-pay | Admitting: *Deleted

## 2016-04-08 ENCOUNTER — Other Ambulatory Visit: Payer: Self-pay | Admitting: Family Medicine

## 2016-04-08 DIAGNOSIS — E2839 Other primary ovarian failure: Secondary | ICD-10-CM

## 2016-04-11 ENCOUNTER — Other Ambulatory Visit: Payer: Self-pay

## 2016-04-11 ENCOUNTER — Ambulatory Visit
Admission: RE | Admit: 2016-04-11 | Discharge: 2016-04-11 | Disposition: A | Discharge status: Home / Self Care | Payer: Medicare Other | Source: Ambulatory Visit | Attending: Family Medicine | Admitting: Family Medicine

## 2016-04-11 DIAGNOSIS — E2839 Other primary ovarian failure: Secondary | ICD-10-CM

## 2016-04-18 ENCOUNTER — Other Ambulatory Visit: Payer: Self-pay | Admitting: Family Medicine

## 2016-04-20 LAB — HM DIABETES EYE EXAM

## 2016-04-25 ENCOUNTER — Ambulatory Visit (INDEPENDENT_AMBULATORY_CARE_PROVIDER_SITE_OTHER): Payer: Medicare Other | Admitting: Family Medicine

## 2016-04-25 ENCOUNTER — Encounter: Payer: Self-pay | Admitting: Family Medicine

## 2016-04-25 VITALS — BP 126/55 | HR 79 | Wt 242.0 lb

## 2016-04-25 DIAGNOSIS — Z0000 Encounter for general adult medical examination without abnormal findings: Secondary | ICD-10-CM

## 2016-04-25 DIAGNOSIS — I10 Essential (primary) hypertension: Secondary | ICD-10-CM

## 2016-04-25 DIAGNOSIS — IMO0001 Reserved for inherently not codable concepts without codable children: Secondary | ICD-10-CM

## 2016-04-25 DIAGNOSIS — R7989 Other specified abnormal findings of blood chemistry: Secondary | ICD-10-CM

## 2016-04-25 DIAGNOSIS — R748 Abnormal levels of other serum enzymes: Secondary | ICD-10-CM

## 2016-04-25 NOTE — Addendum Note (Signed)
Addended by: Beatrice Lecher D on: 04/25/2016 02:56 PM   Modules accepted: Miquel Dunn

## 2016-04-25 NOTE — Patient Instructions (Signed)
Diabetes and Foot Care Diabetes may cause you to have problems because of poor blood supply (circulation) to your feet and legs. This may cause the skin on your feet to become thinner, break easier, and heal more slowly. Your skin may become dry, and the skin may peel and crack. You may also have nerve damage in your legs and feet causing decreased feeling in them. You may not notice minor injuries to your feet that could lead to infections or more serious problems. Taking care of your feet is one of the most important things you can do for yourself.  HOME CARE INSTRUCTIONS  Wear shoes at all times, even in the house. Do not go barefoot. Bare feet are easily injured.  Check your feet daily for blisters, cuts, and redness. If you cannot see the bottom of your feet, use a mirror or ask someone for help.  Wash your feet with warm water (do not use hot water) and mild soap. Then pat your feet and the areas between your toes until they are completely dry. Do not soak your feet as this can dry your skin.  Apply a moisturizing lotion or petroleum jelly (that does not contain alcohol and is unscented) to the skin on your feet and to dry, brittle toenails. Do not apply lotion between your toes.  Trim your toenails straight across. Do not dig under them or around the cuticle. File the edges of your nails with an emery board or nail file.  Do not cut corns or calluses or try to remove them with medicine.  Wear clean socks or stockings every day. Make sure they are not too tight. Do not wear knee-high stockings since they may decrease blood flow to your legs.  Wear shoes that fit properly and have enough cushioning. To break in new shoes, wear them for just a few hours a day. This prevents you from injuring your feet. Always look in your shoes before you put them on to be sure there are no objects inside.  Do not cross your legs. This may decrease the blood flow to your feet.  If you find a minor scrape,  cut, or break in the skin on your feet, keep it and the skin around it clean and dry. These areas may be cleansed with mild soap and water. Do not cleanse the area with peroxide, alcohol, or iodine.  When you remove an adhesive bandage, be sure not to damage the skin around it.  If you have a wound, look at it several times a day to make sure it is healing.  Do not use heating pads or hot water bottles. They may burn your skin. If you have lost feeling in your feet or legs, you may not know it is happening until it is too late.  Make sure your health care provider performs a complete foot exam at least annually or more often if you have foot problems. Report any cuts, sores, or bruises to your health care provider immediately. SEEK MEDICAL CARE IF:   You have an injury that is not healing.  You have cuts or breaks in the skin.  You have an ingrown nail.  You notice redness on your legs or feet.  You feel burning or tingling in your legs or feet.  You have pain or cramps in your legs and feet.  Your legs or feet are numb.  Your feet always feel cold. SEEK IMMEDIATE MEDICAL CARE IF:   There is increasing redness,   swelling, or pain in or around a wound.  There is a red line that goes up your leg.  Pus is coming from a wound.  You develop a fever or as directed by your health care provider.  You notice a bad smell coming from an ulcer or wound.   This information is not intended to replace advice given to you by your health care provider. Make sure you discuss any questions you have with your health care provider.   Document Released: 09/23/2000 Document Revised: 05/29/2013 Document Reviewed: 03/05/2013 Elsevier Interactive Patient Education 2016 Elsevier Inc.  

## 2016-04-25 NOTE — Progress Notes (Addendum)
Subjective:   Carolyn Lambert is a 75 y.o. female who presents for Medicare Annual (Subsequent) preventive examination.  Review of Systems:  Comprehensive ROS is negative.         Objective:     Vitals: BP 126/55 mmHg  Pulse 79  Wt 242 lb (109.77 kg)  SpO2 99%  Body mass index is 37.89 kg/(m^2).   Tobacco History  Smoking status  . Never Smoker   Smokeless tobacco  . Not on file     Counseling given: Not Answered   Past Medical History  Diagnosis Date  . Diabetes mellitus   . DVT (deep venous thrombosis) ([place])     s/p knee replacmet.   . Anxiety and depression   . Fibromyalgia   . Chronic fatigue syndrome   . Peripheral edema   . Hypercholesteremia   . Major depressive disorder ([place]) 03/20/2012  . Sinusitis July 2013   Past Surgical History  Procedure Laterality Date  . Abdominal hysterectomy    . Replacement total knee    . Back surgery    . Rotator cuff repair    . Tonsillectomy     Family History  Problem Relation Age of Onset  . Hypertension Mother   . Atrial fibrillation Mother   . Diabetes Mother   . Hyperlipidemia Mother   . Cancer Father   . Diabetes type II Father   . Depression Daughter    History  Sexual Activity  . Sexual Activity: Not Currently    Outpatient Encounter Prescriptions as of 04/25/2016  Medication Sig  . amLODipine (NORVASC) 5 MG tablet Take 2 tablets (10 mg total) by mouth daily.  Marland Kitchen aspirin EC 81 MG tablet Take 81 mg by mouth daily.  Marland Kitchen atorvastatin (LIPITOR) 20 MG tablet Take 1 tablet (20 mg total) by mouth daily.  . baclofen (LIORESAL) 10 MG tablet Take 1 tablet (10 mg total) by mouth 2 (two) times daily. as directed  . buPROPion (WELLBUTRIN XL) 150 MG 24 hr tablet TAKE 1 TABLET(150 MG) BY MOUTH TWICE DAILY AT 8 AM AND AT 3 PM  . DULoxetine (CYMBALTA) 60 MG capsule Take 1 capsule (60 mg total) by mouth daily.  . fluticasone (FLONASE) 50 MCG/ACT nasal spray Place into the nose.  Marland Kitchen glucose blood (ACCU-CHEK  SMARTVIEW) test strip Use to check blood sugars 2 times per day  . losartan-hydrochlorothiazide (HYZAAR) 100-25 MG tablet Take 1 tablet by mouth daily.  . metFORMIN (GLUCOPHAGE) 1000 MG tablet Take 1 tablet (1,000 mg total) by mouth 2 (two) times daily.  . [DISCONTINUED] HYDROcodone-acetaminophen (NORCO/VICODIN) 5-325 MG tablet Take 1 tablet by mouth at bedtime as needed for moderate pain. PRN   No facility-administered encounter medications on file as of 04/25/2016.    Activities of Daily Living In your present state of health, do you have any difficulty performing the following activities: 04/25/2016  Hearing? N  Vision? N  Difficulty concentrating or making decisions? N  Walking or climbing stairs? Y  Dressing or bathing? N  Doing errands, shopping? Y    Patient Care Team: Hali Marry, MD as PCP - General (Family Medicine)    Assessment:     Exercise Activities and Dietary recommendations    Goes to the [facility] about 3 days per week except recently while sick.    Goals    None     Fall Risk Fall Risk  04/25/2016 08/18/2015 05/06/2014 02/04/2014  Falls in the past year? Yes No Yes Yes  Number falls in past yr: 2 or more - 1 1  Injury with Fall? No - Yes Yes  Risk Factor Category  High Fall Risk - High Fall Risk High Fall Risk  Risk for fall due to : - Impaired balance/gait Impaired balance/gait Impaired balance/gait  Follow up Education provided;Falls prevention discussed - - -   Depression Screen PHQ 2/9 Scores 04/25/2016 04/25/2016 12/31/2015 08/18/2015  PHQ - 2 Score _0 PHQ- 9 Score _1 Cognitive Testing No flowsheet data found.  Immunization History  Administered Date(s) Administered  . Influenza,inj,Quad PF,36+ Mos 06/17/2014, 06/02/2015  . Influenza-Unspecified 08/10/2013  . Pneumococcal Conjugate-13 08/18/2015  . Pneumococcal Polysaccharide-23 08/02/2008  . Td 03/26/2008   Screening Tests Health Maintenance  Topic Date Due  .  OPHTHALMOLOGY EXAM  04/28/2016  . INFLUENZA VACCINE  05/10/2016  . HEMOGLOBIN A1C  07/02/2016  . FOOT EXAM  09/14/2016  . TETANUS/TDAP  03/26/2018  . COLONOSCOPY  07/11/2023  . DEXA SCAN  Completed  . ZOSTAVAX  Addressed  . PNA vac Low Risk Adult  Completed      Plan:  Medicare [place] Exam  During the course of the visit the patient was educated and counseled about the following appropriate screening and preventive services:   Vaccines UTD.  Zostavax declined.    Cardiovascular Disease  Colorectal cancer screening  Bone density screening - done.   Glaucoma screening  Mammography/PAP - due   Nutrition counseling - referral placed.   Balance Issues - recommend check at the [facility] where she is already member. She can also check into some videos for tai chi and balance.  Patient Instructions (the written plan) was given to the patient.   METHENEY,CATHERINE, MD  04/25/2016

## 2016-04-26 LAB — COMPLETE METABOLIC PANEL WITH GFR
ALT: 8 U/L (ref 6–29)
AST: 12 U/L (ref 10–35)
Albumin: 3.8 g/dL (ref 3.6–5.1)
Alkaline Phosphatase: 69 U/L (ref 33–130)
BUN: 22 mg/dL (ref 7–25)
CO2: 28 mmol/L (ref 20–31)
Calcium: 9.3 mg/dL (ref 8.6–10.4)
Chloride: 106 mmol/L (ref 98–110)
Creat: 1.3 mg/dL — ABNORMAL HIGH (ref 0.60–0.93)
GFR, Est African American: 46 mL/min — ABNORMAL LOW (ref >=60)
GFR, Est Non African American: 40 mL/min — ABNORMAL LOW (ref >=60)
Glucose, Bld: 149 mg/dL — ABNORMAL HIGH (ref 65–99)
Potassium: 4.6 mmol/L (ref 3.5–5.3)
Sodium: 144 mmol/L (ref 135–146)
Total Bilirubin: 0.5 mg/dL (ref 0.2–1.2)
Total Protein: 6.2 g/dL (ref 6.1–8.1)

## 2016-04-26 LAB — LIPID PANEL
Cholesterol: 146 mg/dL (ref 125–200)
HDL: 69 mg/dL (ref >=46)
LDL Cholesterol: 56 mg/dL (ref <130)
Total CHOL/HDL Ratio: 2.1 Ratio (ref <=5.0)
Triglycerides: 105 mg/dL (ref <150)
VLDL: 21 mg/dL (ref <30)

## 2016-04-27 NOTE — Addendum Note (Signed)
Addended by: Teddy Spike on: 04/27/2016 01:29 PM   Modules accepted: Orders, SmartSet

## 2016-05-06 ENCOUNTER — Encounter: Payer: Self-pay | Admitting: Family Medicine

## 2016-05-06 ENCOUNTER — Ambulatory Visit (INDEPENDENT_AMBULATORY_CARE_PROVIDER_SITE_OTHER): Payer: Medicare Other | Admitting: Family Medicine

## 2016-05-06 VITALS — BP 140/60 | HR 79 | Wt 243.0 lb

## 2016-05-06 DIAGNOSIS — E119 Type 2 diabetes mellitus without complications: Secondary | ICD-10-CM | POA: Diagnosis not present

## 2016-05-06 DIAGNOSIS — R05 Cough: Secondary | ICD-10-CM | POA: Diagnosis not present

## 2016-05-06 DIAGNOSIS — I10 Essential (primary) hypertension: Secondary | ICD-10-CM

## 2016-05-06 DIAGNOSIS — M79661 Pain in right lower leg: Secondary | ICD-10-CM

## 2016-05-06 DIAGNOSIS — R053 Chronic cough: Secondary | ICD-10-CM

## 2016-05-06 DIAGNOSIS — F332 Major depressive disorder, recurrent severe without psychotic features: Secondary | ICD-10-CM

## 2016-05-06 LAB — POCT GLYCOSYLATED HEMOGLOBIN (HGB A1C): Hemoglobin A1C: 6.1

## 2016-05-06 MED ORDER — HYDROCODONE-ACETAMINOPHEN 5-325 MG PO TABS: 1.0000 | ORAL_TABLET | Freq: Every evening | ORAL | 0 refills | Status: DC | PRN

## 2016-05-06 MED ORDER — METOPROLOL SUCCINATE ER 50 MG PO TB24: 50.0000 mg | ORAL_TABLET | Freq: Every day | ORAL | 1 refills | Status: DC

## 2016-05-06 NOTE — Patient Instructions (Signed)
Hold the losartan for 3 weeks. Can take the metoprolol for now in its place Recheck Blood pressure in 2 weeks.

## 2016-05-06 NOTE — Progress Notes (Signed)
Subjective:    CC:   HPI: Diabetes - no hypoglycemic events. No wounds or sores that are not healing well. No increased thirst or urination. Checking glucose at home. Taking medications as prescribed without any side effects.  Hypertension- Pt denies chest pain, SOB, dizziness, or heart palpitations.  Taking meds as directed w/o problems.  Denies medication side effects.  She actually stopped her losartan about 2 days ago because she was worried that it potentially could be causing cough. She spoke with her pharmacist and he said that that could be causing it. Though she also has chronic postnasal drip. She is also following up with GI believe next week.  MDD - Currently on Cymbalta 60 mg daily as well as Wellbutrin 150 mg daily.  He also complains of right calf pain. She says it's been sore for couple of days. She is worried because she has had a blood clot before on the left leg.  Past medical history, Surgical history, Family history not pertinant except as noted below, Social history, Allergies, and medications have been entered into the medical record, reviewed, and corrections made.   Review of Systems: No fevers, chills, night sweats, weight loss, chest pain, or shortness of breath.   Objective:    General: Well Developed, well nourished, and in no acute distress.  Neuro: Alert and oriented x3, extra-ocular muscles intact, sensation grossly intact.  HEENT: Normocephalic, atraumatic  Skin: Warm and dry, no rashes. Cardiac: Regular rate and rhythm, no murmurs rubs or gallops, no lower extremity edema.  Respiratory: Clear to auscultation bilaterally. Not using accessory muscles, speaking in full sentences. Ext: Mild tenderness over the right calf. No significant swelling or erythema or edema.   Impression and Recommendations:   DM- On aspirin, ARB, Lipitor. A1c is up a little bit from previous. Explained 1 today. We'll continue current regimen and continue to watch diet  HTN -  blood pressure elevated today but off of her medication for 2 days. Will assess to metoprolol in place of the ARB just to see if this could be causing her cough. I like to see her back in a month to see if the cough is improved and to make sure that her blood pressure is well controlled. Will start with metoprolol 50 mg.  MDD - well controlled. PHQ 9 score of 4 today. Continue current regimen. I will see her back in 3-4 months.  Cough-she wants to hold the losartan for. Time to see if that could be contributing. I think certainly we could try this. We'll do is probably put her on a beta blocker short-term with the idea that if the cough does not get better then we will go back to the losartan since it does provide some renal protection for her diabetes.  Right calf pain-we'll check a d-dimer level. I have a low suspicion at this point.

## 2016-05-09 LAB — D-DIMER, QUANTITATIVE: D-Dimer, Quant: 0.57 mcg/mL FEU — ABNORMAL HIGH (ref <0.50)

## 2016-05-10 NOTE — Addendum Note (Signed)
Addended by: Teddy Spike on: 05/10/2016 08:42 AM   Modules accepted: Orders

## 2016-05-12 ENCOUNTER — Ambulatory Visit: Payer: Self-pay | Admitting: Dietician

## 2016-05-12 ENCOUNTER — Encounter: Payer: Self-pay | Admitting: Gastroenterology

## 2016-05-12 ENCOUNTER — Ambulatory Visit (HOSPITAL_BASED_OUTPATIENT_CLINIC_OR_DEPARTMENT_OTHER)
Admission: RE | Admit: 2016-05-12 | Discharge: 2016-05-12 | Disposition: A | Discharge status: Home / Self Care | Payer: Medicare Other | Source: Ambulatory Visit | Attending: Family Medicine | Admitting: Family Medicine

## 2016-05-12 DIAGNOSIS — M79661 Pain in right lower leg: Secondary | ICD-10-CM | POA: Diagnosis not present

## 2016-05-15 ENCOUNTER — Other Ambulatory Visit: Payer: Self-pay | Admitting: Family Medicine

## 2016-05-31 ENCOUNTER — Ambulatory Visit: Payer: Self-pay | Admitting: Gastroenterology

## 2016-06-02 ENCOUNTER — Ambulatory Visit (INDEPENDENT_AMBULATORY_CARE_PROVIDER_SITE_OTHER): Payer: Medicare Other | Admitting: Gastroenterology

## 2016-06-02 ENCOUNTER — Encounter: Payer: Self-pay | Admitting: Gastroenterology

## 2016-06-02 ENCOUNTER — Encounter (INDEPENDENT_AMBULATORY_CARE_PROVIDER_SITE_OTHER): Payer: Self-pay

## 2016-06-02 VITALS — BP 140/70 | HR 84 | Ht 67.0 in | Wt 243.0 lb

## 2016-06-02 DIAGNOSIS — R1310 Dysphagia, unspecified: Secondary | ICD-10-CM

## 2016-06-02 NOTE — Progress Notes (Signed)
Carolyn Lambert    [id number]    10-Oct-1941  Primary Care Physician:METHENEY,CATHERINE, MD  Referring Physician: Hali Marry, MD [place], [place] [address]  Chief complaint: Dysphagia  HPI:  75 year old female here with complaints of progressive dysphagia for the past 6 months, she was regurgitating food initially when it started but is no longer doing that, she does feel that most of her food is getting hung up in her lower chest. When she lays down at night she can feel the fluid or sometimes food come up into her mouth. Denies any episodes of food impaction. She has no difficulty with liquids. Denies any nausea, abdominal pain, melena or bright red blood per rectum    Outpatient Encounter Prescriptions as of 06/02/2016  Medication Sig  . amLODipine (NORVASC) 5 MG tablet Take 2 tablets (10 mg total) by mouth daily.  Marland Kitchen aspirin EC 81 MG tablet Take 81 mg by mouth daily.  Marland Kitchen atorvastatin (LIPITOR) 20 MG tablet Take 1 tablet (20 mg total) by mouth daily.  . baclofen (LIORESAL) 10 MG tablet Take 1 tablet (10 mg total) by mouth 2 (two) times daily. as directed  . buPROPion (WELLBUTRIN XL) 150 MG 24 hr tablet TAKE 1 TABLET(150 MG) BY MOUTH TWICE DAILY AT 8 AM AND AT 3 PM  . DULoxetine (CYMBALTA) 60 MG capsule Take 1 capsule (60 mg total) by mouth daily.  Marland Kitchen glucose blood (ACCU-CHEK SMARTVIEW) test strip Use to check blood sugars 2 times per day  . HYDROcodone-acetaminophen (NORCO/VICODIN) 5-325 MG tablet Take 1 tablet by mouth at bedtime as needed for moderate pain. PRN  . metFORMIN (GLUCOPHAGE) 1000 MG tablet Take 1 tablet (1,000 mg total) by mouth 2 (two) times daily.  . metoprolol succinate (TOPROL-XL) 50 MG 24 hr tablet Take 1 tablet (50 mg total) by mouth daily.  . [DISCONTINUED] fluticasone (FLONASE) 50 MCG/ACT nasal spray Place into the nose.  . [DISCONTINUED] losartan-hydrochlorothiazide (HYZAAR) 100-25 MG tablet Take 1 tablet  by mouth daily. (Patient not taking: Reported on 05/06/2016)   No facility-administered encounter medications on file as of 06/02/2016.     Allergies as of 06/02/2016 - Review Complete 06/02/2016  Allergen Reaction Noted  . Gabapentin Other (See Comments) 08/14/2014  . Lisinopril Other (See Comments) 09/25/2014  . Oxycodone Nausea And Vomiting 12/29/2014    Past Medical History:  Diagnosis Date  . Anxiety and depression   . Arthritis   . Chronic fatigue syndrome   . Diabetes mellitus   . DVT (deep venous thrombosis) ([place])    s/p knee replacmet.   . Fibromyalgia   . GERD (gastroesophageal reflux disease)   . Hypercholesteremia   . Hypertension   . Major depressive disorder ([place]) 03/20/2012  . Obesity   . Peripheral edema   . Sinusitis July 2013    Past Surgical History:  Procedure Laterality Date  . ABDOMINAL HYSTERECTOMY    . BACK SURGERY    . REPLACEMENT TOTAL KNEE    . ROTATOR CUFF REPAIR    . TONSILLECTOMY      Family History  Problem Relation Age of Onset  . Hypertension Mother   . Atrial fibrillation Mother   . Diabetes Mother   . Hyperlipidemia Mother   . Cancer Father   . Diabetes type II Father   . Depression Daughter     Social History   Social History  . Marital status: Married  Spouse name: N/A  . Number of children: N/A  . Years of education: N/A   Occupational History  . Not on file.   Social History Main Topics  . Smoking status: Never Smoker  . Smokeless tobacco: Not on file  . Alcohol use No     Comment: Last drink April 2013  . Drug use: No     Comment: Caffeine: Coffee -2-3  cups per day.   Marland Kitchen Sexual activity: Not Currently   Other Topics Concern  . Not on file   Social History Narrative  . No narrative on file      Review of systems: Review of Systems  Constitutional: Negative for fever and chills.  HENT: Negative.   Eyes: Negative for blurred vision.  Respiratory: Negative for cough, shortness of breath and  wheezing.   Cardiovascular: Negative for chest pain and palpitations.  Gastrointestinal: as per HPI Genitourinary: Negative for dysuria, urgency, frequency and hematuria.  Musculoskeletal: Negative for myalgias, back pain and joint pain.  Skin: Negative for itching and rash.  Neurological: Negative for dizziness, tremors, focal weakness, seizures and loss of consciousness.  Endo/Heme/Allergies: Positive for seasonal allergies.  Psychiatric/Behavioral: Negative for depression, suicidal ideas and hallucinations.  All other systems reviewed and are negative.   Physical Exam: Vitals:   06/02/16 1341  BP: 140/70   Body mass index is 38.06 kg/m. Gen:      No acute distress HEENT:  EOMI, sclera anicteric Neck:     No masses; no thyromegaly Lungs:    Clear to auscultation bilaterally; normal respiratory effort CV:         Regular rate and rhythm; no murmurs Abd:      + bowel sounds; soft, non-tender; no palpable masses, no distension Ext:    B/l edema; adequate peripheral perfusion Skin:      Warm and dry; no rash Neuro: alert and oriented x 3 Psych: normal mood and affect  Data Reviewed:  Reviewed chart in epic   Assessment and Plan/Recommendations:  89 yr F with complaint of new onset dysphagia to solids Will schedule for egd for evaluation +/- dilation Patient is reluctant to start PPI, will re discuss based on egd findings If no evidence of mechanical stricture or diagnosis based on egd, will consider esophageal manometry to exclude esophageal dysmotility   Greater than 50% of the time used for counseling as well as treatment plan and follow-up. She had multiple questions which were answered to her satisfaction  K. Denzil Magnuson , MD [phone] Mon-Fri 8a-5p [phone] after 5p, weekends, holidays  CC: Hali Marry, *

## 2016-06-02 NOTE — Patient Instructions (Signed)
You have been scheduled for an endoscopy. Please follow written instructions given to you at your visit today. If you use inhalers (even only as needed), please bring them with you on the day of your procedure. Your physician has requested that you go to www.startemmi.com and enter the access code given to you at your visit today. This web site gives a general overview about your procedure. However, you should still follow specific instructions given to you by our office regarding your preparation for the procedure.  Follow up as needed

## 2016-06-03 ENCOUNTER — Encounter: Payer: Self-pay | Admitting: Family Medicine

## 2016-06-03 ENCOUNTER — Ambulatory Visit: Payer: Self-pay | Admitting: Family Medicine

## 2016-06-06 ENCOUNTER — Ambulatory Visit: Payer: Self-pay | Admitting: Family Medicine

## 2016-06-06 NOTE — Progress Notes (Deleted)
Subjective:    CC: HTN  HPI: Hypertension- Pt denies chest pain, SOB, dizziness, or heart palpitations.  Taking meds as directed w/o problems.  Denies medication side effects.       Past medical history, Surgical history, Family history not pertinant except as noted below, Social history, Allergies, and medications have been entered into the medical record, reviewed, and corrections made.   Review of Systems: No fevers, chills, night sweats, weight loss, chest pain, or shortness of breath.   Objective:    General: Well Developed, well nourished, and in no acute distress.  Neuro: Alert and oriented x3, extra-ocular muscles intact, sensation grossly intact.  HEENT: Normocephalic, atraumatic  Skin: Warm and dry, no rashes. Cardiac: Regular rate and rhythm, no murmurs rubs or gallops, no lower extremity edema.  Respiratory: Clear to auscultation bilaterally. Not using accessory muscles, speaking in full sentences.   Impression and Recommendations:    HTN -

## 2016-06-08 ENCOUNTER — Encounter: Payer: Self-pay | Admitting: Gastroenterology

## 2016-06-15 ENCOUNTER — Ambulatory Visit: Payer: Self-pay | Admitting: Family Medicine

## 2016-06-22 ENCOUNTER — Encounter: Payer: Self-pay | Admitting: Gastroenterology

## 2016-07-03 ENCOUNTER — Other Ambulatory Visit: Payer: Self-pay | Admitting: Family Medicine

## 2016-07-08 ENCOUNTER — Other Ambulatory Visit: Payer: Self-pay | Admitting: Family Medicine

## 2016-07-11 ENCOUNTER — Encounter: Payer: Self-pay | Admitting: Family Medicine

## 2016-07-11 ENCOUNTER — Ambulatory Visit (INDEPENDENT_AMBULATORY_CARE_PROVIDER_SITE_OTHER): Payer: Medicare Other | Admitting: Family Medicine

## 2016-07-11 VITALS — BP 136/78 | HR 62 | Wt 244.0 lb

## 2016-07-11 DIAGNOSIS — F332 Major depressive disorder, recurrent severe without psychotic features: Secondary | ICD-10-CM

## 2016-07-11 DIAGNOSIS — R053 Chronic cough: Secondary | ICD-10-CM

## 2016-07-11 DIAGNOSIS — R05 Cough: Secondary | ICD-10-CM | POA: Diagnosis not present

## 2016-07-11 DIAGNOSIS — I10 Essential (primary) hypertension: Secondary | ICD-10-CM | POA: Diagnosis not present

## 2016-07-11 DIAGNOSIS — Z23 Encounter for immunization: Secondary | ICD-10-CM | POA: Diagnosis not present

## 2016-07-11 MED ORDER — ATORVASTATIN CALCIUM 20 MG PO TABS: 20.0000 mg | ORAL_TABLET | Freq: Every day | ORAL | 3 refills | Status: DC

## 2016-07-11 MED ORDER — ZOLPIDEM TARTRATE 5 MG PO TABS: 5.0000 mg | ORAL_TABLET | Freq: Once | ORAL | 2 refills | Status: DC

## 2016-07-11 MED ORDER — METOPROLOL SUCCINATE ER 50 MG PO TB24: ORAL_TABLET | ORAL | 1 refills | Status: DC

## 2016-07-11 MED ORDER — METFORMIN HCL 1000 MG PO TABS: 1000.0000 mg | ORAL_TABLET | Freq: Two times a day (BID) | ORAL | 1 refills | Status: DC

## 2016-07-11 NOTE — Progress Notes (Signed)
Subjective:    CC:   HPI: Hypertension- Pt denies chest pain, SOB, dizziness, or heart palpitations.  Taking meds as directed w/o problems.  Denies medication side effects.  We held her losartan because she thought it was really causing a cough.She does feel like her cough is much better off losartan. She says now she does have a little mild cough from some postnasal drip from file allergies.  MDD - she does complain of little interest or pleasure doing things several days of the week and difficulty with sleep more than half the days. Her husbands son just died so things have been stressful.  She denies any thoughts of wanting to harm herself.  Insomnia - she knows her insurance would not cover Ambien which she would like to have a prescription to just take occasionally when she's had several nights in a row where she hasn't been able to sleep. She was on it before and did well without any problems or excess sedation.  Past medical history, Surgical history, Family history not pertinant except as noted below, Social history, Allergies, and medications have been entered into the medical record, reviewed, and corrections made.   Review of Systems: No fevers, chills, night sweats, weight loss, chest pain, or shortness of breath.   Objective:    General: Well Developed, well nourished, and in no acute distress.  Neuro: Alert and oriented x3, extra-ocular muscles intact, sensation grossly intact.  HEENT: Normocephalic, atraumatic  Skin: Warm and dry, no rashes. Cardiac: Regular rate and rhythm, no murmurs rubs or gallops, no lower extremity edema.  Respiratory: Clear to auscultation bilaterally. Not using accessory muscles, speaking in full sentences.   Impression and Recommendations:   Major depressive disorder-PHQ 9 score of 7 today. This is up a little bit from previous.Continue current regimen with Cymbalta and Wellbutrin for now and will continue to monitor symptoms.  Hypertension- Well  controlled. Continue current regimen. Follow up in  6  Mo.   Chronic cough - much improved off of losartan. Added losartan to her intolerance list.  Insomnia-Ambien refilled. She is aware her insurance will not pay for it.

## 2016-07-13 ENCOUNTER — Telehealth: Payer: Self-pay | Admitting: *Deleted

## 2016-07-13 NOTE — Telephone Encounter (Signed)
Prior auth initiated through covermymeds. Per progress note patient is aware that insurance will not pay for ambien but submitted PA anyway just to make sure. (KeyEncarnacion Slates) [id number]

## 2016-07-14 NOTE — Telephone Encounter (Signed)
ambien denied by insurance. Denial letter sent to scan

## 2016-07-22 ENCOUNTER — Encounter (HOSPITAL_BASED_OUTPATIENT_CLINIC_OR_DEPARTMENT_OTHER): Payer: Self-pay | Admitting: Emergency Medicine

## 2016-07-22 ENCOUNTER — Emergency Department (HOSPITAL_BASED_OUTPATIENT_CLINIC_OR_DEPARTMENT_OTHER)
Admission: EM | Admit: 2016-07-22 | Discharge: 2016-07-22 | Disposition: A | Discharge status: Home / Self Care | Payer: Medicare Other | Attending: Emergency Medicine | Admitting: Emergency Medicine

## 2016-07-22 DIAGNOSIS — Z7984 Long term (current) use of oral hypoglycemic drugs: Secondary | ICD-10-CM | POA: Diagnosis not present

## 2016-07-22 DIAGNOSIS — Z7982 Long term (current) use of aspirin: Secondary | ICD-10-CM | POA: Diagnosis not present

## 2016-07-22 DIAGNOSIS — E119 Type 2 diabetes mellitus without complications: Secondary | ICD-10-CM | POA: Diagnosis not present

## 2016-07-22 DIAGNOSIS — I10 Essential (primary) hypertension: Secondary | ICD-10-CM | POA: Diagnosis not present

## 2016-07-22 DIAGNOSIS — R197 Diarrhea, unspecified: Secondary | ICD-10-CM | POA: Diagnosis not present

## 2016-07-22 DIAGNOSIS — R112 Nausea with vomiting, unspecified: Secondary | ICD-10-CM | POA: Diagnosis present

## 2016-07-22 DIAGNOSIS — Z79899 Other long term (current) drug therapy: Secondary | ICD-10-CM | POA: Insufficient documentation

## 2016-07-22 LAB — CBC WITH DIFFERENTIAL/PLATELET
Basophils Absolute: 0 10*3/uL (ref 0.0–0.1)
Basophils Relative: 0 %
Eosinophils Absolute: 0.1 10*3/uL (ref 0.0–0.7)
Eosinophils Relative: 1 %
HCT: 35.8 % — ABNORMAL LOW (ref 36.0–46.0)
Hemoglobin: 11.5 g/dL — ABNORMAL LOW (ref 12.0–15.0)
Lymphocytes Relative: 18 %
Lymphs Abs: 1.9 10*3/uL (ref 0.7–4.0)
MCH: 29.6 pg (ref 26.0–34.0)
MCHC: 32.1 g/dL (ref 30.0–36.0)
MCV: 92.3 fL (ref 78.0–100.0)
Monocytes Absolute: 0.4 10*3/uL (ref 0.1–1.0)
Monocytes Relative: 4 %
Neutro Abs: 8.1 10*3/uL — ABNORMAL HIGH (ref 1.7–7.7)
Neutrophils Relative %: 77 %
Platelets: 208 10*3/uL (ref 150–400)
RBC: 3.88 MIL/uL (ref 3.87–5.11)
RDW: 13.4 % (ref 11.5–15.5)
WBC: 10.6 10*3/uL — ABNORMAL HIGH (ref 4.0–10.5)

## 2016-07-22 LAB — COMPREHENSIVE METABOLIC PANEL
ALT: 11 U/L — ABNORMAL LOW (ref 14–54)
AST: 14 U/L — ABNORMAL LOW (ref 15–41)
Albumin: 3.8 g/dL (ref 3.5–5.0)
Alkaline Phosphatase: 72 U/L (ref 38–126)
Anion gap: 6 (ref 5–15)
BUN: 23 mg/dL — ABNORMAL HIGH (ref 6–20)
CO2: 28 mmol/L (ref 22–32)
Calcium: 9.3 mg/dL (ref 8.9–10.3)
Chloride: 108 mmol/L (ref 101–111)
Creatinine, Ser: 1.1 mg/dL — ABNORMAL HIGH (ref 0.44–1.00)
GFR calc Af Amer: 55 mL/min — ABNORMAL LOW (ref >60)
GFR calc non Af Amer: 48 mL/min — ABNORMAL LOW (ref >60)
Glucose, Bld: 124 mg/dL — ABNORMAL HIGH (ref 65–99)
Potassium: 4.5 mmol/L (ref 3.5–5.1)
Sodium: 142 mmol/L (ref 135–145)
Total Bilirubin: 0.4 mg/dL (ref 0.3–1.2)
Total Protein: 6.8 g/dL (ref 6.5–8.1)

## 2016-07-22 LAB — LIPASE, BLOOD: Lipase: 27 U/L (ref 11–51)

## 2016-07-22 MED ORDER — SODIUM CHLORIDE 0.9 % IV BOLUS (SEPSIS)
1000.0000 mL | Freq: Once | INTRAVENOUS | Status: AC
  Administered 2016-07-22: 1000 mL via INTRAVENOUS

## 2016-07-22 MED ORDER — ONDANSETRON HCL 4 MG/2ML IJ SOLN
4.0000 mg | Freq: Once | INTRAMUSCULAR | Status: AC
  Administered 2016-07-22: 4 mg via INTRAVENOUS
  Filled 2016-07-22: qty 2

## 2016-07-22 MED ORDER — ONDANSETRON 4 MG PO TBDP: ORAL_TABLET | ORAL | 0 refills | Status: DC

## 2016-07-22 NOTE — ED Provider Notes (Signed)
[facility] Provider Note   CSN: [id number] Arrival date & time: 07/22/16  2133   By signing my name below, I, Macon Large, attest that this documentation has been prepared under the direction and in the presence of Deno Etienne, DO. Electronically Signed: Macon Large, ED Scribe. 07/22/16. 10:34 PM.  History   Chief Complaint Chief Complaint  Patient presents with  . Emesis    The history is provided by the patient. No language interpreter was used.  Emesis   This is a new problem. The current episode started less than 1 hour ago. The problem has not changed since onset.There has been no fever. Pertinent negatives include no abdominal pain, no arthralgias, no chills, no diarrhea, no fever, no headaches and no myalgias. Risk factors include ill contacts.    HPI Comments: Carolyn Lambert is a 75 y.o. female who presents to the Emergency Department complaining of moderate, multiple episodes of emesis onset just PTA with associated occasional dry cough. Pt reports recent sick contact with husband, who has similar symptoms that began 2 days ago.  She denies additional sick contact or traveling outside of the country. No recent changes from city [place]. She denies fever, abdominal pain, diarrhea.   Past Medical History:  Diagnosis Date  . Anxiety and depression   . Arthritis   . Chronic fatigue syndrome   . Diabetes mellitus   . DVT (deep venous thrombosis) ([place])    s/p knee replacmet.   . Fibromyalgia   . GERD (gastroesophageal reflux disease)   . Hypercholesteremia   . Hypertension   . Major depressive disorder 03/20/2012  . Obesity   . Peripheral edema   . Sinusitis July 2013    Patient Active Problem List   Diagnosis Date Noted  . Obesity, Class II, BMI 35-39.9, with comorbidity 04/25/2016  . Nocturnal hypoxemia 02/24/2015  . Insomnia 02/16/2015  . Essential hypertension 09/16/2014  . S/P knee replacement 08/25/2014  . Lumbar spondylosis  05/06/2014  . Dry eye syndrome 02/06/2014  . Cataract 02/06/2014  . Macular drusen 02/06/2014  . MDD (major depressive disorder), recurrent episode, severe ([place]) 01/15/2014  . Post traumatic stress disorder (PTSD) 01/15/2014  . Obesity (BMI 30-39.9) 11/09/2013  . Thyroid nodule 11/08/2013  . Left foot drop 10/14/2013  . DVT (deep venous thrombosis) ([place]) 10/11/2013  . 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 Multinodular goiter 08/02/2013  . DM (diabetes mellitus), type 2 ([place]) 04/19/2013  . Pure hypercholesterolemia 04/19/2013  . Major depressive disorder 03/20/2012    Past Surgical History:  Procedure Laterality Date  . ABDOMINAL HYSTERECTOMY    . BACK SURGERY     x 2  . REPLACEMENT TOTAL KNEE Bilateral    2 on left and 1 on right, Dr Wynelle Link  . ROTATOR CUFF REPAIR Right   . TONSILLECTOMY      OB History    No data available       Home Medications    Prior to Admission medications   Medication Sig Start Date End Date Taking? Authorizing Provider  amLODipine (NORVASC) 5 MG tablet Take 2 tablets (10 mg total) by mouth daily. 03/21/16   Hali Marry, MD  aspirin EC 81 MG tablet Take 81 mg by mouth daily.    Historical Provider, MD  atorvastatin (LIPITOR) 20 MG tablet Take 1 tablet (20 mg total) by mouth daily. 07/11/16   Hali Marry, MD  baclofen (LIORESAL) 10 MG tablet Take 1 tablet (  10 mg total) by mouth 2 (two) times daily. as directed 03/21/16   Hali Marry, MD  buPROPion (WELLBUTRIN XL) 150 MG 24 hr tablet TAKE 1 TABLET(150 MG) BY MOUTH TWICE DAILY AT 8 AM AND AT 3 PM 03/21/16   Hali Marry, MD  DULoxetine (CYMBALTA) 60 MG capsule TAKE 1 CAPSULE BY MOUTH  DAILY 07/08/16   Hali Marry, MD  glucose blood (ACCU-CHEK SMARTVIEW) test strip Use to check blood sugars 2 times per day 05/22/13   Elayne Snare, MD  HYDROcodone-acetaminophen (NORCO/VICODIN) 5-325 MG tablet  06/16/16   Historical Provider, MD  metFORMIN (GLUCOPHAGE)  1000 MG tablet Take 1 tablet (1,000 mg total) by mouth 2 (two) times daily. 07/11/16   Hali Marry, MD  metoprolol succinate (TOPROL-XL) 50 MG 24 hr tablet Take one tablet by mouth daily. 07/11/16   Hali Marry, MD  ondansetron (ZOFRAN ODT) 4 MG disintegrating tablet 4mg  ODT q4 hours prn nausea/vomit 07/22/16   Deno Etienne, DO  zolpidem (AMBIEN) 5 MG tablet Take 1-1.5 tablets (5-7.5 mg total) by mouth once. 07/11/16 07/11/16  Hali Marry, MD    Family History Family History  Problem Relation Age of Onset  . Hypertension Mother   . Atrial fibrillation Mother   . Diabetes Mother   . Hyperlipidemia Mother   . Cancer Father     lung  . Diabetes type II Father   . Aneurysm Father     abdominal  . Depression Daughter   . Colon cancer Neg Hx     Social History Social History  Substance Use Topics  . Smoking status: Never Smoker  . Smokeless tobacco: Never Used  . Alcohol use No     Comment: Last drink April 2013     Allergies   Losartan; Gabapentin; Lisinopril; and Oxycodone   Review of Systems Review of Systems  Constitutional: Negative for chills and fever.  HENT: Negative for congestion and rhinorrhea.   Eyes: Negative for redness and visual disturbance.  Respiratory: Negative for shortness of breath and wheezing.   Cardiovascular: Negative for chest pain and palpitations.  Gastrointestinal: Positive for vomiting. Negative for abdominal pain, diarrhea and nausea.  Genitourinary: Negative for dysuria and urgency.  Musculoskeletal: Negative for arthralgias and myalgias.  Skin: Negative for pallor and wound.  Neurological: Negative for dizziness and headaches.     Physical Exam Updated Vital Signs BP 150/59 (BP Location: Right Arm)   Pulse 64   Temp 98.5 F (36.9 C) (Oral)   Resp 18   Ht 5\' 7"  (1.702 m)   Wt 242 lb (109.8 kg)   SpO2 97%   BMI 37.90 kg/m   Physical Exam  Constitutional: She is oriented to person, place, and time. She  appears well-developed and well-nourished. No distress.  HENT:  Head: Normocephalic and atraumatic.  Eyes: EOM are normal. Pupils are equal, round, and reactive to light.  Neck: Normal range of motion. Neck supple.  Cardiovascular: Normal rate and regular rhythm.  Exam reveals no gallop and no friction rub.   No murmur heard. Pulmonary/Chest: Effort normal. She has no wheezes. She has no rales.  Abdominal: Soft. She exhibits no distension. There is tenderness.  Mild epigstric tenderness.   Musculoskeletal: She exhibits no edema or tenderness.  Neurological: She is alert and oriented to person, place, and time.  Skin: Skin is warm and dry. She is not diaphoretic.  Psychiatric: She has a normal mood and affect. Her behavior is normal.  Nursing  note and vitals reviewed.    ED Treatments / Results   DIAGNOSTIC STUDIES: Oxygen Saturation is 99% on RA, normal by my interpretation.    COORDINATION OF CARE: 9:41 PM Discussed treatment plan with pt at bedside which includes labs and Zofran and pt agreed to plan.  Labs (all labs ordered are listed, but only abnormal results are displayed) Labs Reviewed  CBC WITH DIFFERENTIAL/PLATELET - Abnormal; Notable for the following:       Result Value   WBC 10.6 (*)    Hemoglobin 11.5 (*)    HCT 35.8 (*)    Neutro Abs 8.1 (*)    All other components within normal limits  COMPREHENSIVE METABOLIC PANEL - Abnormal; Notable for the following:    Glucose, Bld 124 (*)    BUN 23 (*)    Creatinine, Ser 1.10 (*)    AST 14 (*)    ALT 11 (*)    GFR calc non Af Amer 48 (*)    GFR calc Af Amer 55 (*)    All other components within normal limits  LIPASE, BLOOD    EKG  EKG Interpretation None       Radiology No results found.  Procedures Procedures (including critical care time)  Medications Ordered in ED Medications  sodium chloride 0.9 % bolus 1,000 mL (0 mLs Intravenous Stopped 07/22/16 2244)  ondansetron (ZOFRAN) injection 4 mg (4 mg  Intravenous Given 07/22/16 2210)     Initial Impression / Assessment and Plan / ED Course  I have reviewed the triage vital signs and the nursing notes.  Pertinent labs & imaging results that were available during my care of the patient were reviewed by me and considered in my medical decision making (see chart for details).  Clinical Course    75 yo F with n/v/d.  Going on just since she got to the ED.  Husband just got over similar illness.  Given zofran and able to tolerate PO.  D/c home.   10:54 PM:  I have discussed the diagnosis/risks/treatment options with the patient and family and believe the pt to be eligible for discharge home to follow-up with PCP. We also discussed returning to the ED immediately if new or worsening sx occur. We discussed the sx which are most concerning (e.g., sudden worsening pain, fever, inability to tolerate by mouth) that necessitate immediate return. Medications administered to the patient during their visit and any new prescriptions provided to the patient are listed below.  Medications given during this visit Medications  sodium chloride 0.9 % bolus 1,000 mL (0 mLs Intravenous Stopped 07/22/16 2244)  ondansetron (ZOFRAN) injection 4 mg (4 mg Intravenous Given 07/22/16 2210)     The patient appears reasonably screen and/or stabilized for discharge and I doubt any other medical condition or other [facility] requiring further screening, evaluation, or treatment in the ED at this time prior to discharge.    Final Clinical Impressions(s) / ED Diagnoses   Final diagnoses:  Nausea vomiting and diarrhea    New Prescriptions New Prescriptions   ONDANSETRON (ZOFRAN ODT) 4 MG DISINTEGRATING TABLET    4mg  ODT q4 hours prn nausea/vomit     I personally performed the services described in this documentation, which was scribed in my presence. The recorded information has been reviewed and is accurate.     Deno Etienne, DO 07/22/16 2254

## 2016-07-22 NOTE — ED Triage Notes (Signed)
Pt vomiting in triage. Pt here with husband who has same symptoms.

## 2016-07-22 NOTE — ED Notes (Signed)
Pt vomited x1. MD notified.  

## 2016-07-23 ENCOUNTER — Encounter (HOSPITAL_BASED_OUTPATIENT_CLINIC_OR_DEPARTMENT_OTHER): Payer: Self-pay | Admitting: *Deleted

## 2016-07-23 ENCOUNTER — Emergency Department (HOSPITAL_BASED_OUTPATIENT_CLINIC_OR_DEPARTMENT_OTHER)
Admission: EM | Admit: 2016-07-23 | Discharge: 2016-07-23 | Disposition: A | Discharge status: Home / Self Care | Payer: Medicare Other | Attending: Emergency Medicine | Admitting: Emergency Medicine

## 2016-07-23 DIAGNOSIS — I10 Essential (primary) hypertension: Secondary | ICD-10-CM | POA: Diagnosis not present

## 2016-07-23 DIAGNOSIS — Z79899 Other long term (current) drug therapy: Secondary | ICD-10-CM | POA: Insufficient documentation

## 2016-07-23 DIAGNOSIS — Z7982 Long term (current) use of aspirin: Secondary | ICD-10-CM | POA: Insufficient documentation

## 2016-07-23 DIAGNOSIS — E119 Type 2 diabetes mellitus without complications: Secondary | ICD-10-CM | POA: Insufficient documentation

## 2016-07-23 DIAGNOSIS — R197 Diarrhea, unspecified: Secondary | ICD-10-CM | POA: Diagnosis not present

## 2016-07-23 DIAGNOSIS — Z7984 Long term (current) use of oral hypoglycemic drugs: Secondary | ICD-10-CM | POA: Insufficient documentation

## 2016-07-23 DIAGNOSIS — R112 Nausea with vomiting, unspecified: Secondary | ICD-10-CM | POA: Insufficient documentation

## 2016-07-23 DIAGNOSIS — R1084 Generalized abdominal pain: Secondary | ICD-10-CM | POA: Diagnosis present

## 2016-07-23 MED ORDER — LOPERAMIDE HCL 2 MG PO CAPS
2.0000 mg | ORAL_CAPSULE | ORAL | Status: DC | PRN
  Administered 2016-07-23: 2 mg via ORAL
  Filled 2016-07-23: qty 1

## 2016-07-23 MED ORDER — PROMETHAZINE HCL 25 MG RE SUPP: 25.0000 mg | Freq: Four times a day (QID) | RECTAL | 0 refills | Status: DC | PRN

## 2016-07-23 MED ORDER — ONDANSETRON HCL 4 MG/2ML IJ SOLN
4.0000 mg | Freq: Once | INTRAMUSCULAR | Status: AC
  Administered 2016-07-23: 4 mg via INTRAVENOUS
  Filled 2016-07-23: qty 2

## 2016-07-23 MED ORDER — SODIUM CHLORIDE 0.9 % IV BOLUS (SEPSIS)
1000.0000 mL | Freq: Once | INTRAVENOUS | Status: AC
  Administered 2016-07-23: 1000 mL via INTRAVENOUS

## 2016-07-23 MED ORDER — MORPHINE SULFATE (PF) 2 MG/ML IV SOLN
2.0000 mg | Freq: Once | INTRAVENOUS | Status: AC
  Administered 2016-07-23: 2 mg via INTRAVENOUS
  Filled 2016-07-23: qty 1

## 2016-07-23 NOTE — ED Triage Notes (Signed)
Pt seen here last p.m. Abd pain, N/V/D. Returns tonight because s/s have continued even with meds.

## 2016-07-23 NOTE — ED Provider Notes (Signed)
[facility] Provider Note   CSN: [id number] Arrival date & time: 07/23/16  2044  By signing my name below, I, Reola Mosher, attest that this documentation has been prepared under the direction and in the presence of Deno Etienne, DO. Electronically Signed: Reola Mosher, ED Scribe. 07/23/16. 9:16 PM.  History   Chief Complaint Chief Complaint  Patient presents with  . Abdominal Pain   The history is provided by the patient. No language interpreter was used.  Abdominal Pain   This is a new problem. The current episode started yesterday. The problem occurs constantly. The pain is associated with sick contacts. The pain is located in the generalized abdominal region. The pain is moderate. Pertinent negatives include fever, nausea, vomiting, dysuria, headaches, arthralgias and myalgias. Nothing aggravates the symptoms. Nothing relieves the symptoms. Past workup does not include GI consult or surgery. Her past medical history is significant for GERD. Her past medical history does not include Crohn's disease.   HPI Comments: Carolyn Lambert is a 75 y.o. female with a PMHx of DM, obesity, and GERD, who presents to the Emergency Department complaining of gradually worsening generalized abdominal pain onset ~1 day ago. Pt reports associated nausea, vomiting, and diarrhea secondary to the onset of her abdominal pain. Pt was seen in the ED for same ~1 day ago and at the time she was rx'd Zofran and Imodium which she has been taking with minimal relief of her symptoms. Prior to the onset of her abdominal pain her husband was sick with similar symptoms. No recent travel outside of the country. No other associated symptoms or complaints at this time.   Past Medical History:  Diagnosis Date  . Anxiety and depression   . Arthritis   . Chronic fatigue syndrome   . Diabetes mellitus   . DVT (deep venous thrombosis) ([place])    s/p knee replacmet.   . Fibromyalgia   . GERD  (gastroesophageal reflux disease)   . Hypercholesteremia   . Hypertension   . Major depressive disorder 03/20/2012  . Obesity   . Peripheral edema   . Sinusitis July 2013   Patient Active Problem List   Diagnosis Date Noted  . Obesity, Class II, BMI 35-39.9, with comorbidity 04/25/2016  . Nocturnal hypoxemia 02/24/2015  . Insomnia 02/16/2015  . Essential hypertension 09/16/2014  . S/P knee replacement 08/25/2014  . Lumbar spondylosis 05/06/2014  . Dry eye syndrome 02/06/2014  . Cataract 02/06/2014  . Macular drusen 02/06/2014  . MDD (major depressive disorder), recurrent episode, severe ([place]) 01/15/2014  . Post traumatic stress disorder (PTSD) 01/15/2014  . Obesity (BMI 30-39.9) 11/09/2013  . Thyroid nodule 11/08/2013  . Left foot drop 10/14/2013  . DVT (deep venous thrombosis) ([place]) 10/11/2013  . 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 Multinodular goiter 08/02/2013  . DM (diabetes mellitus), type 2 ([place]) 04/19/2013  . Pure hypercholesterolemia 04/19/2013  . Major depressive disorder 03/20/2012   Past Surgical History:  Procedure Laterality Date  . ABDOMINAL HYSTERECTOMY    . BACK SURGERY     x 2  . REPLACEMENT TOTAL KNEE Bilateral    2 on left and 1 on right, Dr Wynelle Link  . ROTATOR CUFF REPAIR Right   . TONSILLECTOMY     OB History    No data available     Home Medications    Prior to Admission medications   Medication Sig Start Date End Date Taking? Authorizing Provider  amLODipine (NORVASC) 5 MG  tablet Take 2 tablets (10 mg total) by mouth daily. 03/21/16   Hali Marry, MD  aspirin EC 81 MG tablet Take 81 mg by mouth daily.    Historical Provider, MD  atorvastatin (LIPITOR) 20 MG tablet Take 1 tablet (20 mg total) by mouth daily. 07/11/16   Hali Marry, MD  baclofen (LIORESAL) 10 MG tablet Take 1 tablet (10 mg total) by mouth 2 (two) times daily. as directed 03/21/16   Hali Marry, MD  buPROPion (WELLBUTRIN XL) 150 MG 24 hr  tablet TAKE 1 TABLET(150 MG) BY MOUTH TWICE DAILY AT 8 AM AND AT 3 PM 03/21/16   Hali Marry, MD  DULoxetine (CYMBALTA) 60 MG capsule TAKE 1 CAPSULE BY MOUTH  DAILY 07/08/16   Hali Marry, MD  glucose blood (ACCU-CHEK SMARTVIEW) test strip Use to check blood sugars 2 times per day 05/22/13   Elayne Snare, MD  HYDROcodone-acetaminophen (NORCO/VICODIN) 5-325 MG tablet  06/16/16   Historical Provider, MD  metFORMIN (GLUCOPHAGE) 1000 MG tablet Take 1 tablet (1,000 mg total) by mouth 2 (two) times daily. 07/11/16   Hali Marry, MD  metoprolol succinate (TOPROL-XL) 50 MG 24 hr tablet Take one tablet by mouth daily. 07/11/16   Hali Marry, MD  ondansetron (ZOFRAN ODT) 4 MG disintegrating tablet 4mg  ODT q4 hours prn nausea/vomit 07/22/16   Deno Etienne, DO  promethazine (PHENERGAN) 25 MG suppository Place 1 suppository (25 mg total) rectally every 6 (six) hours as needed for nausea or vomiting. 07/23/16   Deno Etienne, DO  zolpidem (AMBIEN) 5 MG tablet Take 1-1.5 tablets (5-7.5 mg total) by mouth once. 07/11/16 07/11/16  Hali Marry, MD   Family History Family History  Problem Relation Age of Onset  . Hypertension Mother   . Atrial fibrillation Mother   . Diabetes Mother   . Hyperlipidemia Mother   . Cancer Father     lung  . Diabetes type II Father   . Aneurysm Father     abdominal  . Depression Daughter   . Colon cancer Neg Hx    Social History Social History  Substance Use Topics  . Smoking status: Never Smoker  . Smokeless tobacco: Never Used  . Alcohol use No     Comment: Last drink April 2013   Allergies   Losartan; Gabapentin; Lisinopril; and Oxycodone  Review of Systems Review of Systems  Constitutional: Negative for chills and fever.  HENT: Negative for congestion and rhinorrhea.   Eyes: Negative for redness and visual disturbance.  Respiratory: Negative for shortness of breath and wheezing.   Cardiovascular: Negative for chest pain and  palpitations.  Gastrointestinal: Positive for abdominal pain (generalized). Negative for nausea and vomiting.  Genitourinary: Negative for dysuria and urgency.  Musculoskeletal: Negative for arthralgias and myalgias.  Skin: Negative for pallor and wound.  Neurological: Negative for dizziness and headaches.  All other systems reviewed and are negative.  Physical Exam Updated Vital Signs BP 172/73 (BP Location: Right Arm)   Pulse 82   Temp 98.6 F (37 C) (Oral)   Resp 20   SpO2 100%   Physical Exam  Constitutional: She is oriented to person, place, and time. She appears well-developed and well-nourished. No distress.  HENT:  Head: Normocephalic and atraumatic.  Eyes: EOM are normal. Pupils are equal, round, and reactive to light.  Neck: Normal range of motion. Neck supple.  Cardiovascular: Normal rate and regular rhythm.  Exam reveals no gallop and no friction rub.  No murmur heard. Pulmonary/Chest: Effort normal. She has no wheezes. She has no rales.  Abdominal: Soft. She exhibits no distension. There is no tenderness.  Musculoskeletal: She exhibits no edema or tenderness.  Neurological: She is alert and oriented to person, place, and time.  Skin: Skin is warm and dry. She is not diaphoretic.  Psychiatric: She has a normal mood and affect. Her behavior is normal.  Nursing note and vitals reviewed.  ED Treatments / Results  DIAGNOSTIC STUDIES: Oxygen Saturation is 100% on RA, normal by my interpretation.   COORDINATION OF CARE: 9:15 PM-Discussed next steps with pt. Pt verbalized understanding and is agreeable with the plan.   Labs (all labs ordered are listed, but only abnormal results are displayed) Labs Reviewed - No data to display  EKG  EKG Interpretation None      Radiology No results found.  Procedures Procedures (including critical care time)  Medications Ordered in ED Medications  loperamide (IMODIUM) capsule 2 mg (2 mg Oral Given 07/23/16 2233)    sodium chloride 0.9 % bolus 1,000 mL (1,000 mLs Intravenous New Bag/Given 07/23/16 2115)  ondansetron (ZOFRAN) injection 4 mg (4 mg Intravenous Given 07/23/16 2113)  morphine 2 MG/ML injection 2 mg (2 mg Intravenous Given 07/23/16 2233)  ondansetron (ZOFRAN) injection 4 mg (4 mg Intravenous Given 07/23/16 2233)   Initial Impression / Assessment and Plan / ED Course  I have reviewed the triage vital signs and the nursing notes.  Pertinent labs & imaging results that were available during my care of the patient were reviewed by me and considered in my medical decision making (see chart for details).  Clinical Course   75 yo F With a chief complaint of nausea vomiting and diarrhea. Patient was seen yesterday for same. This was likely passed to her by her husband and therefore is most likely viral in nature. Patient's nausea and vomiting was controlled again in the ED with Zofran and morphine. She is requesting discharge home. Able to tolerate by mouth in the ED.   10:47 PM:  I have discussed the diagnosis/risks/treatment options with the patient and family and believe the pt to be eligible for discharge home to follow-up with PCP. We also discussed returning to the ED immediately if new or worsening sx occur. We discussed the sx which are most concerning (e.g., sudden worsening pain, fever, inability to tolerate by mouth) that necessitate immediate return. Medications administered to the patient during their visit and any new prescriptions provided to the patient are listed below.  Medications given during this visit Medications  loperamide (IMODIUM) capsule 2 mg (2 mg Oral Given 07/23/16 2233)  sodium chloride 0.9 % bolus 1,000 mL (1,000 mLs Intravenous New Bag/Given 07/23/16 2115)  ondansetron [facility]) injection 4 mg (4 mg Intravenous Given 07/23/16 2113)  morphine 2 MG/ML injection 2 mg (2 mg Intravenous Given 07/23/16 2233)  ondansetron (ZOFRAN) injection 4 mg (4 mg Intravenous Given  07/23/16 2233)     The patient appears reasonably screen and/or stabilized for discharge and I doubt any other medical condition or other [facility] requiring further screening, evaluation, or treatment in the ED at this time prior to discharge.    Final Clinical Impressions(s) / ED Diagnoses   Final diagnoses:  Nausea vomiting and diarrhea   New Prescriptions New Prescriptions   PROMETHAZINE (PHENERGAN) 25 MG SUPPOSITORY    Place 1 suppository (25 mg total) rectally every 6 (six) hours as needed for nausea or vomiting.   I personally performed the  services described in this documentation, which was scribed in my presence. The recorded information has been reviewed and is accurate.     Deno Etienne, DO 07/23/16 2247

## 2016-07-23 NOTE — ED Notes (Signed)
Pt given d/c instructions as per chart. Verbalizes understanding. No questions. Rx x 1 

## 2016-08-08 ENCOUNTER — Encounter: Payer: Self-pay | Admitting: Family Medicine

## 2016-08-08 ENCOUNTER — Ambulatory Visit (INDEPENDENT_AMBULATORY_CARE_PROVIDER_SITE_OTHER): Payer: Medicare Other | Admitting: Family Medicine

## 2016-08-08 VITALS — BP 165/72 | HR 74 | Wt 243.0 lb

## 2016-08-08 DIAGNOSIS — F332 Major depressive disorder, recurrent severe without psychotic features: Secondary | ICD-10-CM

## 2016-08-08 DIAGNOSIS — E119 Type 2 diabetes mellitus without complications: Secondary | ICD-10-CM | POA: Diagnosis not present

## 2016-08-08 DIAGNOSIS — I10 Essential (primary) hypertension: Secondary | ICD-10-CM | POA: Diagnosis not present

## 2016-08-08 LAB — POCT UA - MICROALBUMIN
Creatinine, POC: 100 mg/dL
Microalbumin Ur, POC: 80 mg/L

## 2016-08-08 NOTE — Progress Notes (Addendum)
Subjective:    CC:   HPI: Hypertension- Pt denies chest pain, SOB, dizziness, or heart palpitations.  Taking meds as directed w/o problems.  Denies medication side effects.    Major depressive disorder-doing very well on Cymbalta. She actually feels like her mood has been in a good place overall.She does complain of little interest or pleasure doing things more than half the days and feeling down more than half the days. Also feels like she over eats and feels bad about herself several days of the week.  She did have an episode of gastritis since I last saw her and went to the emergency department. She and her husband both had it. She says she is finally feeling much better.  Past medical history, Surgical history, Family history not pertinant except as noted below, Social history, Allergies, and medications have been entered into the medical record, reviewed, and corrections made.   Review of Systems: No fevers, chills, night sweats, weight loss, chest pain, or shortness of breath.   Objective:    General: Well Developed, well nourished, and in no acute distress.  Neuro: Alert and oriented x3, extra-ocular muscles intact, sensation grossly intact.  HEENT: Normocephalic, atraumatic  Skin: Warm and dry, no rashes. Cardiac: Regular rate and rhythm, no murmurs rubs or gallops, no lower extremity edema.  Respiratory: Clear to auscultation bilaterally. Not using accessory muscles, speaking in full sentences.   Impression and Recommendations:   MDD - PHQ- 9 score of 10 today. Up a little from previous. Into new current regimen. Follow-up in 3 months.  HTN - uncontrolled. Today, though was at goal 1 mo ago. Will monitor.   DM- urine micro performed today.

## 2016-09-20 ENCOUNTER — Encounter: Payer: Self-pay | Admitting: Family Medicine

## 2016-09-20 ENCOUNTER — Other Ambulatory Visit: Payer: Self-pay | Admitting: Family Medicine

## 2016-09-20 ENCOUNTER — Ambulatory Visit (INDEPENDENT_AMBULATORY_CARE_PROVIDER_SITE_OTHER): Payer: Medicare Other

## 2016-09-20 ENCOUNTER — Ambulatory Visit (INDEPENDENT_AMBULATORY_CARE_PROVIDER_SITE_OTHER): Payer: Medicare Other | Admitting: Family Medicine

## 2016-09-20 VITALS — BP 154/64 | HR 66 | Wt 247.0 lb

## 2016-09-20 DIAGNOSIS — R51 Headache: Principal | ICD-10-CM

## 2016-09-20 DIAGNOSIS — R519 Headache, unspecified: Secondary | ICD-10-CM

## 2016-09-20 DIAGNOSIS — S0990XA Unspecified injury of head, initial encounter: Secondary | ICD-10-CM | POA: Diagnosis not present

## 2016-09-20 DIAGNOSIS — W19XXXA Unspecified fall, initial encounter: Secondary | ICD-10-CM | POA: Diagnosis not present

## 2016-09-20 DIAGNOSIS — Z9181 History of falling: Secondary | ICD-10-CM

## 2016-09-20 DIAGNOSIS — G44319 Acute post-traumatic headache, not intractable: Secondary | ICD-10-CM

## 2016-09-20 NOTE — Progress Notes (Signed)
Carolyn Lambert is a 75 y.o. female who presents to [facility] today for headache. Patient fell 2 weeks ago hitting her left face. She denies any loss of consciousness but notes persistent headache. She does note her balance is a little worse than usual as well. Otherwise she feels quite well. Headache symptoms are mild to moderate. She's tried some over-the-counter medications which have helped a bit. She's well however she is concerned that her headaches systems are persistent. She notes she is scheduled to have cataract surgery next week and is worried about the head injury and persistent headaches around having cataract surgery. Her husband denies any change in personality or obvious confusion. Overall he thinks she is about the same.   Past Medical History:  Diagnosis Date  . Anxiety and depression   . Arthritis   . Chronic fatigue syndrome   . Diabetes mellitus   . DVT (deep venous thrombosis) ([place])    s/p knee replacmet.   . Fibromyalgia   . GERD (gastroesophageal reflux disease)   . Hypercholesteremia   . Hypertension   . Major depressive disorder 03/20/2012  . Obesity   . Peripheral edema   . Sinusitis July 2013   Past Surgical History:  Procedure Laterality Date  . ABDOMINAL HYSTERECTOMY    . BACK SURGERY     x 2  . REPLACEMENT TOTAL KNEE Bilateral    2 on left and 1 on right, Dr Wynelle Link  . ROTATOR CUFF REPAIR Right   . TONSILLECTOMY     Social History  Substance Use Topics  . Smoking status: Never Smoker  . Smokeless tobacco: Never Used  . Alcohol use No     Comment: Last drink April 2013   family history includes Aneurysm in her father; Atrial fibrillation in her mother; Cancer in her father; Depression in her daughter; Diabetes in her mother; Diabetes type II in her father; Hyperlipidemia in her mother; Hypertension in her mother.  ROS  as above:  Medications: Current Outpatient Prescriptions  Medication Sig Dispense Refill  . amLODipine (NORVASC) 5 MG tablet TAKE 2 TABLETS BY MOUTH  DAILY 180 tablet 1  . aspirin EC 81 MG tablet Take 81 mg by mouth daily.    Marland Kitchen atorvastatin (LIPITOR) 20 MG tablet Take 1 tablet (20 mg total) by mouth daily. 90 tablet 3  . baclofen (LIORESAL) 10 MG tablet Take 1 tablet (10 mg total) by mouth 2 (two) times daily. as directed 180 tablet 1  . buPROPion (WELLBUTRIN XL) 150 MG 24 hr tablet TAKE 1 TABLET(150 MG) BY MOUTH TWICE DAILY AT 8 AM AND AT 3 PM 180 tablet 3  . DULoxetine (CYMBALTA) 60 MG capsule TAKE 1 CAPSULE BY MOUTH  DAILY 90 capsule 1  . glucose blood (ACCU-CHEK SMARTVIEW) test strip Use to check blood sugars 2 times per day 200 each 3  . metFORMIN (GLUCOPHAGE) 1000 MG tablet Take 1 tablet (1,000 mg total) by mouth 2 (two) times daily. 180 tablet 1  . metoprolol succinate (TOPROL-XL) 50 MG 24 hr tablet Take one tablet by mouth daily. 90 tablet 1  . ondansetron (ZOFRAN ODT) 4 MG disintegrating tablet 4mg  ODT q4 hours prn nausea/vomit 20 tablet 0  . promethazine (PHENERGAN) 25 MG suppository Place 1 suppository (25 mg total) rectally every 6 (six) hours as needed for nausea or vomiting. 12 each 0  . zolpidem (AMBIEN) 5 MG tablet Take 1-1.5 tablets (5-7.5 mg total)  by mouth once. 45 tablet 2   No current facility-administered medications for this visit.    Allergies  Allergen Reactions  . Losartan Other (See Comments)    Cough   . Gabapentin Other (See Comments)    dizziness  . Lisinopril Other (See Comments)    Dry cough  . Oxycodone Nausea And Vomiting    Health Maintenance Health Maintenance  Topic Date Due  . FOOT EXAM  09/14/2016  . HEMOGLOBIN A1C  11/06/2016  . OPHTHALMOLOGY EXAM  04/20/2017  . URINE MICROALBUMIN  08/08/2017  . TETANUS/TDAP  03/26/2018  . COLONOSCOPY  07/11/2023  . INFLUENZA VACCINE  Addressed  . DEXA SCAN  Completed  . ZOSTAVAX  Addressed  . PNA vac  Low Risk Adult  Completed     Exam:  BP (!) 154/64   Pulse 66   Wt 247 lb (112 kg)   BMI 38.69 kg/m  Gen: Well NAD HEENT: EOMI,  MMM No facial bruising or deformities present. Nontender. No crepitation on exam Lungs: Normal work of breathing. CTABL Heart: RRR no MRG Abd: NABS, Soft. Nondistended, Nontender Exts: Brisk capillary refill, warm and well perfused.  Neuro: Alert and oriented normal coordination sensation motion strength reflexes and sensation. Balance slightly impaired. Patient uses a cane to ambulate at baseline.    No results found for this or any previous visit (from the past 72 hour(s)). Ct Head Wo Contrast  Result Date: 09/20/2016 CLINICAL DATA:  75 year old female with frequent falls, and persistent headaches for 2 weeks after fall. Initial encounter. EXAM: CT HEAD WITHOUT CONTRAST TECHNIQUE: Contiguous axial images were obtained from the base of the skull through the vertex without intravenous contrast. COMPARISON:  Cervical spine CT 05/20/2014. FINDINGS: Brain: Patchy bilateral cerebral white matter hypodensity. No cortical encephalomalacia identified. Cerebral volume is within normal limits for age. No midline shift, ventriculomegaly, mass effect, evidence of mass lesion, intracranial hemorrhage or evidence of cortically based acute infarction. Gray-white matter differentiation is within normal limits throughout the brain. Vascular: Mild Calcified atherosclerosis at the skull base. No suspicious intracranial vascular hyperdensity. Skull: No osseous abnormality identified. Sinuses/Orbits: Visualized paranasal sinuses and mastoids are stable and well pneumatized. Other: Visualized orbit soft tissues are within normal limits. No scalp hematoma identified. Negative scalp soft tissues. IMPRESSION: 1. No acute intracranial abnormality or acute traumatic injury identified. 2. Moderate nonspecific cerebral white matter changes, most commonly due to chronic small vessel disease.  Electronically Signed   By: Genevie Ann M.D.   On: 09/20/2016 14:30      Assessment and Plan: 75 y.o. female with fall with head injury with persistent mild headache.  Doubtful for serious process. Ecchymosis is probably a mild concussion. Plan for watchful waiting and treatment with Tylenol for pain control. Follow-up with PCP in the near future as needed.   Orders Placed This Encounter  Procedures  . CT Head Wo Contrast    Standing Status:   Future    Number of Occurrences:   1    Standing Expiration Date:   12/19/2017    Order Specific Question:   Reason for Exam (SYMPTOM  OR DIAGNOSIS REQUIRED)    Answer:   hit head 2 week ago. Persistant headache    Order Specific Question:   Preferred imaging location?    Answer:   [facility]    Discussed warning signs or symptoms. Please see discharge instructions. Patient expresses understanding.  CC: METHENEY,CATHERINE, MD

## 2016-09-20 NOTE — Patient Instructions (Signed)
Thank you for coming in today. Take it easy as possible. Use Tylenol up to every 6 hours as needed for pain. Follow-up with primary care provider. Go to the emergency room if your headache becomes excruciating or you have weakness or numbness or uncontrolled vomiting.    Concussion, Adult A concussion is a brain injury. It is caused by:  A hit to the head.  A quick and sudden movement (jolt) of the head or neck. A concussion is usually not life threatening. Even so, it can cause serious problems. If you had a concussion before, you may have concussion-like problems after a hit to your head. Follow these instructions at home: General instructions  Follow your doctor's directions carefully.  Take medicines only as told by your doctor.  Only take medicines your doctor says are safe.  Do not drink alcohol until your doctor says it is okay. Alcohol and some drugs can slow down healing. They can also put you at risk for further injury.  If you are having trouble remembering things, write them down.  Try to do one thing at a time if you get distracted easily. For example, do not watch TV while making dinner.  Talk to your family members or close friends when making important decisions.  Follow up with your doctor as told.  Watch your symptoms. Tell others to do the same. Serious problems can sometimes happen after a concussion. Older adults are more likely to have these problems.  Tell your teachers, school nurse, school counselor, coach, Product/process development scientist, or work Freight forwarder about your concussion. Tell them about what you can or cannot do. They should watch to see if:  It gets even harder for you to pay attention or concentrate.  It gets even harder for you to remember things or learn new things.  You need more time than normal to finish things.  You become annoyed (irritable) more than before.  You are not able to deal with stress as well.  You have more problems than  before.  Rest. Make sure you:  Get plenty of sleep at night.  Go to sleep early.  Go to bed at the same time every day. Try to wake up at the same time.  Rest during the day.  Take naps when you feel tired.  Limit activities where you have to think a lot or concentrate. These include:  Doing homework.  Doing work related to a job.  Watching TV.  Using the computer. Returning To Your Regular Activities  Return to your normal activities slowly, not all at once. You must give your body and brain enough time to heal.  Do not play sports or do other athletic activities until your doctor says it is okay.  Ask your doctor when you can drive, ride a bicycle, or work other vehicles or machines. Never do these things if you feel dizzy.  Ask your doctor about when you can return to work or school. Preventing Another Concussion  It is very important to avoid another brain injury, especially before you have healed. In rare cases, another injury can lead to permanent brain damage, brain swelling, or death. The risk of this is greatest during the first 7-10 days after your injury. Avoid injuries by:  Wearing a seat belt when riding in a car.  Not drinking too much alcohol.  Avoiding activities that could lead to a second concussion (such as contact sports).  Wearing a helmet when doing activities like:  Biking.  Skiing.  Skateboarding.  Skating.  Making your home safer by:  Removing things from the floor or stairways that could make you trip.  Using grab bars in bathrooms and handrails by stairs.  Placing non-slip mats on floors and in bathtubs.  Improve lighting in dark areas. Contact a doctor if:  It gets even harder for you to pay attention or concentrate.  It gets even harder for you to remember things or learn new things.  You need more time than normal to finish things.  You become annoyed (irritable) more than before.  You are not able to deal with stress  as well.  You have more problems than before.  You have problems keeping your balance.  You are not able to react quickly when you should. Get help if you have any of these problems for more than 2 weeks:  Lasting (chronic) headaches.  Dizziness or trouble balancing.  Feeling sick to your stomach (nausea).  Seeing (vision) problems.  Being affected by noises or light more than normal.  Feeling sad, low, down in the dumps, blue, gloomy, or empty (depressed).  Mood changes (mood swings).  Feeling of fear or nervousness about what may happen (anxiety).  Feeling annoyed.  Memory problems.  Problems concentrating or paying attention.  Sleep problems.  Feeling tired all the time. Get help right away if:  You have bad headaches or your headaches get worse.  You have weakness (even if it is in one hand, leg, or part of the face).  You have loss of feeling (numbness).  You feel off balance.  You keep throwing up (vomiting).  You feel tired.  One black center of your eye (pupil) is larger than the other.  You twitch or shake violently (convulse).  Your speech is not clear (slurred).  You are more confused, easily angered (agitated), or annoyed than before.  You have more trouble resting than before.  You are unable to recognize people or places.  You have neck pain.  It is difficult to wake you up.  You have unusual behavior changes.  You pass out (lose consciousness). This information is not intended to replace advice given to you by your health care provider. Make sure you discuss any questions you have with your health care provider. Document Released: 09/14/2009 Document Revised: 03/03/2016 Document Reviewed: 04/18/2013 Elsevier Interactive Patient Education  2017 [employer].

## 2016-09-28 DIAGNOSIS — H2510 Age-related nuclear cataract, unspecified eye: Secondary | ICD-10-CM | POA: Insufficient documentation

## 2016-09-28 HISTORY — PX: CATARACT EXTRACTION W/ INTRAOCULAR LENS IMPLANT: SHX1309

## 2016-09-29 DIAGNOSIS — H04123 Dry eye syndrome of bilateral lacrimal glands: Secondary | ICD-10-CM | POA: Insufficient documentation

## 2016-09-30 ENCOUNTER — Other Ambulatory Visit: Payer: Self-pay | Admitting: Family Medicine

## 2016-10-25 ENCOUNTER — Other Ambulatory Visit: Payer: Self-pay | Admitting: Family Medicine

## 2016-11-01 ENCOUNTER — Other Ambulatory Visit: Payer: Self-pay | Admitting: Family Medicine

## 2016-11-01 ENCOUNTER — Telehealth: Payer: Self-pay | Admitting: *Deleted

## 2016-11-01 NOTE — Telephone Encounter (Signed)
PA submitted for ambien. This was denied back in October. Patient is aware the insurance does not pay for ambien. Will submit PA anyway since its the first of the year and maybe something has changed but most likely will be denied

## 2016-11-02 NOTE — Telephone Encounter (Signed)
Received fax for more information on Ambien filled out and faxed back will wait on determination. - CF

## 2016-11-08 ENCOUNTER — Ambulatory Visit: Payer: Self-pay | Admitting: Family Medicine

## 2016-11-08 NOTE — Telephone Encounter (Signed)
Zolpidem has been denied.patient is aware her insurance does not cover this

## 2016-11-17 ENCOUNTER — Other Ambulatory Visit: Payer: Self-pay | Admitting: *Deleted

## 2016-11-17 DIAGNOSIS — E119 Type 2 diabetes mellitus without complications: Secondary | ICD-10-CM

## 2016-11-17 MED ORDER — ACCU-CHEK AVIVA VI SOLN: 99 refills | Status: DC

## 2016-11-17 MED ORDER — GLUCOSE BLOOD VI STRP: ORAL_STRIP | 3 refills | Status: DC

## 2016-11-17 MED ORDER — ACCU-CHEK FASTCLIX LANCETS MISC: 4 refills | Status: DC

## 2016-11-21 ENCOUNTER — Other Ambulatory Visit: Payer: Self-pay | Admitting: Family Medicine

## 2016-11-21 DIAGNOSIS — Z1231 Encounter for screening mammogram for malignant neoplasm of breast: Secondary | ICD-10-CM

## 2016-11-22 ENCOUNTER — Encounter: Payer: Self-pay | Admitting: Family Medicine

## 2016-11-22 ENCOUNTER — Ambulatory Visit (INDEPENDENT_AMBULATORY_CARE_PROVIDER_SITE_OTHER): Payer: Medicare Other | Admitting: Family Medicine

## 2016-11-22 VITALS — BP 173/59 | HR 74 | Ht 67.0 in | Wt 248.0 lb

## 2016-11-22 DIAGNOSIS — H9201 Otalgia, right ear: Secondary | ICD-10-CM | POA: Diagnosis not present

## 2016-11-22 DIAGNOSIS — R200 Anesthesia of skin: Secondary | ICD-10-CM | POA: Diagnosis not present

## 2016-11-22 DIAGNOSIS — I10 Essential (primary) hypertension: Secondary | ICD-10-CM

## 2016-11-22 DIAGNOSIS — E119 Type 2 diabetes mellitus without complications: Secondary | ICD-10-CM

## 2016-11-22 DIAGNOSIS — H60541 Acute eczematoid otitis externa, right ear: Secondary | ICD-10-CM

## 2016-11-22 DIAGNOSIS — R202 Paresthesia of skin: Secondary | ICD-10-CM

## 2016-11-22 LAB — POCT GLYCOSYLATED HEMOGLOBIN (HGB A1C): Hemoglobin A1C: 5.9

## 2016-11-22 NOTE — Patient Instructions (Addendum)
Recommend wearing wrist splint just at night for the next 3 weeks to see if the symptoms improve.  Recommend a trial of Flonase, 2 sprays in each nostril daily for 2 weeks to see if this helps with the right ear pain. Also recommend a trial of over-the-counter hydrocortisone cream. Can apply in the outer ear area once or twice a day and see if this helps with the dry skin and irritation.  If your cough and throat get worse over the next couple days and please give [name] a call back. If he develops a fever them please call immediately.

## 2016-11-22 NOTE — Progress Notes (Signed)
Subjective:    CC: DM  HPI:  Diabetes - no hypoglycemic events. No wounds or sores that are not healing well. No increased thirst or urination. Checking glucose at home. Taking medications as prescribed without any side effects.  She's also had right ear pain for about a month as well as a scratchy throat and cough for about 24 hours. No fever. No GI upset. Her husband is currently on antibiotics for an upper respiratory illness.  She also complains of some right hand numbness. She says it really just affects the fingers not relieved up home. She denies any known injury or trauma and feels like it affects all the fingers. The numbness comes and goes throughout the day. She really hasn't noticed much at night. She says sometimes she'll notice it after driving when she gets back home. She does have a wrist splint that she occasionally wears if her posterior wrist is causing some pain but doesn't wear it very often.  Past medical history, Surgical history, Family history not pertinant except as noted below, Social history, Allergies, and medications have been entered into the medical record, reviewed, and corrections made.   Review of Systems: No fevers, chills, night sweats, weight loss, chest pain, or shortness of breath.   Objective:    General: Well Developed, well nourished, and in no acute distress.  Neuro: Alert and oriented x3, extra-ocular muscles intact, sensation grossly intact.  HEENT: Normocephalic, atraumatic  Skin: Warm and dry, no rashes. Cardiac: Regular rate and rhythm, no murmurs rubs or gallops, no lower extremity edema.  Respiratory: Clear to auscultation bilaterally. Not using accessory muscles, speaking in full sentences. Neuro/MSK: Wrist with normal range of motion and strength. Fingers with normal strength. Negative Tennille's and Phalen's sign. Radial pulse 2+. Good color of the skin with good capillary refill.   Impression and Recommendations:     Diabetes-hemoglobin A1c looks absolutely fantastic today at 5.9 and she denies any hypoglycemic events. We'll continue current regimen and see her back in 3-4 months.  Right otalgia-ear exam looks completely normal but certainly she could have some eustachian tube dysfunction. She does have a little bit of dry skin at the canal to certainly could use some over-the-counter hydrocortisone once or twice a day to see if this provides relief. Next  Right numbness of fingers-unclear etiology. Negative Tennille and Phalen's sign but since she artery has a wrist splint it may be worth her trying to wear it just at bedtime for the next 2-3 weeks to see if her symptoms improve.\  Dermatitis of right ear canal-recommend a trial of over-the-counter hydrocortisone cream.

## 2016-11-25 ENCOUNTER — Telehealth: Payer: Self-pay | Admitting: *Deleted

## 2016-11-25 MED ORDER — OSELTAMIVIR PHOSPHATE 75 MG PO CAPS: 75.0000 mg | ORAL_CAPSULE | Freq: Two times a day (BID) | ORAL | 0 refills | Status: DC

## 2016-11-25 NOTE — Telephone Encounter (Signed)
Ok, rx sent

## 2016-11-25 NOTE — Telephone Encounter (Signed)
Pt called and stated that since her OV on Tuesday she is not feeling any better and her husband and son have been been dx the flu. She's achy, and still has a sore throat. Will send rx for tamiflu.Carolyn Lambert [place]

## 2016-11-25 NOTE — Telephone Encounter (Signed)
Pt informed.Vanya Carberry [place]  

## 2016-12-05 ENCOUNTER — Ambulatory Visit: Payer: Self-pay

## 2016-12-06 ENCOUNTER — Ambulatory Visit: Payer: Self-pay

## 2016-12-12 ENCOUNTER — Ambulatory Visit (INDEPENDENT_AMBULATORY_CARE_PROVIDER_SITE_OTHER): Payer: Medicare Other | Admitting: Family Medicine

## 2016-12-12 VITALS — BP 136/64 | HR 73

## 2016-12-12 DIAGNOSIS — I10 Essential (primary) hypertension: Secondary | ICD-10-CM

## 2016-12-12 NOTE — Progress Notes (Signed)
Hypertension-blood pressure looks fantastic. Continue current regimen. We'll monitor. Beatrice Lecher, MD

## 2016-12-12 NOTE — Progress Notes (Signed)
Patient came into clinic today for BP check. Denies any chest pain, palpations, headaches, vision changes, dizziness.BP in office was great. Routing to PCP for review.

## 2016-12-16 LAB — HM DIABETES EYE EXAM

## 2016-12-22 ENCOUNTER — Other Ambulatory Visit: Payer: Self-pay | Admitting: Family Medicine

## 2017-02-16 ENCOUNTER — Other Ambulatory Visit: Payer: Self-pay | Admitting: *Deleted

## 2017-02-16 MED ORDER — ACCU-CHEK GUIDE W/DEVICE KIT: 1.0000 | PACK | Freq: Two times a day (BID) | 0 refills | Status: DC

## 2017-02-16 NOTE — Progress Notes (Signed)
a 

## 2017-02-20 ENCOUNTER — Encounter: Payer: Self-pay | Admitting: Family Medicine

## 2017-02-20 ENCOUNTER — Ambulatory Visit (INDEPENDENT_AMBULATORY_CARE_PROVIDER_SITE_OTHER): Payer: Medicare Other | Admitting: Family Medicine

## 2017-02-20 VITALS — BP 140/82 | HR 72 | Ht 67.0 in | Wt 247.0 lb

## 2017-02-20 DIAGNOSIS — R7989 Other specified abnormal findings of blood chemistry: Secondary | ICD-10-CM | POA: Diagnosis not present

## 2017-02-20 DIAGNOSIS — F332 Major depressive disorder, recurrent severe without psychotic features: Secondary | ICD-10-CM

## 2017-02-20 DIAGNOSIS — E119 Type 2 diabetes mellitus without complications: Secondary | ICD-10-CM

## 2017-02-20 DIAGNOSIS — I10 Essential (primary) hypertension: Secondary | ICD-10-CM | POA: Diagnosis not present

## 2017-02-20 DIAGNOSIS — F5101 Primary insomnia: Secondary | ICD-10-CM

## 2017-02-20 LAB — POCT GLYCOSYLATED HEMOGLOBIN (HGB A1C): Hemoglobin A1C: 6

## 2017-02-20 MED ORDER — ZOLPIDEM TARTRATE 5 MG PO TABS: ORAL_TABLET | ORAL | 1 refills | Status: DC

## 2017-02-20 NOTE — Progress Notes (Signed)
Subjective:    CC: DM   HPI:  Diabetes - no hypoglycemic events. No wounds or sores that are not healing well. No increased thirst or urination. Checking glucose at home. Taking medications as prescribed without any side effects.  Hypertension- Pt denies chest pain, SOB, dizziness, or heart palpitations.  Taking meds as directed w/o problems.  Denies medication side effects.    Depression-she's currently on Cymbalta and Wellbutrin. Overall she's been doing well recently that she says some days it's difficult to get out of bed emotionally. Her sister recently passed away from bladder cancer. Her sister had declined any medical care and tell her son was able to get a court order to take her to hospital. At that point it was too late. She passed away about 6 weeks ago.  F/U insomnia - Uses Ambien 5 mg when necessary.He is requesting a refill today.  Past medical history, Surgical history, Family history not pertinant except as noted below, Social history, Allergies, and medications have been entered into the medical record, reviewed, and corrections made.   Review of Systems: No fevers, chills, night sweats, weight loss, chest pain, or shortness of breath.   Objective:    General: Well Developed, well nourished, and in no acute distress.  Neuro: Alert and oriented x3, extra-ocular muscles intact, sensation grossly intact.  HEENT: Normocephalic, atraumatic  Skin: Warm and dry, no rashes. Cardiac: Regular rate and rhythm, no murmurs rubs or gallops, no lower extremity edema.  Respiratory: Clear to auscultation bilaterally. Not using accessory muscles, speaking in full sentences.   Impression and Recommendations:    DM- Well controlled. Continue current regimen. Follow up in  4 months.   Lab Results  Component Value Date   HGBA1C 6.0 02/20/2017     HTN - Blood pressure borderline. We'll continue to monitor. Family history bladder cancer-patient has worried whether or not she needs to  be evaluated for this. I explained that the only way to evaluate for this is with a cystoscopy. She's not having any symptoms such as urgency incontinence or blood. She's not having any pain.  Insomnia-refill Ambien today. Continue to use sparingly.  Depression-continue Cymbalta and Wellbutrin.

## 2017-02-21 LAB — BASIC METABOLIC PANEL WITH GFR
BUN: 34 mg/dL — ABNORMAL HIGH (ref 7–25)
CO2: 28 mmol/L (ref 20–31)
Calcium: 9.4 mg/dL (ref 8.6–10.4)
Chloride: 105 mmol/L (ref 98–110)
Creat: 1.61 mg/dL — ABNORMAL HIGH (ref 0.60–0.93)
GFR, Est African American: 36 mL/min — ABNORMAL LOW (ref >=60)
GFR, Est Non African American: 31 mL/min — ABNORMAL LOW (ref >=60)
Glucose, Bld: 113 mg/dL — ABNORMAL HIGH (ref 65–99)
Potassium: 5.6 mmol/L — ABNORMAL HIGH (ref 3.5–5.3)
Sodium: 144 mmol/L (ref 135–146)

## 2017-02-21 NOTE — Addendum Note (Signed)
Addended by: Teddy Spike on: 02/21/2017 05:33 PM   Modules accepted: Orders

## 2017-03-01 ENCOUNTER — Other Ambulatory Visit: Payer: Self-pay | Admitting: Family Medicine

## 2017-03-08 LAB — BASIC METABOLIC PANEL WITH GFR
BUN: 28 mg/dL — ABNORMAL HIGH (ref 7–25)
CO2: 29 mmol/L (ref 20–31)
Calcium: 9.2 mg/dL (ref 8.6–10.4)
Chloride: 102 mmol/L (ref 98–110)
Creat: 1.52 mg/dL — ABNORMAL HIGH (ref 0.60–0.93)
GFR, Est African American: 38 mL/min — ABNORMAL LOW (ref >=60)
GFR, Est Non African American: 33 mL/min — ABNORMAL LOW (ref >=60)
Glucose, Bld: 173 mg/dL — ABNORMAL HIGH (ref 65–99)
Potassium: 5.3 mmol/L (ref 3.5–5.3)
Sodium: 141 mmol/L (ref 135–146)

## 2017-03-09 ENCOUNTER — Telehealth: Payer: Self-pay

## 2017-03-09 NOTE — Telephone Encounter (Signed)
Pt called, have you received and sent back the information regarding patient's diabetic shoes?

## 2017-03-09 NOTE — Telephone Encounter (Signed)
I filled it out yesterday. Ask Kenney Houseman if we have faxed it today.

## 2017-03-09 NOTE — Telephone Encounter (Signed)
Was this faxed?

## 2017-03-10 NOTE — Telephone Encounter (Signed)
Yes and placed in scan.Carolyn Lambert

## 2017-03-17 ENCOUNTER — Other Ambulatory Visit: Payer: Self-pay | Admitting: *Deleted

## 2017-03-17 MED ORDER — BUPROPION HCL ER (XL) 150 MG PO TB24: ORAL_TABLET | ORAL | 3 refills | Status: DC

## 2017-03-17 MED ORDER — DULOXETINE HCL 60 MG PO CPEP: 60.0000 mg | ORAL_CAPSULE | Freq: Every day | ORAL | 1 refills | Status: DC

## 2017-03-17 MED ORDER — BACLOFEN 10 MG PO TABS: ORAL_TABLET | ORAL | 1 refills | Status: DC

## 2017-03-17 MED ORDER — AMLODIPINE BESYLATE 5 MG PO TABS: 10.0000 mg | ORAL_TABLET | Freq: Every day | ORAL | 1 refills | Status: DC

## 2017-04-27 ENCOUNTER — Ambulatory Visit: Payer: Self-pay

## 2017-04-27 ENCOUNTER — Ambulatory Visit (INDEPENDENT_AMBULATORY_CARE_PROVIDER_SITE_OTHER): Payer: Medicare Other | Admitting: Family Medicine

## 2017-04-27 ENCOUNTER — Encounter: Payer: Self-pay | Admitting: Family Medicine

## 2017-04-27 VITALS — BP 134/65 | HR 62 | Ht 67.0 in | Wt 251.0 lb

## 2017-04-27 DIAGNOSIS — E042 Nontoxic multinodular goiter: Secondary | ICD-10-CM

## 2017-04-27 DIAGNOSIS — Z1231 Encounter for screening mammogram for malignant neoplasm of breast: Secondary | ICD-10-CM

## 2017-04-27 DIAGNOSIS — F5101 Primary insomnia: Secondary | ICD-10-CM | POA: Diagnosis not present

## 2017-04-27 DIAGNOSIS — I10 Essential (primary) hypertension: Secondary | ICD-10-CM

## 2017-04-27 DIAGNOSIS — Z0000 Encounter for general adult medical examination without abnormal findings: Secondary | ICD-10-CM | POA: Diagnosis not present

## 2017-04-27 MED ORDER — METOPROLOL SUCCINATE ER 100 MG PO TB24: 100.0000 mg | ORAL_TABLET | Freq: Every day | ORAL | 1 refills | Status: DC

## 2017-04-27 MED ORDER — ZOLPIDEM TARTRATE 5 MG PO TABS: ORAL_TABLET | ORAL | 3 refills | Status: DC

## 2017-04-27 NOTE — Patient Instructions (Signed)
Order placed for your mammogram Go to lab when you are fasting.  REad about Shingrix vaccines and ask your pharmacist about it Try to get to the [facility] 3 days per week.   Your sleep medicine was refilled I increased your metoprolol to 100mg  and sent to your mail order to bring your blood pressure down a bit.

## 2017-04-27 NOTE — Progress Notes (Signed)
Subjective:   Carolyn Lambert is a 76 y.o. female who presents for Medicare Annual (Subsequent) preventive examination.  Review of Systems:  Comprehensive ROS is negative.         Objective:     Vitals: BP (!) 156/61   Pulse 61   Ht _0  (1.702 m)   Wt 251 lb (113.9 kg)   BMI 39.31 kg/m   Body mass index is 39.31 kg/m.  Physical Exam  Constitutional: She is oriented to person, place, and time. She appears well-developed and well-nourished.  HENT:  Head: Normocephalic and atraumatic.  Right Ear: External ear normal.  Left Ear: External ear normal.  Nose: Nose normal.  Mouth/Throat: Oropharynx is clear and moist.  TMs and canals are clear.   Eyes: Pupils are equal, round, and reactive to light. Conjunctivae and EOM are normal.  Neck: Neck supple. No thyromegaly present.  Cardiovascular: Normal rate, regular rhythm and normal heart sounds.   Pulmonary/Chest: Effort normal and breath sounds normal. She has no wheezes.  Lymphadenopathy:    She has no cervical adenopathy.  Neurological: She is alert and oriented to person, place, and time.  Skin: Skin is warm and dry.  Psychiatric: She has a normal mood and affect.     Tobacco History  Smoking Status  . Never Smoker  Smokeless Tobacco  . Never Used     Counseling given: Not Answered   Past Medical History:  Diagnosis Date  . Anxiety and depression   . Arthritis   . Chronic fatigue syndrome   . Diabetes mellitus   . DVT (deep venous thrombosis) ([place])    s/p knee replacmet.   . Fibromyalgia   . GERD (gastroesophageal reflux disease)   . Hypercholesteremia   . Hypertension   . Major depressive disorder 03/20/2012  . Obesity   . Peripheral edema   . Sinusitis July 2013   Past Surgical History:  Procedure Laterality Date  . ABDOMINAL HYSTERECTOMY    . BACK SURGERY     x 2  . REPLACEMENT TOTAL KNEE Bilateral    2 on left and 1 on right, Dr Wynelle Link  . ROTATOR CUFF REPAIR Right   . TONSILLECTOMY      Family History  Problem Relation Age of Onset  . Hypertension Mother   . Atrial fibrillation Mother   . Diabetes Mother   . Hyperlipidemia Mother   . Cancer Father        lung  . Diabetes type II Father   . Aneurysm Father        abdominal  . Depression Daughter   . Colon cancer Neg Hx    History  Sexual Activity  . Sexual activity: Not Currently    Outpatient Encounter Prescriptions as of 04/27/2017  Medication Sig  . ACCU-CHEK FASTCLIX LANCETS MISC For testing twice daily. Dx code: E11.9  . amLODipine (NORVASC) 5 MG tablet Take 2 tablets (10 mg total) by mouth daily.  Marland Kitchen aspirin EC 81 MG tablet Take 81 mg by mouth daily.  Marland Kitchen atorvastatin (LIPITOR) 20 MG tablet Take 1 tablet (20 mg total) by mouth daily.  . baclofen (LIORESAL) 10 MG tablet TAKE 1 TABLET BY MOUTH 2  TIMES DAILY. AS DIRECTED  . Blood Glucose Calibration (ACCU-CHEK AVIVA) SOLN For calibration of glucose meter as needed  . Blood Glucose Monitoring Suppl (ACCU-CHEK GUIDE) w/Device KIT 1 each by Does not apply route 2 (two) times daily. Dx:E11.9  . buPROPion (WELLBUTRIN XL) 150 MG 24  hr tablet TAKE 1 TABLET(150 MG) BY MOUTH TWICE DAILY AT 8 AM AND AT 3 PM  . DULoxetine (CYMBALTA) 60 MG capsule Take 1 capsule (60 mg total) by mouth daily.  Marland Kitchen glucose blood (ACCU-CHEK SMARTVIEW) test strip Use to check blood sugars 2 times per day  . metFORMIN (GLUCOPHAGE) 1000 MG tablet TAKE 1 TABLET BY MOUTH TWO  TIMES DAILY  . metoprolol succinate (TOPROL-XL) 100 MG 24 hr tablet Take 1 tablet (100 mg total) by mouth daily. Take with or immediately following a meal.  . zolpidem (AMBIEN) 5 MG tablet TAKE 1 TO 1.5 TABLETS BY MOUTH ONCE AS DIRECTED  . [DISCONTINUED] metoprolol succinate (TOPROL-XL) 50 MG 24 hr tablet TAKE 1 TABLET BY MOUTH  DAILY  . [DISCONTINUED] zolpidem (AMBIEN) 5 MG tablet TAKE 1 TO 1.5 TABLETS BY MOUTH ONCE AS DIRECTED   No facility-administered encounter medications on file as of 04/27/2017.     Activities of  Daily Living No flowsheet data found.  Patient Care Team: Hali Marry, MD as PCP - General (Family Medicine)    Assessment:      Exercise Activities and Dietary recommendations    Goals    None     Fall Risk Fall Risk  04/27/2017 04/25/2016 08/18/2015 05/06/2014 02/04/2014  Falls in the past year? No Yes No Yes Yes  Number falls in past yr: - 2 or more - 1 1  Injury with Fall? - No - Yes Yes  Risk Factor Category  - High Fall Risk - High Fall Risk High Fall Risk  Risk for fall due to : - - Impaired balance/gait Impaired balance/gait Impaired balance/gait  Follow up - Education provided;Falls prevention discussed - - -   Depression Screen PHQ 2/9 Scores 04/27/2017 08/08/2016 07/11/2016 07/11/2016  PHQ - 2 Score 0 _0 PHQ- 9 Score - _1 Cognitive Function        Immunization History  Administered Date(s) Administered  . Influenza, High Dose Seasonal PF 07/11/2016  . Influenza,inj,Quad PF,36+ Mos 06/17/2014, 06/02/2015  . Influenza-Unspecified 08/10/2013  . Pneumococcal Conjugate-13 08/18/2015  . Pneumococcal Polysaccharide-23 08/02/2008  . Td 03/26/2008   Screening Tests Health Maintenance  Topic Date Due  . OPHTHALMOLOGY EXAM  04/20/2017  . INFLUENZA VACCINE  05/10/2017  . URINE MICROALBUMIN  08/08/2017  . HEMOGLOBIN A1C  08/23/2017  . FOOT EXAM  02/20/2018  . TETANUS/TDAP  03/26/2018  . DEXA SCAN  Completed  . PNA vac Low Risk Adult  Completed      Plan:      I have personally reviewed and noted the following in the patient's chart:   . Medical and social history - updated . Use of alcohol, tobacco or illicit drugs  . Current medications and supplements - updated . Functional ability and status . Nutritional status . Physical activity . Advanced directives -HPA on file . List of other physicians - no new providers.  Marland Kitchen Hospitalizations, surgeries, and ER visits in previous 12 months . Vitals . Screenings to include cognitive,  depression, and falls . Referrals and appointments   Order placed for your mammogram Go to lab when you are fasting.  REad about Shingrix vaccines and ask your pharmacist about it Try to get to the [facility] 3 days per week.   Your sleep medicine was refilled I increased your metoprolol to 19m and sent to your mail order to bring your blood pressure down a bit.    In  addition, I have reviewed and discussed with patient certain preventive protocols, quality metrics, and best practice recommendations. A written personalized care plan for preventive services as well as general preventive health recommendations were provided to patient.     Greydon Betke, MD  04/27/2017

## 2017-05-08 LAB — COMPLETE METABOLIC PANEL WITH GFR
ALT: 8 U/L (ref 6–29)
AST: 10 U/L (ref 10–35)
Albumin: 4 g/dL (ref 3.6–5.1)
Alkaline Phosphatase: 81 U/L (ref 33–130)
BUN: 26 mg/dL — ABNORMAL HIGH (ref 7–25)
CO2: 26 mmol/L (ref 20–31)
Calcium: 9.2 mg/dL (ref 8.6–10.4)
Chloride: 109 mmol/L (ref 98–110)
Creat: 1.15 mg/dL — ABNORMAL HIGH (ref 0.60–0.93)
GFR, Est African American: 53 mL/min — ABNORMAL LOW (ref >=60)
GFR, Est Non African American: 46 mL/min — ABNORMAL LOW (ref >=60)
Glucose, Bld: 110 mg/dL — ABNORMAL HIGH (ref 65–99)
Potassium: 4.7 mmol/L (ref 3.5–5.3)
Sodium: 145 mmol/L (ref 135–146)
Total Bilirubin: 0.5 mg/dL (ref 0.2–1.2)
Total Protein: 6.1 g/dL (ref 6.1–8.1)

## 2017-05-08 LAB — LIPID PANEL W/REFLEX DIRECT LDL
Cholesterol: 163 mg/dL (ref <200)
HDL: 54 mg/dL (ref >50)
LDL-Cholesterol: 85 mg/dL
Non-HDL Cholesterol (Calc): 109 mg/dL (ref <130)
Total CHOL/HDL Ratio: 3 Ratio (ref <5.0)
Triglycerides: 142 mg/dL (ref <150)

## 2017-05-08 LAB — TSH: TSH: 1.32 mIU/L

## 2017-06-07 ENCOUNTER — Other Ambulatory Visit: Payer: Self-pay | Admitting: Family Medicine

## 2017-06-07 DIAGNOSIS — F5101 Primary insomnia: Secondary | ICD-10-CM

## 2017-06-07 MED ORDER — ZOLPIDEM TARTRATE 5 MG PO TABS: 5.0000 mg | ORAL_TABLET | Freq: Every evening | ORAL | 3 refills | Status: DC | PRN

## 2017-06-07 NOTE — Progress Notes (Signed)
Sig corrected on Ambien prescription. Reprinted and refaxed the [facility].

## 2017-06-23 ENCOUNTER — Ambulatory Visit: Payer: Self-pay | Admitting: Family Medicine

## 2017-07-20 ENCOUNTER — Ambulatory Visit: Payer: Self-pay | Admitting: Family Medicine

## 2017-07-20 NOTE — Progress Notes (Deleted)
Subjective:    CC: BP, DM, depression  HPI: Hypertension- Pt denies chest pain, SOB, dizziness, or heart palpitations.  Taking meds as directed w/o problems.  Denies medication side effects.    Diabetes - no hypoglycemic events. No wounds or sores that are not healing well. No increased thirst or urination. Checking glucose at home. Taking medications as prescribed without any side effects.  F/U MDD -   Past medical history, Surgical history, Family history not pertinant except as noted below, Social history, Allergies, and medications have been entered into the medical record, reviewed, and corrections made.   Review of Systems: No fevers, chills, night sweats, weight loss, chest pain, or shortness of breath.   Objective:    General: Well Developed, well nourished, and in no acute distress.  Neuro: Alert and oriented x3, extra-ocular muscles intact, sensation grossly intact.  HEENT: Normocephalic, atraumatic  Skin: Warm and dry, no rashes. Cardiac: Regular rate and rhythm, no murmurs rubs or gallops, no lower extremity edema.  Respiratory: Clear to auscultation bilaterally. Not using accessory muscles, speaking in full sentences.   Impression and Recommendations:    HTN -    DM-     MDD -

## 2017-07-25 ENCOUNTER — Encounter: Payer: Self-pay | Admitting: Family Medicine

## 2017-07-25 ENCOUNTER — Ambulatory Visit (INDEPENDENT_AMBULATORY_CARE_PROVIDER_SITE_OTHER): Payer: Medicare Other | Admitting: Family Medicine

## 2017-07-25 VITALS — BP 142/88 | HR 58 | Ht 67.0 in | Wt 254.0 lb

## 2017-07-25 DIAGNOSIS — E119 Type 2 diabetes mellitus without complications: Secondary | ICD-10-CM | POA: Diagnosis not present

## 2017-07-25 DIAGNOSIS — I10 Essential (primary) hypertension: Secondary | ICD-10-CM

## 2017-07-25 DIAGNOSIS — R809 Proteinuria, unspecified: Secondary | ICD-10-CM

## 2017-07-25 DIAGNOSIS — E1129 Type 2 diabetes mellitus with other diabetic kidney complication: Secondary | ICD-10-CM

## 2017-07-25 DIAGNOSIS — F332 Major depressive disorder, recurrent severe without psychotic features: Secondary | ICD-10-CM

## 2017-07-25 DIAGNOSIS — Z23 Encounter for immunization: Secondary | ICD-10-CM | POA: Diagnosis not present

## 2017-07-25 LAB — POCT UA - MICROALBUMIN
Creatinine, POC: 100 mg/dL
Microalbumin Ur, POC: 30 mg/L

## 2017-07-25 LAB — POCT GLYCOSYLATED HEMOGLOBIN (HGB A1C): Hemoglobin A1C: 6.5

## 2017-07-25 MED ORDER — DULOXETINE HCL 30 MG PO CPEP: 30.0000 mg | ORAL_CAPSULE | Freq: Every day | ORAL | 0 refills | Status: DC

## 2017-07-25 MED ORDER — DULOXETINE HCL 60 MG PO CPEP: 60.0000 mg | ORAL_CAPSULE | Freq: Every day | ORAL | 1 refills | Status: DC

## 2017-07-25 MED ORDER — METFORMIN HCL 1000 MG PO TABS: 1000.0000 mg | ORAL_TABLET | Freq: Two times a day (BID) | ORAL | 1 refills | Status: DC

## 2017-07-25 MED ORDER — ATORVASTATIN CALCIUM 20 MG PO TABS: 20.0000 mg | ORAL_TABLET | Freq: Every day | ORAL | 3 refills | Status: DC

## 2017-07-25 MED ORDER — AMLODIPINE BESYLATE 5 MG PO TABS: 10.0000 mg | ORAL_TABLET | Freq: Every day | ORAL | 1 refills | Status: DC

## 2017-07-25 NOTE — Patient Instructions (Signed)
Takes a 60mg  cymbalta in the morning and the 30 mg in the evening.

## 2017-07-25 NOTE — Progress Notes (Addendum)
Subjective:    CC: DM, HTN  HPI: Diabetes - no hypoglycemic events. No wounds or sores that are not healing well. No increased thirst or urination. Checking glucose at home. Taking medications as prescribed without any side effects.  Hypertension- Pt denies chest pain, SOB, dizziness, or heart palpitations.  Taking meds as directed w/o problems.  Denies medication side effects.  Increase her metoprolol when she was here in July.   Follow-up major depressive disorder-she no longer sees Dr. Gaspar Bidding. In fact she hasn't been in for couple years. She says he mostly works from home now. She reports feeling down and depressed more than half the days. And we will recently she's felt more fatigued wanting to sleep during the daytime. Sleepy excessively really. She wonders if we could go up on her Cymbalta. She is still taking her Wellbutrin as well.   Past medical history, Surgical history, Family history not pertinant except as noted below, Social history, Allergies, and medications have been entered into the medical record, reviewed, and corrections made.   Review of Systems: No fevers, chills, night sweats, weight loss, chest pain, or shortness of breath.   Objective:    General: Well Developed, well nourished, and in no acute distress.  Neuro: Alert and oriented x3, extra-ocular muscles intact, sensation grossly intact.  HEENT: Normocephalic, atraumatic  Skin: Warm and dry, no rashes. Cardiac: Regular rate and rhythm, no murmurs rubs or gallops, no lower extremity edema.  Respiratory: Clear to auscultation bilaterally. Not using accessory muscles, speaking in full sentences.   Impression and Recommendations:    DM- Well controlled overall but A1c went up from 6.0-6.5 today from back in May. She's also gained about 3 pounds. She is on aspirin and a statin. Intolerant to ace inhibitors and arms.  HTN - BP borderline elevated today but better at last OV.  She is getting some exercise but not  consistently. Just encourage her to be more consistent with that.  MDD - PHQ 9 score of 16. will try increasing her Cymbalta to a total of 90 mg daily. If after 3-4 weeks she's not feeling some improvement in mood then recommend that we get her back in with psychiatry. She would be a new referral. Could consider Dr. De Nurse downstairs.  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 We'll continue to monitor.

## 2017-07-26 ENCOUNTER — Ambulatory Visit: Payer: Self-pay

## 2017-08-02 ENCOUNTER — Telehealth: Payer: Self-pay | Admitting: *Deleted

## 2017-08-02 NOTE — Telephone Encounter (Signed)
Pt lvm stating that she thought that she would be getting a refill of her hydrocodone 5-325. She reports that this is not something that she takes often and she hasn't had this since 2016. Will fwd to pcp for advice.Carolyn Lambert

## 2017-08-02 NOTE — Telephone Encounter (Signed)
We can but she will have to make appt to document why using it, will need to fill out pain contract and do a UDS.    Has she taken tramadol before?  It can help with pain but don't have to complete a pain contract, etc?

## 2017-08-03 NOTE — Telephone Encounter (Signed)
Pt advised and she stated that she has used Tramadol and it is not as effective. Told her because of the new guidelines she must make an appointment and submit a urine sample to obtain the medication. She voiced understanding.Carolyn Lambert Carolyn Lambert

## 2017-08-05 ENCOUNTER — Emergency Department (INDEPENDENT_AMBULATORY_CARE_PROVIDER_SITE_OTHER): Payer: Medicare Other

## 2017-08-05 ENCOUNTER — Encounter: Payer: Self-pay | Admitting: Emergency Medicine

## 2017-08-05 ENCOUNTER — Emergency Department
Admission: EM | Admit: 2017-08-05 | Discharge: 2017-08-05 | Disposition: A | Discharge status: Home / Self Care | Payer: Medicare Other | Source: Home / Self Care | Attending: Family Medicine | Admitting: Family Medicine

## 2017-08-05 DIAGNOSIS — S20212A Contusion of left front wall of thorax, initial encounter: Secondary | ICD-10-CM

## 2017-08-05 DIAGNOSIS — W19XXXA Unspecified fall, initial encounter: Secondary | ICD-10-CM

## 2017-08-05 DIAGNOSIS — S299XXA Unspecified injury of thorax, initial encounter: Secondary | ICD-10-CM | POA: Diagnosis not present

## 2017-08-05 NOTE — ED Triage Notes (Signed)
Patient fell at home yesterday; now left side ribcage sore with some edema anterior; also hurt knee but is not concerned about it. Presents walking with cane which she uses on daily basis.

## 2017-08-05 NOTE — ED Provider Notes (Signed)
[facility]    CSN: [id number] Arrival date & time: 08/05/17  6606     History   Chief Complaint Chief Complaint  Patient presents with  . Chest Pain    left ribs/ fell    HPI Carolyn Lambert is a 76 y.o. female.   Patient reports that she slipped on wet pavement while climbing out of her car yesterday.  No loss of consciousness or head injury.  She reports that she hit her left chest and complains of persistent pain in her left anterior ribs but no shortness of breath.  She bumped her left knee but has minimal pain and no swelling.   The history is provided by the patient.  Chest Pain  Pain location:  L chest Pain quality: aching   Pain radiates to:  Does not radiate Pain severity:  Mild Onset quality:  Sudden Duration:  1 day Timing:  Constant Progression:  Improving Chronicity:  Recurrent Context: breathing and movement   Relieved by:  Nothing Worsened by:  Coughing, deep breathing and certain positions Ineffective treatments: ice pack. Associated symptoms: no abdominal pain, no altered mental status, no back pain, no cough, no diaphoresis, no dizziness, no fatigue, no fever, no nausea, no near-syncope, no numbness, no palpitations, no shortness of breath, no syncope, no vomiting and no weakness   Risk factors: obesity     Past Medical History:  Diagnosis Date  . Anxiety and depression   . Arthritis   . Chronic fatigue syndrome   . Diabetes mellitus   . DVT (deep venous thrombosis) ([place])    s/p knee replacmet.   . Fibromyalgia   . GERD (gastroesophageal reflux disease)   . Hypercholesteremia   . Hypertension   . Major depressive disorder 03/20/2012  . Obesity   . Peripheral edema   . Sinusitis July 2013    Patient Active Problem List   Diagnosis Date Noted  . Microalbuminuria due to type 2 diabetes mellitus ([place]) 07/25/2017  . Obesity, Class II, BMI 35-39.9, with comorbidity 04/25/2016  . Nocturnal hypoxemia 02/24/2015  . Insomnia  02/16/2015  . Essential hypertension 09/16/2014  . S/P knee replacement 08/25/2014  . Lumbar spondylosis 05/06/2014  . Dry eye syndrome 02/06/2014  . Cataract 02/06/2014  . Macular drusen 02/06/2014  . MDD (major depressive disorder), recurrent episode, severe ([place]) 01/15/2014  . Post traumatic stress disorder (PTSD) 01/15/2014  . Thyroid nodule 11/08/2013  . Left foot drop 10/14/2013  . DVT (deep venous thrombosis) ([place]) 10/11/2013  . 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 Multinodular goiter 08/02/2013  . Diabetes mellitus, type II ([place]) 04/19/2013  . Pure hypercholesterolemia 04/19/2013  . Major depressive disorder 03/20/2012    Past Surgical History:  Procedure Laterality Date  . ABDOMINAL HYSTERECTOMY    . BACK SURGERY     x 2  . REPLACEMENT TOTAL KNEE Bilateral    2 on left and 1 on right, Dr Wynelle Link  . ROTATOR CUFF REPAIR Right   . TONSILLECTOMY      OB History    No data available       Home Medications    Prior to Admission medications   Medication Sig Start Date End Date Taking? Authorizing Provider  ACCU-CHEK FASTCLIX LANCETS MISC For testing twice daily. Dx code: E11.9 11/17/16   Hali Marry, MD  amLODipine (NORVASC) 5 MG tablet Take 2 tablets (10 mg total) by mouth daily. 07/25/17   Hali Marry, MD  aspirin  EC 81 MG tablet Take 81 mg by mouth daily.    [provider]  atorvastatin (LIPITOR) 20 MG tablet Take 1 tablet (20 mg total) by mouth daily. 07/25/17   Hali Marry, MD  baclofen (LIORESAL) 10 MG tablet TAKE 1 TABLET BY MOUTH 2  TIMES DAILY. AS DIRECTED 03/17/17   Hali Marry, MD  Blood Glucose Calibration (ACCU-CHEK AVIVA) SOLN For calibration of glucose meter as needed 11/17/16   Hali Marry, MD  Blood Glucose Monitoring Suppl (ACCU-CHEK GUIDE) w/Device KIT 1 each by Does not apply route 2 (two) times daily. Dx:E11.9 02/16/17   Hali Marry, MD  buPROPion (WELLBUTRIN XL) 150 MG  24 hr tablet TAKE 1 TABLET(150 MG) BY MOUTH TWICE DAILY AT 8 AM AND AT 3 PM 03/17/17   Hali Marry, MD  DULoxetine (CYMBALTA) 30 MG capsule Take 1 capsule (30 mg total) by mouth daily. 07/25/17   Hali Marry, MD  DULoxetine (CYMBALTA) 60 MG capsule Take 1 capsule (60 mg total) by mouth daily. 07/25/17   Hali Marry, MD  glucose blood (ACCU-CHEK SMARTVIEW) test strip Use to check blood sugars 2 times per day 11/17/16   Hali Marry, MD  metFORMIN (GLUCOPHAGE) 1000 MG tablet Take 1 tablet (1,000 mg total) by mouth 2 (two) times daily. 07/25/17   Hali Marry, MD  metoprolol succinate (TOPROL-XL) 100 MG 24 hr tablet Take 1 tablet (100 mg total) by mouth daily. Take with or immediately following a meal. 04/27/17   Hali Marry, MD  zolpidem (AMBIEN) 5 MG tablet Take 1-1.5 tablets (5-7.5 mg total) by mouth at bedtime as needed for sleep. 06/07/17   Hali Marry, MD    Family History Family History  Problem Relation Age of Onset  . Hypertension Mother   . Atrial fibrillation Mother   . Diabetes Mother   . Hyperlipidemia Mother   . Cancer Father        lung  . Diabetes type II Father   . Aneurysm Father        abdominal  . Depression Daughter   . Colon cancer Neg Hx     Social History Social History  Substance Use Topics  . Smoking status: Never Smoker  . Smokeless tobacco: Never Used  . Alcohol use No     Comment: Last drink April 2013     Allergies   Losartan; Gabapentin; Lisinopril; and Oxycodone   Review of Systems Review of Systems  Constitutional: Negative for diaphoresis, fatigue and fever.  HENT: Negative.   Eyes: Negative.   Respiratory: Negative for cough, chest tightness, shortness of breath, wheezing and stridor.   Cardiovascular: Positive for chest pain. Negative for palpitations, syncope and near-syncope.  Gastrointestinal: Negative for abdominal pain, nausea and vomiting.  Genitourinary: Negative.     Musculoskeletal: Negative for back pain.  Skin: Negative.   Neurological: Negative for dizziness, weakness and numbness.     Physical Exam Triage Vital Signs ED Triage Vitals  Enc Vitals Group     BP 08/05/17 0942 (!) 148/83     Pulse Rate 08/05/17 0942 64     Resp 08/05/17 0942 16     Temp 08/05/17 0942 97.7 F (36.5 C)     Temp Source 08/05/17 0942 Oral     SpO2 08/05/17 0942 99 %     Weight 08/05/17 0942 252 lb (114.3 kg)     Height 08/05/17 0942 '5\' 7"'  (1.702 m)  Head Circumference --      Peak Flow --      Pain Score 08/05/17 0943 6     Pain Loc --      Pain Edu? --      Excl. in [place]? --    No data found.   Updated Vital Signs BP (!) 148/83 (BP Location: Left Arm)   Pulse 64   Temp 97.7 F (36.5 C) (Oral)   Resp 16   Ht '5\' 7"'  (1.702 m)   Wt 252 lb (114.3 kg)   SpO2 99%   BMI 39.47 kg/m   Visual Acuity Right Eye Distance:   Left Eye Distance:   Bilateral Distance:    Right Eye Near:   Left Eye Near:    Bilateral Near:     Physical Exam  Constitutional: She appears well-developed and well-nourished. No distress.  HENT:  Head: Atraumatic.  Eyes: Pupils are equal, round, and reactive to light.  Neck: Normal range of motion.  Cardiovascular: Normal rate and regular rhythm.   Pulmonary/Chest: Effort normal and breath sounds normal. No respiratory distress. She has no wheezes. She has no rales. She exhibits tenderness.    Tenderness left anterior/lateral chest as noted on diagram.  Mild swelling but no crepitance or ecchymosis.  Abdominal: Soft.  Musculoskeletal:       Left knee: Normal. She exhibits normal range of motion.  Neurological: She is alert.  Skin: Skin is warm and dry.  Nursing note and vitals reviewed.    UC Treatments / Results  Labs (all labs ordered are listed, but only abnormal results are displayed) Labs Reviewed - No data to display  EKG  EKG Interpretation None       Radiology Dg Ribs Unilateral W/chest  Left  Result Date: 08/05/2017 CLINICAL DATA:  [place] yesterday with left rib injury. Left lateral lower rib pain, worse with inspiration and movement. Initial encounter EXAM: LEFT RIBS AND CHEST - 3+ VIEW COMPARISON:  05/20/2014 FINDINGS: The cardiomediastinal silhouette is unchanged. Heart size is within normal limits. Thoracic aorta is tortuous. The lungs are clear. No pleural effusion or pneumothorax is identified. Postoperative changes are again noted at the right shoulder. There has been interval healing of the left seventh rib fracture described on the prior study. No definite acute rib fracture is identified. IMPRESSION: No acute abnormality identified. Electronically Signed   By: Logan Bores M.D.   On: 08/05/2017 10:09    Procedures Procedures (including critical care time)  Medications Ordered in UC Medications - No data to display   Initial Impression / Assessment and Plan / UC Course  I have reviewed the triage vital signs and the nursing notes.  Pertinent labs & imaging results that were available during my care of the patient were reviewed by me and considered in my medical decision making (see chart for details).    Apply ice pack for 20 to 30 minutes, 3 to 4 times daily  Continue until pain and swelling decrease.  May take Tylenol as needed for pain. Followup with Family Doctor if not improved in one week.  If symptoms become significantly worse during the night or over the weekend, proceed to the local emergency room.   Final Clinical Impressions(s) / UC Diagnoses   Final diagnoses:  Contusion, chest wall, left, initial encounter    New Prescriptions New Prescriptions   No medications on file         Kandra Nicolas, MD 08/05/17 [phone]

## 2017-08-05 NOTE — Discharge Instructions (Signed)
Apply ice pack for 20 to 30 minutes, 3 to 4 times daily  Continue until pain and swelling decrease.  May take Tylenol as needed for pain. 

## 2017-09-21 ENCOUNTER — Ambulatory Visit: Payer: Self-pay

## 2017-10-06 ENCOUNTER — Ambulatory Visit: Payer: Self-pay

## 2017-10-06 ENCOUNTER — Other Ambulatory Visit: Payer: Self-pay | Admitting: Family Medicine

## 2017-10-26 ENCOUNTER — Ambulatory Visit: Payer: Medicare Other | Admitting: Family Medicine

## 2017-10-26 ENCOUNTER — Ambulatory Visit (INDEPENDENT_AMBULATORY_CARE_PROVIDER_SITE_OTHER): Payer: Medicare Other

## 2017-10-26 ENCOUNTER — Other Ambulatory Visit: Payer: Self-pay | Admitting: Family Medicine

## 2017-10-26 ENCOUNTER — Encounter: Payer: Self-pay | Admitting: Family Medicine

## 2017-10-26 VITALS — BP 128/72 | HR 61 | Ht 67.0 in | Wt 249.0 lb

## 2017-10-26 DIAGNOSIS — E119 Type 2 diabetes mellitus without complications: Secondary | ICD-10-CM | POA: Diagnosis not present

## 2017-10-26 DIAGNOSIS — Z1231 Encounter for screening mammogram for malignant neoplasm of breast: Secondary | ICD-10-CM

## 2017-10-26 DIAGNOSIS — F332 Major depressive disorder, recurrent severe without psychotic features: Secondary | ICD-10-CM

## 2017-10-26 DIAGNOSIS — F5101 Primary insomnia: Secondary | ICD-10-CM

## 2017-10-26 DIAGNOSIS — I10 Essential (primary) hypertension: Secondary | ICD-10-CM | POA: Diagnosis not present

## 2017-10-26 DIAGNOSIS — E785 Hyperlipidemia, unspecified: Secondary | ICD-10-CM | POA: Insufficient documentation

## 2017-10-26 LAB — POCT GLYCOSYLATED HEMOGLOBIN (HGB A1C): Hemoglobin A1C: 6.2

## 2017-10-26 MED ORDER — METFORMIN HCL 1000 MG PO TABS: 1000.0000 mg | ORAL_TABLET | Freq: Two times a day (BID) | ORAL | 3 refills | Status: DC

## 2017-10-26 MED ORDER — DULOXETINE HCL 60 MG PO CPEP: 60.0000 mg | ORAL_CAPSULE | Freq: Every day | ORAL | 3 refills | Status: DC

## 2017-10-26 MED ORDER — ZOLPIDEM TARTRATE 5 MG PO TABS: 5.0000 mg | ORAL_TABLET | Freq: Every evening | ORAL | 1 refills | Status: DC | PRN

## 2017-10-26 NOTE — Progress Notes (Signed)
Subjective:    CC: DM  HPI:  Diabetes - no hypoglycemic events. No wounds or sores that are not healing well. No increased thirst or urination. Checking glucose at home. Taking medications as prescribed without any side effects.  Hypertension- Pt denies chest pain, SOB, dizziness, or heart palpitations.  Taking meds as directed w/o problems.  Denies medication side effects.    Follow-up major depressive disorder-he is currently on Cymbalta 60 mg as well as Wellbutrin 150 mg daily.  She is happy with her current regimen.  She never actually went up to 90 mg on her Cymbalta.  But says she may end up just using 2 of the 30 mg tabs to make 60 mg.  She says it just makes her a little nervous to go up on the medication.  She did tell me that years ago when she was married with children she actually had shock treatment therapy to help her get over some of her childhood memories.  Follow-up insomnia-she does need a refill on her Ambien.  She does well and denies any side effects such as excess sedation during the daytime.   Past medical history, Surgical history, Family history not pertinant except as noted below, Social history, Allergies, and medications have been entered into the medical record, reviewed, and corrections made.   Review of Systems: No fevers, chills, night sweats, weight loss, chest pain, or shortness of breath.   Objective:    General: Well Developed, well nourished, and in no acute distress.  Neuro: Alert and oriented x3, extra-ocular muscles intact, sensation grossly intact.  HEENT: Normocephalic, atraumatic  Skin: Warm and dry, no rashes. Cardiac: Regular rate and rhythm, no murmurs rubs or gallops, no lower extremity edema.  Respiratory: Clear to auscultation bilaterally. Not using accessory muscles, speaking in full sentences.   Impression and Recommendations:    DM-well-controlled.  Hemoglobin A1c of 6.2 today which is fantastic.  Follow-up in 4 months.  HTN - Well  controlled. Continue current regimen. Follow up in  4 months.   MDD - PHQ - 9 score of 14, but didn't answer 2 of the questions.  Continue current regimen for now.  Offered counseling but she declined.  She says she is worked with multiple therapists over the years and is not interested in doing so right now.  She wants to just continue with her Cymbalta 60 mg tab.  Chronic insomnia-refill Ambien.

## 2017-10-27 ENCOUNTER — Telehealth: Payer: Self-pay | Admitting: *Deleted

## 2017-10-27 NOTE — Telephone Encounter (Signed)
[facility] Pre Authorization sent to cover my meds.

## 2017-10-30 ENCOUNTER — Other Ambulatory Visit: Payer: Self-pay | Admitting: Family Medicine

## 2017-10-30 DIAGNOSIS — F5101 Primary insomnia: Secondary | ICD-10-CM

## 2017-10-30 MED ORDER — METFORMIN HCL 1000 MG PO TABS: 1000.0000 mg | ORAL_TABLET | Freq: Two times a day (BID) | ORAL | 0 refills | Status: DC

## 2017-10-30 MED ORDER — ZOLPIDEM TARTRATE 5 MG PO TABS: 5.0000 mg | ORAL_TABLET | Freq: Every evening | ORAL | 1 refills | Status: DC | PRN

## 2017-10-30 NOTE — Addendum Note (Signed)
Addended by: Teddy Spike on: 10/30/2017 04:54 PM   Modules accepted: Orders

## 2017-10-30 NOTE — Telephone Encounter (Signed)
Metformin sent to local pharmacy. Called and informed pt that she will need to call and find out why they will not pay and she will possibly have to pay out of pocket. She stated that she would call and let us know.Carolyn Lambert

## 2017-10-30 NOTE — Telephone Encounter (Signed)
Pt called and stated that her mail order pharmacy denied the [place]. Also she stated that she needs about 10 pills of metformin [facility] [place] until she gets them from her mail order because she is about out. Thanks

## 2017-10-30 NOTE — Telephone Encounter (Signed)
Will send to pcp for signature.Carolyn Lambert [place]

## 2017-10-30 NOTE — Telephone Encounter (Signed)
Patient returned call - advised to [facility] to [facility] [place] .

## 2017-10-31 ENCOUNTER — Telehealth: Payer: Self-pay | Admitting: Family Medicine

## 2017-10-31 NOTE — Telephone Encounter (Signed)
Her insurance wants her to have tried Rozerme or Belsomra sleep aids before they will pain for ambien.  If she just wants to pay out of pocket for the [place] that is fine but if she wants to try one of the others then please let me know.

## 2017-10-31 NOTE — Telephone Encounter (Signed)
Called pt and informed her of the recommendations from the insurance company.  Pt stated that she will take what she has and will let us know if she would like to try something different later.Carolyn Lambert'

## 2017-10-31 NOTE — Telephone Encounter (Signed)
Received fax from Medicare and they denied coverage on Zolpidem due to patient has to have failure contraindication or intolerance to both Belsomra and rozerem. - CF

## 2017-11-12 ENCOUNTER — Other Ambulatory Visit: Payer: Self-pay | Admitting: Family Medicine

## 2018-01-24 ENCOUNTER — Other Ambulatory Visit: Payer: Self-pay | Admitting: Family Medicine

## 2018-01-24 ENCOUNTER — Telehealth: Payer: Self-pay | Admitting: Family Medicine

## 2018-01-24 MED ORDER — GLUCOSE BLOOD VI STRP: ORAL_STRIP | 3 refills | Status: DC

## 2018-01-24 NOTE — Telephone Encounter (Signed)
Pt advised.

## 2018-01-24 NOTE — Addendum Note (Signed)
Addended by: Beatrice Lecher D on: 01/24/2018 05:03 PM   Modules accepted: Orders

## 2018-01-24 NOTE — Telephone Encounter (Signed)
Ok to sent?

## 2018-01-24 NOTE — Telephone Encounter (Addendum)
Sent ot mail order.

## 2018-01-24 NOTE — Telephone Encounter (Signed)
Pt called and requesting her strips. Please update pt of status

## 2018-02-26 ENCOUNTER — Ambulatory Visit: Payer: Self-pay | Admitting: Family Medicine

## 2018-03-22 ENCOUNTER — Other Ambulatory Visit: Payer: Self-pay | Admitting: Family Medicine

## 2018-04-05 ENCOUNTER — Encounter: Payer: Self-pay | Admitting: Family Medicine

## 2018-04-13 ENCOUNTER — Other Ambulatory Visit: Payer: Self-pay | Admitting: Family Medicine

## 2018-05-17 ENCOUNTER — Ambulatory Visit: Payer: Medicare Other | Admitting: Family Medicine

## 2018-05-17 ENCOUNTER — Encounter: Payer: Self-pay | Admitting: Family Medicine

## 2018-05-17 VITALS — BP 134/61 | HR 74 | Ht 67.0 in | Wt 258.0 lb

## 2018-05-17 DIAGNOSIS — R809 Proteinuria, unspecified: Secondary | ICD-10-CM

## 2018-05-17 DIAGNOSIS — E042 Nontoxic multinodular goiter: Secondary | ICD-10-CM

## 2018-05-17 DIAGNOSIS — E1129 Type 2 diabetes mellitus with other diabetic kidney complication: Secondary | ICD-10-CM

## 2018-05-17 DIAGNOSIS — F5101 Primary insomnia: Secondary | ICD-10-CM

## 2018-05-17 DIAGNOSIS — I10 Essential (primary) hypertension: Secondary | ICD-10-CM

## 2018-05-17 DIAGNOSIS — E7800 Pure hypercholesterolemia, unspecified: Secondary | ICD-10-CM | POA: Diagnosis not present

## 2018-05-17 DIAGNOSIS — E119 Type 2 diabetes mellitus without complications: Secondary | ICD-10-CM | POA: Diagnosis not present

## 2018-05-17 LAB — POCT GLYCOSYLATED HEMOGLOBIN (HGB A1C): Hemoglobin A1C: 6.2 % — AB (ref 4.0–5.6)

## 2018-05-17 MED ORDER — ZOLPIDEM TARTRATE 5 MG PO TABS
5.0000 mg | ORAL_TABLET | Freq: Every evening | ORAL | 1 refills | Status: DC | PRN
Start: 2018-05-17 — End: 2019-02-11

## 2018-05-17 NOTE — Progress Notes (Signed)
Subjective:    CC: Pressure and diabetes  HPI:  Hypertension- Pt denies chest pain, SOB, dizziness, or heart palpitations.  Taking meds as directed w/o problems.  Denies medication side effects.    Diabetes - no hypoglycemic events. No wounds or sores that are not healing well. No increased thirst or urination. Checking glucose at home. Taking medications as prescribed without any side effects.  Goiter -no recent changes.  Follow-up insomnia-she was previously taking Ambien but her insurance would no longer cover it.she wants to stick with the Ambien and needs [place].    She overall is fairly inactive.  She is been staying inside most of the summer because of the heat.  Her husband has been going to the [facility] and she is considering start trying to go back again.  Past medical history, Surgical history, Family history not pertinant except as noted below, Social history, Allergies, and medications have been entered into the medical record, reviewed, and corrections made.   Review of Systems: No fevers, chills, night sweats, weight loss, chest pain, or shortness of breath.   Objective:    General: Well Developed, well nourished, and in no acute distress.  Neuro: Alert and oriented x3, extra- oular muscles intact, sensation grossly intact.  HEENT: Normocephalic, atraumatic  Skin: Warm and dry, no rashes. Cardiac: Regular rate and rhythm, no murmurs rubs or gallops, no lower extremity edema.  Respiratory: Clear to auscultation bilaterally. Not using accessory muscles, speaking in full sentences.   Impression and Recommendations:    HTN -due for CMP and lipid panel.  Will control.  Looks great.  Did encourage her to really work on increasing her activity level and [facility]  Just encouraged her approach it with a gradual progression of intensity of exercise.  Diabetes with microalbuminuria-does have her eye appointment scheduled  with Dr. Jodi Mourning.  Heme globin A1c looks great today at 6.2.  Continue current regimen.  Follow-up in 4 months.  Nodular goiter. due to recheck TSH.   Insomnia -refill Ambien and sent to [employer].  Last colonoscopy was 5 years ago they recommended that she start doing fecal occult testing 3 years after the scope.  Not exactly clear why if maybe she is just higher risk.  So given stool cards to take home today.  She is due for Tdap.  She will have to get this at the pharmacy because of her Medicare coverage.

## 2018-05-19 ENCOUNTER — Other Ambulatory Visit: Payer: Self-pay | Admitting: Family Medicine

## 2018-05-31 LAB — LIPID PANEL
Cholesterol: 167 mg/dL (ref <200)
HDL: 59 mg/dL (ref >50)
LDL Cholesterol (Calc): 84 mg/dL (calc)
Non-HDL Cholesterol (Calc): 108 mg/dL (calc) (ref <130)
Total CHOL/HDL Ratio: 2.8 (calc) (ref <5.0)
Triglycerides: 144 mg/dL (ref <150)

## 2018-05-31 LAB — COMPLETE METABOLIC PANEL WITH GFR
AG Ratio: 1.5 (calc) (ref 1.0–2.5)
ALT: 8 U/L (ref 6–29)
AST: 10 U/L (ref 10–35)
Albumin: 4.1 g/dL (ref 3.6–5.1)
Alkaline phosphatase (APISO): 79 U/L (ref 33–130)
BUN/Creatinine Ratio: 23 (calc) — ABNORMAL HIGH (ref 6–22)
BUN: 28 mg/dL — ABNORMAL HIGH (ref 7–25)
CO2: 28 mmol/L (ref 20–32)
Calcium: 9.3 mg/dL (ref 8.6–10.4)
Chloride: 105 mmol/L (ref 98–110)
Creat: 1.23 mg/dL — ABNORMAL HIGH (ref 0.60–0.93)
GFR, Est African American: 49 mL/min/{1.73_m2} — ABNORMAL LOW (ref >=60)
GFR, Est Non African American: 42 mL/min/{1.73_m2} — ABNORMAL LOW (ref >=60)
Globulin: 2.7 g/dL (calc) (ref 1.9–3.7)
Glucose, Bld: 142 mg/dL — ABNORMAL HIGH (ref 65–99)
Potassium: 4.5 mmol/L (ref 3.5–5.3)
Sodium: 143 mmol/L (ref 135–146)
Total Bilirubin: 0.5 mg/dL (ref 0.2–1.2)
Total Protein: 6.8 g/dL (ref 6.1–8.1)

## 2018-05-31 LAB — TSH: TSH: 2.7 mIU/L (ref 0.40–4.50)

## 2018-06-08 ENCOUNTER — Other Ambulatory Visit: Payer: Self-pay | Admitting: Family Medicine

## 2018-06-25 ENCOUNTER — Other Ambulatory Visit: Payer: Self-pay | Admitting: Family Medicine

## 2018-07-06 ENCOUNTER — Telehealth: Payer: Self-pay

## 2018-07-06 MED ORDER — AMBULATORY NON FORMULARY MEDICATION: 99 refills | Status: DC

## 2018-07-06 NOTE — Telephone Encounter (Signed)
Sent Roll[place] script to [facility].

## 2018-07-11 ENCOUNTER — Telehealth: Payer: Self-pay

## 2018-07-11 NOTE — Telephone Encounter (Signed)
Pt called- her order for walker for [facility] needs to red "Bariatris".  Attempted to call [facility] three separate times today to see if I could verbally add this on or if new RX needed to be sent, but was on hold for 8 minutes, 10 minutes, then 12 minutes and no answer.   I have hand-written "Bariatric" on the previous RX and re-faxed it to [facility]. Confirmation fax received.

## 2018-07-30 ENCOUNTER — Other Ambulatory Visit: Payer: Self-pay | Admitting: Family Medicine

## 2018-08-15 NOTE — Progress Notes (Deleted)
Subjective:   Carolyn Lambert is a 77 y.o. female who presents for Medicare Annual (Subsequent) preventive examination.  Review of Systems:  No ROS.  Medicare Wellness Visit. Additional risk factors are reflected in the social history.    Sleep patterns:    Home Safety/Smoke Alarms: Feels safe in home. Smoke alarms in place.  Living environment;  .   Female:   Pap- aged out unless necessary       Mammo-  utd     Dexa scan-  utd      CCS- utd      Objective:     Vitals: There were no vitals taken for this visit.  There is no height or weight on file to calculate BMI.  Advanced Directives 07/23/2016 07/22/2016  Does Patient Have a Medical Advance Directive? Yes Yes  Type of Advance Directive Healthcare Power of [place]  Does patient want to make changes to medical advance directive? No - Patient declined -  Copy of [place] in Chart? No - copy requested -    Tobacco Social History   Tobacco Use  Smoking Status Never Smoker  Smokeless Tobacco Never Used     Counseling given: Not Answered   Clinical Intake:                       Past Medical History:  Diagnosis Date  . Anxiety and depression   . Arthritis   . Chronic fatigue syndrome   . Diabetes mellitus   . DVT (deep venous thrombosis) ([place])    s/p knee replacmet.   . Fibromyalgia   . GERD (gastroesophageal reflux disease)   . Hypercholesteremia   . Hypertension   . Major depressive disorder 03/20/2012  . Obesity   . Peripheral edema   . Sinusitis July 2013   Past Surgical History:  Procedure Laterality Date  . ABDOMINAL HYSTERECTOMY    . BACK SURGERY     x 2  . REPLACEMENT TOTAL KNEE Bilateral    2 on left and 1 on right, Dr Wynelle Link  . ROTATOR CUFF REPAIR Right   . TONSILLECTOMY     Family History  Problem Relation Age of Onset  . Hypertension Mother   . Atrial fibrillation Mother   . Diabetes Mother   . Hyperlipidemia  Mother   . Cancer Father        lung  . Diabetes type II Father   . Aneurysm Father        abdominal  . Depression Daughter   . Colon cancer Neg Hx    Social History   Socioeconomic History  . Marital status: Married    Spouse name: Not on file  . Number of children: 5  . Years of education: Not on file  . Highest education level: Not on file  Occupational History  . Occupation: retired  Scientific laboratory technician  . Financial resource strain: Not on file  . Food insecurity:    Worry: Not on file    Inability: Not on file  . Transportation needs:    Medical: Not on file    Non-medical: Not on file  Tobacco Use  . Smoking status: Never Smoker  . Smokeless tobacco: Never Used  Substance and Sexual Activity  . Alcohol use: No    Comment: Last drink April 2013  . Drug use: No    Comment: Caffeine: Coffee -2-3  cups per day.   Marland Kitchen  Sexual activity: Not Currently  Lifestyle  . Physical activity:    Days per week: Not on file    Minutes per session: Not on file  . Stress: Not on file  Relationships  . Social connections:    Talks on phone: Not on file    Gets together: Not on file    Attends religious service: Not on file    Active member of club or organization: Not on file    Attends meetings of clubs or organizations: Not on file    Relationship status: Not on file  Other Topics Concern  . Not on file  Social History Narrative  . Not on file    Outpatient Encounter Medications as of 08/21/2018  Medication Sig  . ACCU-CHEK FASTCLIX LANCETS MISC For testing twice daily. Dx code: E11.9  . AMBULATORY NON FORMULARY MEDICATION Rollator with extra large seat. Fax to [facility] [phone]  . amLODipine (NORVASC) 5 MG tablet TAKE 2 TABLETS BY MOUTH  DAILY  . aspirin EC 81 MG tablet Take 81 mg by mouth daily.  Marland Kitchen atorvastatin (LIPITOR) 20 MG tablet Take 1 tablet (20 mg total) by mouth daily.  . baclofen (LIORESAL) 10 MG tablet TAKE 1 TABLET BY MOUTH 2  TIMES DAILY AS DIRECTED   . Blood Glucose Calibration (ACCU-CHEK AVIVA) SOLN For calibration of glucose meter as needed  . Blood Glucose Monitoring Suppl (ACCU-CHEK GUIDE) w/Device KIT 1 each by Does not apply route 2 (two) times daily. Dx:E11.9  . buPROPion (WELLBUTRIN XL) 150 MG 24 hr tablet TAKE 1 TABLET BY MOUTH  TWICE DAILY AT 8 AM AND AT  3 PM  . DULoxetine (CYMBALTA) 60 MG capsule Take 1 capsule (60 mg total) by mouth daily.  Marland Kitchen glucose blood (ACCU-CHEK SMARTVIEW) test strip Use to check blood sugars 2 times per day  . metFORMIN (GLUCOPHAGE) 1000 MG tablet Take 1 tablet (1,000 mg total) by mouth 2 (two) times daily.  . metoprolol succinate (TOPROL-XL) 100 MG 24 hr tablet TAKE 1 TABLET BY MOUTH  DAILY WITH OR IMMEDIATELY  FOLLOWING A MEAL  . zolpidem (AMBIEN) 5 MG tablet Take 1-1.5 tablets (5-7.5 mg total) by mouth at bedtime as needed for sleep.   No facility-administered encounter medications on file as of 08/21/2018.     Activities of Daily Living No flowsheet data found.  Patient Care Team: Hali Marry, MD as PCP - General (Family Medicine)    Assessment:   This is a routine wellness examination for [place].Physical assessment deferred to PCP.   Exercise Activities and Dietary recommendations   Diet  Breakfast: Lunch:  Dinner:       Goals   None     Fall Risk Fall Risk  05/17/2018 04/27/2017 04/25/2016 08/18/2015 05/06/2014  Falls in the past year? Yes No Yes No Yes  Number falls in past yr: 1 - 2 or more - 1  Injury with Fall? No - No - Yes  Risk Factor Category  - - High Fall Risk - High Fall Risk  Risk for fall due to : Impaired mobility - - Impaired balance/gait Impaired balance/gait  Follow up Falls prevention discussed - Education provided;Falls prevention discussed - -   Is the patient's home free of loose throw rugs in walkways, pet beds, electrical cords, etc?   {Blank single:19197::"yes","no"}      Grab bars in the bathroom? {Blank single:19197::"yes","no"}      Handrails  on the stairs?   {Blank single:19197::"yes","no"}  Adequate lighting?   {Blank single:19197::"yes","no"}   Depression Screen PHQ 2/9 Scores 05/17/2018 10/26/2017 07/25/2017 04/27/2017  PHQ - 2 Score '2 2 4 ' 0  PHQ- 9 Score '5 15 16 ' -     Cognitive Function     6CIT Screen 04/27/2017  What Year? 0 points  What month? 0 points  What time? 0 points  Count back from 20 0 points  Months in reverse 0 points  Repeat phrase 0 points  Total Score 0    Immunization History  Administered Date(s) Administered  . Influenza, High Dose Seasonal PF 07/11/2016, 07/25/2017  . Influenza,inj,Quad PF,6+ Mos 06/17/2014, 06/02/2015  . Influenza-Unspecified 08/10/2013  . Pneumococcal Conjugate-13 08/18/2015  . Pneumococcal Polysaccharide-23 08/02/2008  . Td 03/26/2008    Screening Tests Health Maintenance  Topic Date Due  . OPHTHALMOLOGY EXAM  12/16/2017  . INFLUENZA VACCINE  05/10/2018  . URINE MICROALBUMIN  07/25/2018  . TETANUS/TDAP  05/18/2019 (Originally 03/26/2018)  . HEMOGLOBIN A1C  11/17/2018  . FOOT EXAM  05/18/2019  . DEXA SCAN  Completed  . PNA vac Low Risk Adult  Completed       Plan:   ***   I have personally reviewed and noted the following in the patient's chart:   . Medical and social history . Use of alcohol, tobacco or illicit drugs  . Current medications and supplements . Functional ability and status . Nutritional status . Physical activity . Advanced directives . List of other physicians . Hospitalizations, surgeries, and ER visits in previous 12 months . Vitals . Screenings to include cognitive, depression, and falls . Referrals and appointments  In addition, I have reviewed and discussed with patient certain preventive protocols, quality metrics, and best practice recommendations. A written personalized care plan for preventive services as well as general preventive health recommendations were provided to patient.     Joanne Chars,  LPN  18/11/988

## 2018-08-21 ENCOUNTER — Ambulatory Visit: Payer: Self-pay

## 2018-08-23 ENCOUNTER — Other Ambulatory Visit: Payer: Self-pay | Admitting: Family Medicine

## 2018-08-27 ENCOUNTER — Other Ambulatory Visit: Payer: Self-pay | Admitting: Family Medicine

## 2018-09-02 ENCOUNTER — Other Ambulatory Visit: Payer: Self-pay | Admitting: Family Medicine

## 2018-10-11 ENCOUNTER — Other Ambulatory Visit: Payer: Self-pay | Admitting: Family Medicine

## 2018-10-11 DIAGNOSIS — Z1231 Encounter for screening mammogram for malignant neoplasm of breast: Secondary | ICD-10-CM

## 2018-10-31 ENCOUNTER — Ambulatory Visit: Payer: Medicare Other

## 2018-11-03 ENCOUNTER — Other Ambulatory Visit: Payer: Self-pay | Admitting: Family Medicine

## 2018-11-06 ENCOUNTER — Ambulatory Visit: Payer: Self-pay | Admitting: Family Medicine

## 2018-11-08 ENCOUNTER — Other Ambulatory Visit: Payer: Self-pay | Admitting: Family Medicine

## 2018-11-09 DIAGNOSIS — S82891A Other fracture of right lower leg, initial encounter for closed fracture: Secondary | ICD-10-CM | POA: Insufficient documentation

## 2018-11-13 HISTORY — PX: ORIF ANKLE FRACTURE: SUR919

## 2018-11-13 LAB — BASIC METABOLIC PANEL
BUN: 28 — AB (ref 4–21)
Creatinine: 1.2 — AB (ref 0.5–1.1)
Glucose: 209
Potassium: 4.4 (ref 3.4–5.3)
Sodium: 141 (ref 137–147)

## 2018-11-14 DIAGNOSIS — Z8781 Personal history of (healed) traumatic fracture: Secondary | ICD-10-CM | POA: Insufficient documentation

## 2018-11-14 DIAGNOSIS — Z9889 Other specified postprocedural states: Secondary | ICD-10-CM | POA: Insufficient documentation

## 2018-11-14 LAB — HEMOGLOBIN A1C: Hemoglobin A1C: 6.8

## 2018-12-27 ENCOUNTER — Telehealth: Payer: Self-pay

## 2018-12-27 DIAGNOSIS — S82891S Other fracture of right lower leg, sequela: Secondary | ICD-10-CM

## 2018-12-27 NOTE — Telephone Encounter (Signed)
Carolyn Lambert is being discharged from [facility]. She will need home health for nursing and PT and OT. Please advise.

## 2018-12-27 NOTE — Telephone Encounter (Signed)
OK for all services.

## 2018-12-27 NOTE — Telephone Encounter (Signed)
Order placed

## 2018-12-27 NOTE — Addendum Note (Signed)
Addended by: Narda Rutherford on: 12/27/2018 02:59 PM   Modules accepted: Orders

## 2019-01-02 ENCOUNTER — Telehealth: Payer: Self-pay | Admitting: Family Medicine

## 2019-01-02 NOTE — Telephone Encounter (Signed)
[facility] Called wanting to put in a Verbal Order  Verbal Order: Physical Therapy/ Occupational 01/01/19  "Original Request was on 2/23 but moved to 2/24 due to 'Pain Complaints' from PT'"  Frequency of  2 x 6 weeks 1 x 2 weeks  Strength Training for  Chief Technology Officer  [facility]: [phone]

## 2019-01-02 NOTE — Telephone Encounter (Signed)
Requesting an add on for a skilled nursing eval:  Skin irration under the breast and also med management.

## 2019-01-03 NOTE — Telephone Encounter (Signed)
Called and gave VO for pt's PT/OT to be moved out and for the add on for a skilled nursing evaluation for skin irration under her breast and medication management.Elouise Munroe, [place]

## 2019-01-10 ENCOUNTER — Encounter: Payer: Self-pay | Admitting: Family Medicine

## 2019-01-10 ENCOUNTER — Other Ambulatory Visit: Payer: Self-pay

## 2019-01-10 ENCOUNTER — Ambulatory Visit (INDEPENDENT_AMBULATORY_CARE_PROVIDER_SITE_OTHER): Payer: Medicare Other | Admitting: Family Medicine

## 2019-01-10 VITALS — BP 158/90 | HR 82 | Temp 97.9°F | Wt 258.0 lb

## 2019-01-10 DIAGNOSIS — S82891S Other fracture of right lower leg, sequela: Secondary | ICD-10-CM

## 2019-01-10 DIAGNOSIS — E119 Type 2 diabetes mellitus without complications: Secondary | ICD-10-CM

## 2019-01-10 DIAGNOSIS — F5101 Primary insomnia: Secondary | ICD-10-CM

## 2019-01-10 DIAGNOSIS — I10 Essential (primary) hypertension: Secondary | ICD-10-CM

## 2019-01-10 MED ORDER — AMLODIPINE BESYLATE 10 MG PO TABS: 10.0000 mg | ORAL_TABLET | Freq: Every day | ORAL | 0 refills | Status: DC

## 2019-01-10 MED ORDER — AMBULATORY NON FORMULARY MEDICATION: 0 refills | Status: DC

## 2019-01-10 NOTE — Progress Notes (Addendum)
Virtual Visit via Telephone Note  I connected with Carolyn Lambert on 01/10/19 at  3:00 PM EDT by telephone and verified that I am speaking with the correct person using two identifiers.   I discussed the limitations, risks, security and privacy concerns of performing an evaluation and management service by telephone and the availability of in person appointments. I also discussed with the patient that there may be a patient responsible charge related to this service. The patient expressed understanding and agreed to proceed.   Subjective:    CC: Hospital F/U   HPI:  [name] is a 78 year old female who presented to the [facility] emergency room with right ankle pain after a fall.  She presented on January 24.  She unfortunately has had multiple falls.  Unfortunately her fracture because of the pattern required surgical stabilization.  Initially the fracture was reduced and she was placed in a splint.  She underwent open reduction internal fixation of fracture of the right ankle on February 4.  She did have a episode of confusion postoperatively.  She was discharged to a skilled nursing facility on February 6 with Xarelto for 3 weeks as well as oxycodone for pain management.  She is followed up twice with the orthopedist once on February 19 and then again on March 18.  She is now back in her home. She has another f/u with the surgeon on the 01/23/2019.  She is doing home PT/OT now.  She does have DME including a walker.  She is using wheelchair.  He has been nonweightbearing on that ankle until the surgeon approves it she does have a follow-up next week and so hopefully will be able to start doing some weightbearing but she needs a prescription for a walker with wheels.  Diabetes - no hypoglycemic events. No wounds or sores that are not healing well. No increased thirst or urination. Checking glucose at home. Taking medications as prescribed without any side effects. Sugars were good at rehab.   She has done well with diet.   Hypertension- Pt denies chest pain, SOB, dizziness, or heart palpitations.  Taking meds as directed w/o problems.  Denies medication side effects.   She doesn't have a lot pain today.  BPs gave been 142/74.  Then 132/72 per therapist at her home today.  140/82, 122/87, 130/70. 150/82, 145/78  Follow-up insomnia-she says she is actually not really had to use her Ambien very much she did have to use some while she was in the nursing facility though.   Objective:    General: Speaking clearly in complete sentences without any shortness of breath.  Alert and oriented x3.  Normal judgment. No apparent acute distress.    Impression and Recommendations:   Right ankle fracture- she off her Xarelto. Now daily ASA.  Has follow-up in about a week with the surgeon at that point she is hoping she will actually be able to put weight on it she is getting PT but is nonweightbearing on that ankle so far.  DM -just reminded her to be strict with her diet especially since she is not active right now to help keep her blood sugars under good control.  Hopefully we can get an up-to-date A1c in the next month or 2.  HTN - discussed keeping an eye on the BP.  Of the blood pressures look good but there are several that are quite elevated including today's blood pressure.  If BPS are greater than.  I believe she is only  taking 1 amlodipine instead of 2 daily.  Otherwise she really should have run out a couple of months ago with her medication.  Can resend a new prescription to the pharmacy for amlodipine 10 mg daily.  Insomnia - she hasn't really needed it much not using nightly.     I discussed the assessment and treatment plan with the patient. The patient was provided an opportunity to ask questions and all were answered. The patient agreed with the plan and demonstrated an understanding of the instructions.   The patient was advised to call back or seek an in-person evaluation if the  symptoms worsen or if the condition fails to improve as anticipated.  Time spent in telephone encounter is  23 minutes.     Beatrice Lecher, MD

## 2019-01-10 NOTE — Progress Notes (Signed)
She has a f/u with the surgeon on the 01/23/2019.  She is doing home PT/OT now. No questions concerns.Maryruth Eve, Lahoma Crocker, CMA

## 2019-01-14 NOTE — Addendum Note (Signed)
Addended by: Beatrice Lecher D on: 01/14/2019 01:39 PM   Modules accepted: Level of Service

## 2019-01-22 ENCOUNTER — Telehealth: Payer: Self-pay

## 2019-01-30 ENCOUNTER — Other Ambulatory Visit: Payer: Self-pay

## 2019-01-30 DIAGNOSIS — I10 Essential (primary) hypertension: Secondary | ICD-10-CM

## 2019-01-30 MED ORDER — AMLODIPINE BESYLATE 10 MG PO TABS: 10.0000 mg | ORAL_TABLET | Freq: Every day | ORAL | 0 refills | Status: DC

## 2019-01-30 NOTE — Progress Notes (Signed)
Pt wanted Rx to mail order not local. Updated.

## 2019-02-11 ENCOUNTER — Other Ambulatory Visit: Payer: Self-pay

## 2019-02-11 DIAGNOSIS — F5101 Primary insomnia: Secondary | ICD-10-CM

## 2019-02-11 MED ORDER — ZOLPIDEM TARTRATE 5 MG PO TABS: 5.0000 mg | ORAL_TABLET | Freq: Every evening | ORAL | 1 refills | Status: DC | PRN

## 2019-02-11 NOTE — Telephone Encounter (Signed)
Carolyn Lambert would like a refill on Ambien to be sent to [facility].

## 2019-02-13 ENCOUNTER — Telehealth: Payer: Self-pay | Admitting: Family Medicine

## 2019-02-13 NOTE — Telephone Encounter (Signed)
Agree with above 

## 2019-02-13 NOTE — Telephone Encounter (Signed)
Joe from [facility] Pt called for verbal order:  [facility] - 2x week/6 weeks HH OT - Eval and treat for ADLs  Verbal provided

## 2019-02-27 ENCOUNTER — Telehealth: Payer: Self-pay | Admitting: Family Medicine

## 2019-02-27 ENCOUNTER — Encounter: Payer: Self-pay | Admitting: Family Medicine

## 2019-02-27 ENCOUNTER — Ambulatory Visit (INDEPENDENT_AMBULATORY_CARE_PROVIDER_SITE_OTHER): Payer: Medicare Other | Admitting: Family Medicine

## 2019-02-27 VITALS — BP 142/72 | HR 69 | Temp 98.3°F | Resp 18

## 2019-02-27 DIAGNOSIS — R21 Rash and other nonspecific skin eruption: Secondary | ICD-10-CM | POA: Diagnosis not present

## 2019-02-27 DIAGNOSIS — M25551 Pain in right hip: Secondary | ICD-10-CM

## 2019-02-27 DIAGNOSIS — M7989 Other specified soft tissue disorders: Secondary | ICD-10-CM | POA: Diagnosis not present

## 2019-02-27 MED ORDER — PERMETHRIN 5 % EX CREA: 1.0000 "application " | TOPICAL_CREAM | Freq: Once | CUTANEOUS | 0 refills | Status: AC

## 2019-02-27 MED ORDER — FUROSEMIDE 20 MG PO TABS: 20.0000 mg | ORAL_TABLET | Freq: Every day | ORAL | 3 refills | Status: DC | PRN

## 2019-02-27 NOTE — Telephone Encounter (Signed)
Pt advised and scheduled.

## 2019-02-27 NOTE — Progress Notes (Signed)
Pt reports that she thinks that she has an outbreak on her head mostly around her earlobes scaly. Since she has been home she never f/u 12/28/2018 (she had been to the beauty shop and had a perm and had her hair set 2x since)  She reports sweeling in her feet also since she has been home.  Maryruth Eve, Lahoma Crocker, CMA

## 2019-02-27 NOTE — Progress Notes (Signed)
Virtual Visit via Telephone Note  I connected with Carolyn Lambert on 02/27/19 at 11:10 AM EDT by telephone and verified that I am speaking with the correct person using two identifiers.   I discussed the limitations, risks, security and privacy concerns of performing an evaluation and management service by telephone and the availability of in person appointments. I also discussed with the patient that there may be a patient responsible charge related to this service. The patient expressed understanding and agreed to proceed.   Subjective:    CC:   HPI: She fell in Feb and fractured her right ankle and had to have surgery on 11/13/18. She went to [facility] for rehab and was there for 6 weeks. She has been home since March 20th.  She has just recently started putting weight on it.  She has 4 more weeks of PT. She is walking with her Rollator.   Pt reports that she thinks that she has an outbreak on her head mostly around her earlobes scaly. Since she has been home she never f/u 12/28/2018 (she had been to the beauty shop and had a perm and had her hair set 2x since). Says it has been very itchy on her head. It has felt dry and scaly and bumps.    She reports sweeling in her feet also since she has been home. Swelling is midway to her knee.  No med changes and watching her salt. No Chest pain or SOB.  Thinks her weight has gone up.   She also complains of pain in her right hip.  She says it gets quite unbearable at times.  She thinks it is because she was so deconditioned for so long after her surgery.  She was only able to put weight on it within the last month.  She denies any urinary symptoms or bladder infection.  Past medical history, Surgical history, Family history not pertinant except as noted below, Social history, Allergies, and medications have been entered into the medical record, reviewed, and corrections made.   Review of Systems: No fevers, chills, night sweats, weight  loss, chest pain, or shortness of breath.   Objective:    General: Speaking clearly in complete sentences without any shortness of breath.  Alert and oriented x3.  Normal judgment. No apparent acute distress.    Impression and Recommendations:   Lower extremity edema -no recent changes to medication.  She says she thinks she may have gained some weight.  She is not having any chest pain or shortness of breath to indicate heart failure or cardiac disease as a cause.  Normally I would do a little bit of blood work just to check renal function etc.  Though she did have stable renal function with a creatinine of 1.1 in February approximately 3 months ago has not had any major changes since then.  For now recommend trial of furosemide.  Want her to write down how often she is using it.  Try not to use it every day and see if she is getting a response to improve the swelling in her feet and extremities.  I would like to see her back in 2 weeks to make sure that she is doing well we will likely need repeat renal function and potassium check at that point in time.  We can also check for proteinuria and other causes of lower extremity edema and liver enzymes etc.   Rash -possibly scabies.  It started right around the time she got  home from the skilled nursing facility in March.  Without examining it it is difficult to say whether or not it could be scabies.  But we discussed just going ahead and doing a treatment and seeing if she improves.  Also consider it could be seborrheic dermatitis or eczema though she is never had this before.  Will treat with permethrin cream for single treatment.  Reminded her to make sure that she applies it over the entire body except for the face and not just her head and scalp which is where most of her symptoms are.  Status post right ankle surgery-still undergoing physical therapy and is now able to walk with a Rollator.  Unfortunately she has had quite a slow recovery.  Right  hip pain-I suspect she has some significant atrophy of the musculature around the hip as well as some arthritis.  Plus her gait is probably different right now status post surgery and it is probably affecting that hip.  If it is not continuing to get better with rehab and PT encouraged her to follow-up with 1 of our sports medicine doctors.   I discussed the assessment and treatment plan with the patient. The patient was provided an opportunity to ask questions and all were answered. The patient agreed with the plan and demonstrated an understanding of the instructions.   The patient was advised to call back or seek an in-person evaluation if the symptoms worsen or if the condition fails to improve as anticipated.  I provided 25 minutes of non-face-to-face time during this encounter.    Beatrice Lecher, MD

## 2019-02-27 NOTE — Telephone Encounter (Signed)
Pt reports she just got home from a rehab center and she believes she has scabies. Requesting oral Rx to help, states she has tried OTC and natural (vinegar) options with no relief. Routing to see if PCP needs to see her prior.

## 2019-02-27 NOTE — Telephone Encounter (Signed)
Let see if we can put her on at the 11:00 slot today for virtual visit.

## 2019-03-01 ENCOUNTER — Ambulatory Visit: Payer: Medicare Other | Admitting: Sports Medicine

## 2019-03-01 ENCOUNTER — Other Ambulatory Visit: Payer: Self-pay

## 2019-03-01 ENCOUNTER — Encounter: Payer: Self-pay | Admitting: Sports Medicine

## 2019-03-01 ENCOUNTER — Ambulatory Visit (INDEPENDENT_AMBULATORY_CARE_PROVIDER_SITE_OTHER): Payer: Medicare Other

## 2019-03-01 DIAGNOSIS — S7001XA Contusion of right hip, initial encounter: Secondary | ICD-10-CM

## 2019-03-01 MED ORDER — OXYCODONE-ACETAMINOPHEN 10-325 MG PO TABS: 1.0000 | ORAL_TABLET | Freq: Three times a day (TID) | ORAL | 0 refills | Status: DC | PRN

## 2019-03-01 NOTE — Progress Notes (Signed)
Subjective:    CC: Fall  HPI: This is a pleasant 78 year old female, she recently had a fall, impacting her left posterior hip, posterior chest wall.  She has bruising, swelling, no shortness of breath, no anterior chest pain, no hemoptysis.  She ambulated in under her own power.  Pain is moderate, persistent, localized without radiation.  I reviewed the past medical history, family history, social history, surgical history, and allergies today and no changes were needed.  Please see the problem list section below in epic for further details.  Past Medical History: Past Medical History:  Diagnosis Date  . Anxiety and depression   . Arthritis   . Chronic fatigue syndrome   . Diabetes mellitus   . DVT (deep venous thrombosis) ([place])    s/p knee replacmet.   . Fibromyalgia   . GERD (gastroesophageal reflux disease)   . Hypercholesteremia   . Hypertension   . Major depressive disorder 03/20/2012  . Obesity   . Peripheral edema   . Sinusitis July 2013   Past Surgical History: Past Surgical History:  Procedure Laterality Date  . ABDOMINAL HYSTERECTOMY    . BACK SURGERY     x 2  . CATARACT EXTRACTION W/ INTRAOCULAR LENS & ANTERIOR VITRECTOMY Left 11/02/26  . CATARACT EXTRACTION W/ INTRAOCULAR LENS IMPLANT Right 09/28/2016  . ORIF ANKLE FRACTURE Right 11/13/2018   [facility]  . REPLACEMENT TOTAL KNEE Bilateral    2 on left and 1 on right, Dr Wynelle Link  . ROTATOR CUFF REPAIR Right   . TONSILLECTOMY     Social History: Social History   Socioeconomic History  . Marital status: Married    Spouse name: Not on file  . Number of children: 5  . Years of education: Not on file  . Highest education level: Not on file  Occupational History  . Occupation: retired  Scientific laboratory technician  . Financial resource strain: Not on file  . Food insecurity:    Worry: Not on file    Inability: Not on file  . Transportation needs:    Medical: Not on file    Non-medical: Not on file  Tobacco Use  .  Smoking status: Never Smoker  . Smokeless tobacco: Never Used  Substance and Sexual Activity  . Alcohol use: No    Comment: Last drink April 2013  . Drug use: No    Comment: Caffeine: Coffee -2-3  cups per day.   Marland Kitchen Sexual activity: Not Currently  Lifestyle  . Physical activity:    Days per week: Not on file    Minutes per session: Not on file  . Stress: Not on file  Relationships  . Social connections:    Talks on phone: Not on file    Gets together: Not on file    Attends religious service: Not on file    Active member of club or organization: Not on file    Attends meetings of clubs or organizations: Not on file    Relationship status: Not on file  Other Topics Concern  . Not on file  Social History Narrative  . Not on file   Family History: Family History  Problem Relation Age of Onset  . Hypertension Mother   . Atrial fibrillation Mother   . Diabetes Mother   . Hyperlipidemia Mother   . Cancer Father        lung  . Diabetes type II Father   . Aneurysm Father        abdominal  .  Depression Daughter   . Colon cancer Neg Hx    Allergies: Allergies  Allergen Reactions  . Losartan Other (See Comments) and Hives    Cough  Cough   . Gabapentin Other (See Comments)    dizziness  . Lisinopril Other (See Comments)    Dry cough  . Oxycodone Nausea And Vomiting  . Oxycodone-Aspirin Nausea And Vomiting   Medications: See med rec.  Review of Systems: No fevers, chills, night sweats, weight loss, chest pain, or shortness of breath.   Objective:    General: Well Developed, well nourished, and in no acute distress.  Neuro: Alert and oriented x3, extra-ocular muscles intact, sensation grossly intact.  HEENT: Normocephalic, atraumatic, pupils equal round reactive to light, neck supple, no masses, no lymphadenopathy, thyroid nonpalpable.  Skin: Warm and dry, no rashes. Cardiac: Regular rate and rhythm, no murmurs rubs or gallops, no lower extremity edema.   Respiratory: Clear to auscultation bilaterally. Not using accessory muscles, speaking in full sentences. Low back: Bruised and swollen over the right quadratus lumborum.  Impression and Recommendations:    Contusion of right hip Pain, contusion of the right lumbar region. We need rib series x-rays, lumbar spine x-rays. Oxycodone for pain. Ice the area for 20 minutes 3-4 times a day. Return to see me in 2 weeks.   ___________________________________________ Gwen Her. Dianah Field, M.D., ABFM., CAQSM. Primary Care and Sports Medicine [facility]  Adjunct Professor of [place] [employer]

## 2019-03-01 NOTE — Assessment & Plan Note (Signed)
Pain, contusion of the right lumbar region. We need rib series x-rays, lumbar spine x-rays. Oxycodone for pain. Ice the area for 20 minutes 3-4 times a day. Return to see me in 2 weeks.

## 2019-03-06 ENCOUNTER — Ambulatory Visit: Payer: Medicare Other | Admitting: Sports Medicine

## 2019-03-06 DIAGNOSIS — S2241XS Multiple fractures of ribs, right side, sequela: Secondary | ICD-10-CM

## 2019-03-06 NOTE — Progress Notes (Signed)
   Thoracic strapped with compressive dressing.

## 2019-03-06 NOTE — Progress Notes (Signed)
Pt presented to office for rib belt application. Rib belt applied without complication and patient expressed immediate relief. Reed Breech paperwork completed. Patient give incentive spirometry and shown how to use it. Patient counciled in importance of taking deep breaths and using spirometry to prevent pneumonia.   No further needs during visit.

## 2019-03-08 NOTE — Telephone Encounter (Signed)
Opened in error

## 2019-03-10 ENCOUNTER — Other Ambulatory Visit: Payer: Self-pay | Admitting: Family Medicine

## 2019-03-10 DIAGNOSIS — I10 Essential (primary) hypertension: Secondary | ICD-10-CM

## 2019-03-11 ENCOUNTER — Other Ambulatory Visit: Payer: Self-pay | Admitting: *Deleted

## 2019-03-14 ENCOUNTER — Other Ambulatory Visit: Payer: Self-pay | Admitting: *Deleted

## 2019-03-14 MED ORDER — METOPROLOL SUCCINATE ER 100 MG PO TB24: ORAL_TABLET | ORAL | 1 refills | Status: DC

## 2019-03-15 ENCOUNTER — Ambulatory Visit: Payer: Medicare Other | Admitting: Sports Medicine

## 2019-03-21 ENCOUNTER — Encounter: Payer: Self-pay | Admitting: Family Medicine

## 2019-03-21 ENCOUNTER — Ambulatory Visit: Payer: Medicare Other | Admitting: Family Medicine

## 2019-03-21 VITALS — BP 138/64 | HR 72 | Ht 67.0 in | Wt 278.0 lb

## 2019-03-21 DIAGNOSIS — R21 Rash and other nonspecific skin eruption: Secondary | ICD-10-CM

## 2019-03-21 DIAGNOSIS — E119 Type 2 diabetes mellitus without complications: Secondary | ICD-10-CM | POA: Diagnosis not present

## 2019-03-21 DIAGNOSIS — I10 Essential (primary) hypertension: Secondary | ICD-10-CM | POA: Diagnosis not present

## 2019-03-21 LAB — POCT GLYCOSYLATED HEMOGLOBIN (HGB A1C): Hemoglobin A1C: 6.6 % — AB (ref 4.0–5.6)

## 2019-03-21 MED ORDER — DOXYCYCLINE HYCLATE 100 MG PO TABS: 100.0000 mg | ORAL_TABLET | Freq: Two times a day (BID) | ORAL | 0 refills | Status: DC

## 2019-03-21 MED ORDER — KETOCONAZOLE 2 % EX SHAM: 1.0000 "application " | MEDICATED_SHAMPOO | CUTANEOUS | 0 refills | Status: DC

## 2019-03-21 MED ORDER — TRIAMCINOLONE ACETONIDE 0.1 % EX CREA: 1.0000 "application " | TOPICAL_CREAM | Freq: Two times a day (BID) | CUTANEOUS | 0 refills | Status: DC | PRN

## 2019-03-21 NOTE — Progress Notes (Signed)
=  She reports that she has scabs all over her scalp and they sometimes go across her forehead. She has used the cream that Dr. Madilyn Fireman gave her but she doesn't feel as this has really helped.Marland KitchenMarland KitchenElouise Lambert, [place]

## 2019-03-21 NOTE — Progress Notes (Signed)
Established Patient Office Visit  Subjective:  Patient ID: Carolyn Lambert, female    DOB: 1940/10/12  Age: 78 y.o. MRN: [id number]  CC:  Chief Complaint  Patient presents with  . Rash    HPI Carolyn Lambert presents for scabs/rash not going away.  We actually did a telephone visit on May 20.  At the time she felt she had scabies she had some itchy sores particularly around her head.  She had been in a skilled nursing facility and had noticed that sometime after coming home.  We decided to do a treatment for possible scabies even though I had not been able to examine her skin closely.  Unfortunately she still has persistent rash and scabs.  Denies any recent changes to shampoos or conditioners etc.  Then more recently she did switch to a tea tree shampoo to see if it would help but she really feels like it has not made a difference.  It still just extremely itchy.  She notices it mostly on her scalp and temple area.  She has not noticed it anywhere else on her body.  Her husband is here with her today for the visit.  Diabetes-so she is here today above to get an up-to-date hemoglobin A1c has been more than 3 months since the last one.  She also has some labs pending from where we did a virtual visit has not had a chance to get those done yet.  Past Medical History:  Diagnosis Date  . Anxiety and depression   . Arthritis   . Chronic fatigue syndrome   . Diabetes mellitus   . DVT (deep venous thrombosis) ([place])    s/p knee replacmet.   . Fibromyalgia   . GERD (gastroesophageal reflux disease)   . Hypercholesteremia   . Hypertension   . Major depressive disorder 03/20/2012  . Obesity   . Peripheral edema   . Sinusitis July 2013    Past Surgical History:  Procedure Laterality Date  . ABDOMINAL HYSTERECTOMY    . BACK SURGERY     x 2  . CATARACT EXTRACTION W/ INTRAOCULAR LENS & ANTERIOR VITRECTOMY Left 11/02/26  . CATARACT EXTRACTION W/ INTRAOCULAR LENS IMPLANT Right  09/28/2016  . ORIF ANKLE FRACTURE Right 11/13/2018   [facility]  . REPLACEMENT TOTAL KNEE Bilateral    2 on left and 1 on right, Dr Wynelle Link  . ROTATOR CUFF REPAIR Right   . TONSILLECTOMY      Family History  Problem Relation Age of Onset  . Hypertension Mother   . Atrial fibrillation Mother   . Diabetes Mother   . Hyperlipidemia Mother   . Cancer Father        lung  . Diabetes type II Father   . Aneurysm Father        abdominal  . Depression Daughter   . Colon cancer Neg Hx     Social History   Socioeconomic History  . Marital status: Married    Spouse name: Not on file  . Number of children: 5  . Years of education: Not on file  . Highest education level: Not on file  Occupational History  . Occupation: retired  Scientific laboratory technician  . Financial resource strain: Not on file  . Food insecurity    Worry: Not on file    Inability: Not on file  . Transportation needs    Medical: Not on file    Non-medical: Not on file  Tobacco Use  . Smoking  status: Never Smoker  . Smokeless tobacco: Never Used  Substance and Sexual Activity  . Alcohol use: No    Comment: Last drink April 2013  . Drug use: No    Comment: Caffeine: Coffee -2-3  cups per day.   Marland Kitchen Sexual activity: Not Currently  Lifestyle  . Physical activity    Days per week: Not on file    Minutes per session: Not on file  . Stress: Not on file  Relationships  . Social Herbalist on phone: Not on file    Gets together: Not on file    Attends religious service: Not on file    Active member of club or organization: Not on file    Attends meetings of clubs or organizations: Not on file    Relationship status: Not on file  . Intimate partner violence    Fear of current or ex partner: Not on file    Emotionally abused: Not on file    Physically abused: Not on file    Forced sexual activity: Not on file  Other Topics Concern  . Not on file  Social History Narrative  . Not on file    Outpatient  Medications Prior to Visit  Medication Sig Dispense Refill  . ACCU-CHEK FASTCLIX LANCETS MISC For testing twice daily. Dx code: E11.9 306 each 4  . AMBULATORY NON FORMULARY MEDICATION Rollator with extra large seat. Fax to [place] [phone] 1 each PRN  . amLODipine (NORVASC) 10 MG tablet TAKE 1 TABLET BY MOUTH  DAILY 90 tablet 1  . aspirin EC 81 MG tablet Take 81 mg by mouth daily.    Marland Kitchen atorvastatin (LIPITOR) 20 MG tablet TAKE 1 TABLET BY MOUTH  DAILY 90 tablet 3  . baclofen (LIORESAL) 10 MG tablet TAKE 1 TABLET BY MOUTH 2  TIMES DAILY AS DIRECTED 180 tablet 1  . Blood Glucose Calibration (ACCU-CHEK AVIVA) SOLN For calibration of glucose meter as needed 1 each prn  . Blood Glucose Monitoring Suppl (ACCU-CHEK GUIDE) w/Device KIT 1 each by Does not apply route 2 (two) times daily. Dx:E11.9 1 kit 0  . buPROPion (WELLBUTRIN XL) 150 MG 24 hr tablet TAKE 1 TABLET BY MOUTH  TWICE DAILY AT 8AM AND 3PM 180 tablet 1  . cetirizine (ZYRTEC) 10 MG tablet Take 10 mg by mouth at bedtime.    . DULoxetine (CYMBALTA) 60 MG capsule TAKE 1 CAPSULE BY MOUTH  DAILY 90 capsule 3  . furosemide (LASIX) 20 MG tablet Take 1 tablet (20 mg total) by mouth daily as needed for edema. 20 tablet 3  . glucose blood (ACCU-CHEK SMARTVIEW) test strip Use to check blood sugars 2 times per day 200 each 3  . metFORMIN (GLUCOPHAGE) 1000 MG tablet TAKE 1 TABLET BY MOUTH 2  TIMES DAILY 180 tablet 1  . metoprolol succinate (TOPROL-XL) 100 MG 24 hr tablet TAKE 1 TABLET BY MOUTH  DAILY WITH OR IMMEDIATELY  FOLLOWING A MEAL 90 tablet 1  . permethrin (ELIMITE) 5 % cream Apply 1 application topically daily.    Marland Kitchen zolpidem (AMBIEN) 5 MG tablet Take 1-1.5 tablets (5-7.5 mg total) by mouth at bedtime as needed for sleep. 45 tablet 1  . AMBULATORY NON FORMULARY MEDICATION Medication Name: Gilford Rile with wheels.  Dx Right ankle fracture s/p ORIF. 1 vial 0  . HYDROcodone-acetaminophen (NORCO/VICODIN) 5-325 MG tablet TK 1 T PO Q 6 H  PRF  PAIN    . oxyCODONE-acetaminophen (PERCOCET) 10-325 MG tablet  Take 1 tablet by mouth every 8 (eight) hours as needed for pain. 15 tablet 0   No facility-administered medications prior to visit.     Allergies  Allergen Reactions  . Losartan Other (See Comments) and Hives    Cough  Cough   . Gabapentin Other (See Comments)    dizziness  . Lisinopril Other (See Comments)    Dry cough  . Oxycodone Nausea And Vomiting  . Oxycodone-Aspirin Nausea And Vomiting    ROS Review of Systems    Objective:    Physical Exam  BP 138/64   Pulse 72   Ht '5\' 7"'  (1.702 m)   Wt 278 lb (126.1 kg)   SpO2 97%   BMI 43.54 kg/m  Wt Readings from Last 3 Encounters:  03/21/19 278 lb (126.1 kg)  03/01/19 274 lb (124.3 kg)  01/10/19 258 lb (117 kg)     There are no preventive care reminders to display for this patient.  There are no preventive care reminders to display for this patient.  Lab Results  Component Value Date   TSH 2.70 05/30/2018   Lab Results  Component Value Date   WBC 10.6 (H) 07/22/2016   HGB 11.5 (L) 07/22/2016   HCT 35.8 (L) 07/22/2016   MCV 92.3 07/22/2016   PLT 208 07/22/2016   Lab Results  Component Value Date   NA 141 11/13/2018   K 4.4 11/13/2018   CO2 28 05/30/2018   GLUCOSE 142 (H) 05/30/2018   BUN 28 (A) 11/13/2018   CREATININE 1.2 (A) 11/13/2018   BILITOT 0.5 05/30/2018   ALKPHOS 81 05/08/2017   AST 10 05/30/2018   ALT 8 05/30/2018   PROT 6.8 05/30/2018   ALBUMIN 4.0 05/08/2017   CALCIUM 9.3 05/30/2018   ANIONGAP 6 07/22/2016   GFR 87.26 07/29/2013   Lab Results  Component Value Date   CHOL 167 05/30/2018   Lab Results  Component Value Date   HDL 59 05/30/2018   Lab Results  Component Value Date   LDLCALC 84 05/30/2018   Lab Results  Component Value Date   TRIG 144 05/30/2018   Lab Results  Component Value Date   CHOLHDL 2.8 05/30/2018   Lab Results  Component Value Date   HGBA1C 6.6 (A) 03/21/2019      Assessment &  Plan:   Problem List Items Addressed This Visit      Endocrine   Diabetes mellitus, type II ([place])   Relevant Orders   POCT glycosylated hemoglobin (Hb A1C) (Completed)    Other Visit Diagnoses    Rash    -  Primary     Rash-unclear etiology.  But since she did not improve with the scabies treatment I am going to treat from a bacterial infection will treat with doxycycline which will also cover staph.  Number to have her use a little bit of triamcinolone cream just to apply to the affected areas when they itch to reduce her scratching.  Right now most of the lesions are just excoriations with small scabs.  Months ago put her on a ketoconazole shampoo twice a week to see if this also helps reduce the itching.  Diabetes-we will go ahead and check A1c today.  Urged her to get her labs when she feels like she is able to.  Hypertension-initial blood pressure was elevated but repeat before departure it came back down to the normal range.  Meds ordered this encounter  Medications  . triamcinolone cream (KENALOG) 0.1 %  Sig: Apply 1 application topically 2 (two) times daily as needed.    Dispense:  30 g    Refill:  0  . doxycycline (VIBRA-TABS) 100 MG tablet    Sig: Take 1 tablet (100 mg total) by mouth 2 (two) times daily.    Dispense:  20 tablet    Refill:  0  . ketoconazole (NIZORAL) 2 % shampoo    Sig: Apply 1 application topically 2 (two) times a week.    Dispense:  120 mL    Refill:  0    Follow-up: Return in about 3 months (around 06/21/2019) for Diabetes follow-up.    Beatrice Lecher, MD

## 2019-05-23 ENCOUNTER — Ambulatory Visit: Payer: Medicare Other | Admitting: Sports Medicine

## 2019-05-30 ENCOUNTER — Ambulatory Visit: Payer: Medicare Other

## 2019-06-06 ENCOUNTER — Other Ambulatory Visit: Payer: Self-pay

## 2019-06-06 ENCOUNTER — Ambulatory Visit: Payer: Medicare Other | Admitting: Sports Medicine

## 2019-06-06 ENCOUNTER — Ambulatory Visit (INDEPENDENT_AMBULATORY_CARE_PROVIDER_SITE_OTHER): Payer: Medicare Other

## 2019-06-06 ENCOUNTER — Encounter: Payer: Self-pay | Admitting: Sports Medicine

## 2019-06-06 DIAGNOSIS — G8929 Other chronic pain: Secondary | ICD-10-CM

## 2019-06-06 DIAGNOSIS — M25511 Pain in right shoulder: Secondary | ICD-10-CM | POA: Insufficient documentation

## 2019-06-06 NOTE — Assessment & Plan Note (Signed)
History of a right rotator cuff repair a decade and a half ago. 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 Right shoulder glenohumeral joint injection today, home health physical therapy, x-rays. Return to see me in 6 weeks.

## 2019-06-06 NOTE — Progress Notes (Signed)
Subjective:    CC: Right shoulder pain  HPI: Right shoulder pain: History of rotator cuff repair back in 2004, now with recurrence of pain, localized in the posterior glenohumeral joint, worse with most movements, reaching forward, overhead activities.  Moderate, persistent, localized without radiation.  I reviewed the past medical history, family history, social history, surgical history, and allergies today and no changes were needed.  Please see the problem list section below in epic for further details.  Past Medical History: Past Medical History:  Diagnosis Date  . Anxiety and depression   . Arthritis   . Chronic fatigue syndrome   . Diabetes mellitus   . DVT (deep venous thrombosis) ([place])    s/p knee replacmet.   . Fibromyalgia   . GERD (gastroesophageal reflux disease)   . Hypercholesteremia   . Hypertension   . Major depressive disorder 03/20/2012  . Obesity   . Peripheral edema   . Sinusitis July 2013   Past Surgical History: Past Surgical History:  Procedure Laterality Date  . ABDOMINAL HYSTERECTOMY    . BACK SURGERY     x 2  . CATARACT EXTRACTION W/ INTRAOCULAR LENS & ANTERIOR VITRECTOMY Left 11/02/26  . CATARACT EXTRACTION W/ INTRAOCULAR LENS IMPLANT Right 09/28/2016  . ORIF ANKLE FRACTURE Right 11/13/2018   [facility]  . REPLACEMENT TOTAL KNEE Bilateral    2 on left and 1 on right, Dr Wynelle Link  . ROTATOR CUFF REPAIR Right   . TONSILLECTOMY     Social History: Social History   Socioeconomic History  . Marital status: Married    Spouse name: Not on file  . Number of children: 5  . Years of education: Not on file  . Highest education level: Not on file  Occupational History  . Occupation: retired  Scientific laboratory technician  . Financial resource strain: Not on file  . Food insecurity    Worry: Not on file    Inability: Not on file  . Transportation needs    Medical: Not on file    Non-medical: Not on file  Tobacco Use  . Smoking status: Never Smoker  . Smokeless  tobacco: Never Used  Substance and Sexual Activity  . Alcohol use: No    Comment: Last drink April 2013  . Drug use: No    Comment: Caffeine: Coffee -2-3  cups per day.   Marland Kitchen Sexual activity: Not Currently  Lifestyle  . Physical activity    Days per week: Not on file    Minutes per session: Not on file  . Stress: Not on file  Relationships  . Social Herbalist on phone: Not on file    Gets together: Not on file    Attends religious service: Not on file    Active member of club or organization: Not on file    Attends meetings of clubs or organizations: Not on file    Relationship status: Not on file  Other Topics Concern  . Not on file  Social History Narrative  . Not on file   Family History: Family History  Problem Relation Age of Onset  . Hypertension Mother   . Atrial fibrillation Mother   . Diabetes Mother   . Hyperlipidemia Mother   . Cancer Father        lung  . Diabetes type II Father   . Aneurysm Father        abdominal  . Depression Daughter   . Colon cancer Neg Hx  Allergies: Allergies  Allergen Reactions  . Losartan Other (See Comments) and Hives    Cough  Cough   . Gabapentin Other (See Comments)    dizziness  . Lisinopril Other (See Comments)    Dry cough  . Oxycodone Nausea And Vomiting  . Oxycodone-Aspirin Nausea And Vomiting   Medications: See med rec.  Review of Systems: No fevers, chills, night sweats, weight loss, chest pain, or shortness of breath.   Objective:    General: Well Developed, well nourished, and in no acute distress.  Neuro: Alert and oriented x3, extra-ocular muscles intact, sensation grossly intact.  HEENT: Normocephalic, atraumatic, pupils equal round reactive to light, neck supple, no masses, no lymphadenopathy, thyroid nonpalpable.  Skin: Warm and dry, no rashes. Cardiac: Regular rate and rhythm, no murmurs rubs or gallops, no lower extremity edema.  Respiratory: Clear to auscultation bilaterally. Not  using accessory muscles, speaking in full sentences. Right shoulder: Positive Neer's test, Hawkins test, weak empty can, positive speeds test, positive crank test.  Procedure: Real-time Ultrasound Guided injection of the right glenohumeral joint Device: GE Logiq E  Verbal informed consent obtained.  Time-out conducted.  Noted no overlying erythema, induration, or other signs of local infection.  Skin prepped in a sterile fashion.  Local anesthesia: Topical Ethyl chloride.  With sterile technique and under real time ultrasound guidance:  Noted glenohumeral degenerative changes, 1 cc Kenalog 40, 2 cc lidocaine, 2 cc bupivacaine injected easily Completed without difficulty  Pain immediately resolved suggesting accurate placement of the medication.  Advised to call if fevers/chills, erythema, induration, drainage, or persistent bleeding.  Images permanently stored and available for review in the ultrasound unit.  Impression: Technically successful ultrasound guided injection.  Impression and Recommendations:    Right shoulder pain History of a right rotator cuff repair a decade and a half ago. 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 Right shoulder glenohumeral joint injection today, home health physical therapy, x-rays. Return to see me in 6 weeks.   ___________________________________________ Gwen Her. Dianah Field, M.D., ABFM., CAQSM. Primary Care and Sports Medicine [facility]  Adjunct Professor of [place] [employer]

## 2019-06-18 ENCOUNTER — Telehealth: Payer: Self-pay | Admitting: *Deleted

## 2019-06-18 NOTE — Telephone Encounter (Signed)
Nynika from [facility] would like verbal orders to continue PT once a week for 2 weeks, then twice a week for 2 weeks.

## 2019-06-18 NOTE — Telephone Encounter (Signed)
Verbal orders given  

## 2019-06-18 NOTE — Telephone Encounter (Signed)
Orders given to [facility].

## 2019-06-21 ENCOUNTER — Ambulatory Visit: Payer: Medicare Other | Admitting: Family Medicine

## 2019-06-21 ENCOUNTER — Other Ambulatory Visit: Payer: Self-pay | Admitting: Family Medicine

## 2019-06-21 DIAGNOSIS — F5101 Primary insomnia: Secondary | ICD-10-CM

## 2019-06-26 ENCOUNTER — Ambulatory Visit (INDEPENDENT_AMBULATORY_CARE_PROVIDER_SITE_OTHER): Payer: Medicare Other | Admitting: Family Medicine

## 2019-06-26 ENCOUNTER — Encounter: Payer: Self-pay | Admitting: Family Medicine

## 2019-06-26 DIAGNOSIS — E119 Type 2 diabetes mellitus without complications: Secondary | ICD-10-CM

## 2019-06-26 DIAGNOSIS — I10 Essential (primary) hypertension: Secondary | ICD-10-CM | POA: Diagnosis not present

## 2019-06-26 DIAGNOSIS — F332 Major depressive disorder, recurrent severe without psychotic features: Secondary | ICD-10-CM | POA: Diagnosis not present

## 2019-06-26 NOTE — Assessment & Plan Note (Signed)
Plan will be to continue to monitor her blood pressures in a couple of weeks as she goes to physical therapy.  It sounds like she has had some normal pressures but then she is had some elevated pressures.  Asked if she thought pain could be a contributor she did not think so.  So again I want keep an eye on it before adjusting her regimen.

## 2019-06-26 NOTE — Progress Notes (Signed)
Virtual Visit via Video Note  I connected with Carolyn Lambert on 06/26/19 at 10:50 AM EDT by a video enabled telemedicine application and verified that I am speaking with the correct person using two identifiers.   I discussed the limitations of evaluation and management by telemedicine and the availability of in person appointments. The patient expressed understanding and agreed to proceed.     Established Patient Office Visit  Subjective:  Patient ID: Carolyn Lambert, female    DOB: 30-Aug-1941  Age: 78 y.o. MRN: [id number]  CC:  Chief Complaint  Patient presents with  . Diabetes    HPI Carolyn Lambert presents for   Diabetes - no hypoglycemic events. No wounds or sores that are not healing well. No increased thirst or urination. Checking glucose at home. Taking medications as prescribed without any side effects.  Having problems with a rash on her scalp.  She said she did finally go see dermatology and they gave her the same shampoo that I had given her and switch her to a topical steroid oil instead of the cream.  Plus they gave her something oral she does not know exactly what it is.  She just finished the oral medicine yesterday she says that she does not feel like it significantly better but may be a little bit better.  She does have a follow-up in a few weeks with dermatology  Hypertension follow-up-she says her blood pressures have been elevated more recently since she started physical therapy she says she has had some blood pressures that are good like in the 120s over 70s and then some blood pressures that are in the 150s over 90s.  She does not think it is pain related but wanted to bring that to my attention.  She is currently on 10 mg of amlodipine, metoprolol 100 mg, and furosemide 20 mg as needed.  Past Medical History:  Diagnosis Date  . Anxiety and depression   . Arthritis   . Chronic fatigue syndrome   . Diabetes mellitus   . DVT (deep venous  thrombosis) ([place])    s/p knee replacmet.   . Fibromyalgia   . GERD (gastroesophageal reflux disease)   . Hypercholesteremia   . Hypertension   . Major depressive disorder 03/20/2012  . Obesity   . Peripheral edema   . Sinusitis July 2013    Past Surgical History:  Procedure Laterality Date  . ABDOMINAL HYSTERECTOMY    . BACK SURGERY     x 2  . CATARACT EXTRACTION W/ INTRAOCULAR LENS & ANTERIOR VITRECTOMY Left 11/02/26  . CATARACT EXTRACTION W/ INTRAOCULAR LENS IMPLANT Right 09/28/2016  . ORIF ANKLE FRACTURE Right 11/13/2018   [facility]  . REPLACEMENT TOTAL KNEE Bilateral    2 on left and 1 on right, Dr Wynelle Link  . ROTATOR CUFF REPAIR Right   . TONSILLECTOMY      Family History  Problem Relation Age of Onset  . Hypertension Mother   . Atrial fibrillation Mother   . Diabetes Mother   . Hyperlipidemia Mother   . Cancer Father        lung  . Diabetes type II Father   . Aneurysm Father        abdominal  . Depression Daughter   . Colon cancer Neg Hx     Social History   Socioeconomic History  . Marital status: Married    Spouse name: Not on file  . Number of children: 5  . Years of education:  Not on file  . Highest education level: Not on file  Occupational History  . Occupation: retired  Scientific laboratory technician  . Financial resource strain: Not on file  . Food insecurity    Worry: Not on file    Inability: Not on file  . Transportation needs    Medical: Not on file    Non-medical: Not on file  Tobacco Use  . Smoking status: Never Smoker  . Smokeless tobacco: Never Used  Substance and Sexual Activity  . Alcohol use: No    Comment: Last drink April 2013  . Drug use: No    Comment: Caffeine: Coffee -2-3  cups per day.   Marland Kitchen Sexual activity: Not Currently  Lifestyle  . Physical activity    Days per week: Not on file    Minutes per session: Not on file  . Stress: Not on file  Relationships  . Social Herbalist on phone: Not on file    Gets together: Not on  file    Attends religious service: Not on file    Active member of club or organization: Not on file    Attends meetings of clubs or organizations: Not on file    Relationship status: Not on file  . Intimate partner violence    Fear of current or ex partner: Not on file    Emotionally abused: Not on file    Physically abused: Not on file    Forced sexual activity: Not on file  Other Topics Concern  . Not on file  Social History Narrative  . Not on file    Outpatient Medications Prior to Visit  Medication Sig Dispense Refill  . AMBULATORY NON FORMULARY MEDICATION Rollator with extra large seat. Fax to [place] [phone] 1 each PRN  . amLODipine (NORVASC) 10 MG tablet TAKE 1 TABLET BY MOUTH  DAILY 90 tablet 1  . aspirin EC 81 MG tablet Take 81 mg by mouth daily.    Marland Kitchen atorvastatin (LIPITOR) 20 MG tablet TAKE 1 TABLET BY MOUTH  DAILY 90 tablet 3  . baclofen (LIORESAL) 10 MG tablet TAKE 1 TABLET BY MOUTH 2  TIMES DAILY AS DIRECTED 180 tablet 1  . Blood Glucose Monitoring Suppl (ACCU-CHEK GUIDE) w/Device KIT 1 each by Does not apply route 2 (two) times daily. Dx:E11.9 1 kit 0  . buPROPion (WELLBUTRIN XL) 150 MG 24 hr tablet TAKE 1 TABLET BY MOUTH  TWICE DAILY AT 8AM AND 3PM 180 tablet 1  . cetirizine (ZYRTEC) 10 MG tablet Take 10 mg by mouth at bedtime.    . DULoxetine (CYMBALTA) 60 MG capsule TAKE 1 CAPSULE BY MOUTH  DAILY 90 capsule 3  . furosemide (LASIX) 20 MG tablet Take 1 tablet (20 mg total) by mouth daily as needed for edema. 20 tablet 3  . glucose blood (ACCU-CHEK SMARTVIEW) test strip Use to check blood sugars 2 times per day 200 each 3  . ketoconazole (NIZORAL) 2 % shampoo Apply 1 application topically 2 (two) times a week. 120 mL 0  . metFORMIN (GLUCOPHAGE) 1000 MG tablet TAKE 1 TABLET BY MOUTH 2  TIMES DAILY 180 tablet 1  . metoprolol succinate (TOPROL-XL) 100 MG 24 hr tablet TAKE 1 TABLET BY MOUTH  DAILY WITH OR IMMEDIATELY  FOLLOWING A MEAL 90 tablet 1  .  permethrin (ELIMITE) 5 % cream Apply 1 application topically daily.    Marland Kitchen triamcinolone cream (KENALOG) 0.1 % Apply 1 application topically 2 (two) times daily  as needed. 30 g 0  . zolpidem (AMBIEN) 5 MG tablet TAKE 1 TO 1 AND 1/2 TABLETS BY MOUTH AT BEDTIME AS NEEDED FOR SLEEP 45 tablet 0  . ACCU-CHEK FASTCLIX LANCETS MISC For testing twice daily. Dx code: E11.9 306 each 4  . Blood Glucose Calibration (ACCU-CHEK AVIVA) SOLN For calibration of glucose meter as needed 1 each prn  . doxycycline (VIBRA-TABS) 100 MG tablet Take 1 tablet (100 mg total) by mouth 2 (two) times daily. 20 tablet 0   No facility-administered medications prior to visit.     Allergies  Allergen Reactions  . Losartan Other (See Comments) and Hives    Cough  Cough   . Gabapentin Other (See Comments)    dizziness  . Lisinopril Other (See Comments)    Dry cough  . Oxycodone Nausea And Vomiting  . Oxycodone-Aspirin Nausea And Vomiting    ROS Review of Systems    Objective:    Physical Exam  Constitutional: She is oriented to person, place, and time.  Pulmonary/Chest: Effort normal.  Neurological: She is alert and oriented to person, place, and time.  Psychiatric: She has a normal mood and affect. Her behavior is normal.    There were no vitals taken for this visit. Wt Readings from Last 3 Encounters:  06/06/19 280 lb (127 kg)  03/21/19 278 lb (126.1 kg)  03/01/19 274 lb (124.3 kg)     Health Maintenance Due  Topic Date Due  . TETANUS/TDAP  03/26/2018    There are no preventive care reminders to display for this patient.  Lab Results  Component Value Date   TSH 2.70 05/30/2018   Lab Results  Component Value Date   WBC 10.6 (H) 07/22/2016   HGB 11.5 (L) 07/22/2016   HCT 35.8 (L) 07/22/2016   MCV 92.3 07/22/2016   PLT 208 07/22/2016   Lab Results  Component Value Date   NA 141 11/13/2018   K 4.4 11/13/2018   CO2 28 05/30/2018   GLUCOSE 142 (H) 05/30/2018   BUN 28 (A) 11/13/2018    CREATININE 1.2 (A) 11/13/2018   BILITOT 0.5 05/30/2018   ALKPHOS 81 05/08/2017   AST 10 05/30/2018   ALT 8 05/30/2018   PROT 6.8 05/30/2018   ALBUMIN 4.0 05/08/2017   CALCIUM 9.3 05/30/2018   ANIONGAP 6 07/22/2016   GFR 87.26 07/29/2013   Lab Results  Component Value Date   CHOL 167 05/30/2018   Lab Results  Component Value Date   HDL 59 05/30/2018   Lab Results  Component Value Date   LDLCALC 84 05/30/2018   Lab Results  Component Value Date   TRIG 144 05/30/2018   Lab Results  Component Value Date   CHOLHDL 2.8 05/30/2018   Lab Results  Component Value Date   HGBA1C 6.6 (A) 03/21/2019      Assessment & Plan:   Problem List Items Addressed This Visit      Cardiovascular and Mediastinum   Essential hypertension    Plan will be to continue to monitor her blood pressures in a couple of weeks as she goes to physical therapy.  It sounds like she has had some normal pressures but then she is had some elevated pressures.  Asked if she thought pain could be a contributor she did not think so.  So again I want keep an eye on it before adjusting her regimen.      Relevant Orders   COMPLETE METABOLIC PANEL WITH GFR   Lipid  panel   CBC     Endocrine   Diabetes mellitus, type II ([place]) - Primary    She has not had any major changes in her blood sugar levels.  Due for A1c she will try to go to the lab either later this week or early next week.      Relevant Orders   COMPLETE METABOLIC PANEL WITH GFR   Lipid panel   CBC     Other   MDD (major depressive disorder), recurrent episode, Lambert ([place])    She feels like her mood is okay.  It is been a little bit stressful with [place] and not being able to get out and go like she is used to doing but overall feels like she is doing okay.         I discussed the assessment and treatment plan with the patient. The patient was provided an opportunity to ask questions and all were answered. The patient agreed with the plan  and demonstrated an understanding of the instructions.   The patient was advised to call back or seek an in-person evaluation if the symptoms worsen or if the condition fails to improve as anticipated.  No orders of the defined types were placed in this encounter.   Follow-up: No follow-ups on file.    Beatrice Lecher, MD

## 2019-06-26 NOTE — Assessment & Plan Note (Signed)
She has not had any major changes in her blood sugar levels.  Due for A1c she will try to go to the lab either later this week or early next week.

## 2019-06-26 NOTE — Progress Notes (Signed)
She stated that she still has issues w/her scalp and has been seen by derm she has a f/u appt 07/24/2019. She was given some oil and more shampoo,  She is doing PT again and stated that this is going ok.   Maryruth Eve, [place] Crocker, CMA

## 2019-06-26 NOTE — Assessment & Plan Note (Signed)
She feels like her mood is okay.  It is been a little bit stressful with [place] and not being able to get out and go like she is used to doing but overall feels like she is doing okay.

## 2019-07-18 ENCOUNTER — Ambulatory Visit: Payer: Medicare Other | Admitting: Sports Medicine

## 2019-08-07 ENCOUNTER — Telehealth: Payer: Self-pay | Admitting: Family Medicine

## 2019-08-07 ENCOUNTER — Ambulatory Visit: Payer: Medicare Other

## 2019-08-07 NOTE — Telephone Encounter (Signed)
Please call pt: since her fracture has anyone started her on medication for her bones to prevent future fractures like fosamax of Boniva or the Prolia shot?

## 2019-08-08 MED ORDER — ALENDRONATE SODIUM 70 MG PO TABS: 70.0000 mg | ORAL_TABLET | ORAL | 3 refills | Status: DC

## 2019-08-08 NOTE — Telephone Encounter (Signed)
Patient states she has not been started on any medication but is interested and willing to start.   Patient has medicare wellness with Maudie Mercury next week on 11/3, wants to know if Maudie Mercury can discuss and help her decide on what she would like at that time? Please advise

## 2019-08-08 NOTE — Telephone Encounter (Signed)
OK, I will send over fosamax. It is a pill taken once a week. Sent to [facility]. If causes heartburn let us know. Make sure to sit up after take medication. Don't take and then lay down.

## 2019-08-09 NOTE — Telephone Encounter (Signed)
lvm advising pt of recommendations.Marland KitchenMarland KitchenElouise Munroe, [place]

## 2019-08-13 ENCOUNTER — Ambulatory Visit: Payer: Medicare Other

## 2019-08-13 NOTE — Progress Notes (Deleted)
Subjective:   Carolyn Lambert is a 78 y.o. female who presents for Medicare Annual (Subsequent) preventive examination.  Review of Systems:  No ROS.  Medicare Wellness Virtual Visit.  Visual/audio telehealth visit, UTA vital signs.   See social history for additional risk factors.      Sleep patterns:    Home Safety/Smoke Alarms: Feels safe in home. Smoke alarms in place.  Living environment; Seat Belt Safety/Bike Helmet: Wears seat belt.   Female:   Pap- Aged out       Mammo- Aged out      Dexa scan-        CCS- Aged out     Objective:     Vitals: There were no vitals taken for this visit.  There is no height or weight on file to calculate BMI.  Advanced Directives 07/23/2016 07/22/2016  Does Patient Have a Medical Advance Directive? Yes Yes  Type of Advance Directive Healthcare Power of [place]  Does patient want to make changes to medical advance directive? No - Patient declined -  Copy of [place] in Chart? No - copy requested -    Tobacco Social History   Tobacco Use  Smoking Status Never Smoker  Smokeless Tobacco Never Used     Counseling given: Not Answered   Clinical Intake:                       Past Medical History:  Diagnosis Date  . Anxiety and depression   . Arthritis   . Chronic fatigue syndrome   . Diabetes mellitus   . DVT (deep venous thrombosis) ([place])    s/p knee replacmet.   . Fibromyalgia   . GERD (gastroesophageal reflux disease)   . Hypercholesteremia   . Hypertension   . Major depressive disorder 03/20/2012  . Obesity   . Peripheral edema   . Sinusitis July 2013   Past Surgical History:  Procedure Laterality Date  . ABDOMINAL HYSTERECTOMY    . BACK SURGERY     x 2  . CATARACT EXTRACTION W/ INTRAOCULAR LENS & ANTERIOR VITRECTOMY Left 11/02/26  . CATARACT EXTRACTION W/ INTRAOCULAR LENS IMPLANT Right 09/28/2016  . ORIF ANKLE FRACTURE Right 11/13/2018   [facility]  . REPLACEMENT TOTAL KNEE Bilateral    2 on left and 1 on right, Dr Wynelle Link  . ROTATOR CUFF REPAIR Right   . TONSILLECTOMY     Family History  Problem Relation Age of Onset  . Hypertension Mother   . Atrial fibrillation Mother   . Diabetes Mother   . Hyperlipidemia Mother   . Cancer Father        lung  . Diabetes type II Father   . Aneurysm Father        abdominal  . Depression Daughter   . Colon cancer Neg Hx    Social History   Socioeconomic History  . Marital status: Married    Spouse name: Not on file  . Number of children: 5  . Years of education: Not on file  . Highest education level: Not on file  Occupational History  . Occupation: retired  Scientific laboratory technician  . Financial resource strain: Not on file  . Food insecurity    Worry: Not on file    Inability: Not on file  . Transportation needs    Medical: Not on file    Non-medical: Not on file  Tobacco Use  . Smoking status:  Never Smoker  . Smokeless tobacco: Never Used  Substance and Sexual Activity  . Alcohol use: No    Comment: Last drink April 2013  . Drug use: No    Comment: Caffeine: Coffee -2-3  cups per day.   Marland Kitchen Sexual activity: Not Currently  Lifestyle  . Physical activity    Days per week: Not on file    Minutes per session: Not on file  . Stress: Not on file  Relationships  . Social Herbalist on phone: Not on file    Gets together: Not on file    Attends religious service: Not on file    Active member of club or organization: Not on file    Attends meetings of clubs or organizations: Not on file    Relationship status: Not on file  Other Topics Concern  . Not on file  Social History Narrative  . Not on file    Outpatient Encounter Medications as of 08/13/2019  Medication Sig  . alendronate (FOSAMAX) 70 MG tablet Take 1 tablet (70 mg total) by mouth once a week. Take with a full glass of water on an empty stomach.  . AMBULATORY NON FORMULARY MEDICATION Rollator with extra  large seat. Fax to [place] [phone]  . amLODipine (NORVASC) 10 MG tablet TAKE 1 TABLET BY MOUTH  DAILY  . aspirin EC 81 MG tablet Take 81 mg by mouth daily.  Marland Kitchen atorvastatin (LIPITOR) 20 MG tablet TAKE 1 TABLET BY MOUTH  DAILY  . baclofen (LIORESAL) 10 MG tablet TAKE 1 TABLET BY MOUTH 2  TIMES DAILY AS DIRECTED  . Blood Glucose Monitoring Suppl (ACCU-CHEK GUIDE) w/Device KIT 1 each by Does not apply route 2 (two) times daily. Dx:E11.9  . buPROPion (WELLBUTRIN XL) 150 MG 24 hr tablet TAKE 1 TABLET BY MOUTH  TWICE DAILY AT 8AM AND 3PM  . cetirizine (ZYRTEC) 10 MG tablet Take 10 mg by mouth at bedtime.  . DULoxetine (CYMBALTA) 60 MG capsule TAKE 1 CAPSULE BY MOUTH  DAILY  . furosemide (LASIX) 20 MG tablet Take 1 tablet (20 mg total) by mouth daily as needed for edema.  Marland Kitchen glucose blood (ACCU-CHEK SMARTVIEW) test strip Use to check blood sugars 2 times per day  . ketoconazole (NIZORAL) 2 % shampoo Apply 1 application topically 2 (two) times a week.  . metFORMIN (GLUCOPHAGE) 1000 MG tablet TAKE 1 TABLET BY MOUTH 2  TIMES DAILY  . metoprolol succinate (TOPROL-XL) 100 MG 24 hr tablet TAKE 1 TABLET BY MOUTH  DAILY WITH OR IMMEDIATELY  FOLLOWING A MEAL  . permethrin (ELIMITE) 5 % cream Apply 1 application topically daily.  Marland Kitchen triamcinolone cream (KENALOG) 0.1 % Apply 1 application topically 2 (two) times daily as needed.  . zolpidem (AMBIEN) 5 MG tablet TAKE 1 TO 1 AND 1/2 TABLETS BY MOUTH AT BEDTIME AS NEEDED FOR SLEEP   No facility-administered encounter medications on file as of 08/13/2019.     Activities of Daily Living No flowsheet data found.  Patient Care Team: Hali Marry, MD as PCP - General (Family Medicine)    Assessment:   This is a routine wellness examination for [place].Physical assessment deferred to PCP.   Exercise Activities and Dietary recommendations   Diet  Breakfast: Lunch:  Dinner:       Goals   None     Fall Risk Fall Risk  01/10/2019  05/17/2018 04/27/2017 04/25/2016 08/18/2015  Falls in the past year? 1 Yes No Yes  No  Number falls in past yr: 1 1 - 2 or more -  Injury with Fall? 1 No - No -  Risk Factor Category  - - - High Fall Risk -  Risk for fall due to : History of fall(s);Impaired balance/gait Impaired mobility - - Impaired balance/gait  Follow up Falls prevention discussed Falls prevention discussed - Education provided;Falls prevention discussed -   Is the patient's home free of loose throw rugs in walkways, pet beds, electrical cords, etc?   {Blank single:19197::"yes","no"}      Grab bars in the bathroom? {Blank single:19197::"yes","no"}      Handrails on the stairs?   {Blank single:19197::"yes","no"}      Adequate lighting?   {Blank single:19197::"yes","no"}  Depression Screen PHQ 2/9 Scores 06/26/2019 02/27/2019 01/10/2019 05/17/2018  PHQ - 2 Score '2 1 3 2  ' PHQ- 9 Score '10 6 11 5     ' Cognitive Function     6CIT Screen 04/27/2017  What Year? 0 points  What month? 0 points  What time? 0 points  Count back from 20 0 points  Months in reverse 0 points  Repeat phrase 0 points  Total Score 0    Immunization History  Administered Date(s) Administered  . Influenza, High Dose Seasonal PF 07/11/2016, 07/25/2017, 09/13/2018  . Influenza,inj,Quad PF,6+ Mos 06/17/2014, 06/02/2015  . Influenza-Unspecified 08/10/2013  . Pneumococcal Conjugate-13 08/18/2015  . Pneumococcal Polysaccharide-23 08/02/2008, 11/16/2018  . Td 03/26/2008    Screening Tests Health Maintenance  Topic Date Due  . OPHTHALMOLOGY EXAM  12/16/2017  . TETANUS/TDAP  03/26/2018  . URINE MICROALBUMIN  07/25/2018  . INFLUENZA VACCINE  10/10/2019 (Originally 05/11/2019)  . FOOT EXAM  10/10/2019 (Originally 05/18/2019)  . HEMOGLOBIN A1C  09/20/2019  . DEXA SCAN  Completed  . PNA vac Low Risk Adult  Completed        Plan:   ***   I have personally reviewed and noted the following in the patient's chart:   . Medical and social history . Use of  alcohol, tobacco or illicit drugs  . Current medications and supplements . Functional ability and status . Nutritional status . Physical activity . Advanced directives . List of other physicians . Hospitalizations, surgeries, and ER visits in previous 12 months . Vitals . Screenings to include cognitive, depression, and falls . Referrals and appointments  In addition, I have reviewed and discussed with patient certain preventive protocols, quality metrics, and best practice recommendations. A written personalized care plan for preventive services as well as general preventive health recommendations were provided to patient.     Joanne Chars, LPN  18/01/8591

## 2019-09-07 ENCOUNTER — Other Ambulatory Visit: Payer: Self-pay | Admitting: Family Medicine

## 2019-09-15 ENCOUNTER — Other Ambulatory Visit: Payer: Self-pay | Admitting: Family Medicine

## 2019-09-26 ENCOUNTER — Ambulatory Visit: Payer: Medicare Other | Admitting: Family Medicine

## 2019-09-27 ENCOUNTER — Other Ambulatory Visit: Payer: Self-pay | Admitting: Family Medicine

## 2019-09-29 ENCOUNTER — Other Ambulatory Visit: Payer: Self-pay | Admitting: Family Medicine

## 2019-09-29 DIAGNOSIS — I10 Essential (primary) hypertension: Secondary | ICD-10-CM

## 2019-10-08 NOTE — Progress Notes (Deleted)
Subjective:   Carolyn Lambert is a 78 y.o. female who presents for Medicare Annual (Subsequent) preventive examination.  Review of Systems:  No ROS.  Medicare Wellness Virtual Visit.  Visual/audio telehealth visit, UTA vital signs.   See social history for additional risk factors.      Sleep patterns:  Home Safety/Smoke Alarms: Feels safe in home. Smoke alarms in place.  Living environment; Seat Belt Safety/Bike Helmet: Wears seat belt.   Female:   Pap- Aged out       Mammo- UTD      Dexa scan-        CCS- Aged out     Objective:     Vitals: There were no vitals taken for this visit.  There is no height or weight on file to calculate BMI.  Advanced Directives 07/23/2016 07/22/2016  Does Patient Have a Medical Advance Directive? Yes Yes  Type of Advance Directive Healthcare Power of [place]  Does patient want to make changes to medical advance directive? No - Patient declined -  Copy of [place] in Chart? No - copy requested -    Tobacco Social History   Tobacco Use  Smoking Status Never Smoker  Smokeless Tobacco Never Used     Counseling given: Not Answered   Clinical Intake:                       Past Medical History:  Diagnosis Date  . Anxiety and depression   . Arthritis   . Chronic fatigue syndrome   . Diabetes mellitus   . DVT (deep venous thrombosis) ([place])    s/p knee replacmet.   . Fibromyalgia   . GERD (gastroesophageal reflux disease)   . Hypercholesteremia   . Hypertension   . Major depressive disorder 03/20/2012  . Obesity   . Peripheral edema   . Sinusitis July 2013   Past Surgical History:  Procedure Laterality Date  . ABDOMINAL HYSTERECTOMY    . BACK SURGERY     x 2  . CATARACT EXTRACTION W/ INTRAOCULAR LENS & ANTERIOR VITRECTOMY Left 11/02/26  . CATARACT EXTRACTION W/ INTRAOCULAR LENS IMPLANT Right 09/28/2016  . ORIF ANKLE FRACTURE Right 11/13/2018   [facility]  .  REPLACEMENT TOTAL KNEE Bilateral    2 on left and 1 on right, Dr Wynelle Link  . ROTATOR CUFF REPAIR Right   . TONSILLECTOMY     Family History  Problem Relation Age of Onset  . Hypertension Mother   . Atrial fibrillation Mother   . Diabetes Mother   . Hyperlipidemia Mother   . Cancer Father        lung  . Diabetes type II Father   . Aneurysm Father        abdominal  . Depression Daughter   . Colon cancer Neg Hx    Social History   Socioeconomic History  . Marital status: Married    Spouse name: Not on file  . Number of children: 5  . Years of education: Not on file  . Highest education level: Not on file  Occupational History  . Occupation: retired  Tobacco Use  . Smoking status: Never Smoker  . Smokeless tobacco: Never Used  Substance and Sexual Activity  . Alcohol use: No    Comment: Last drink April 2013  . Drug use: No    Comment: Caffeine: Coffee -2-3  cups per day.   Marland Kitchen Sexual activity: Not Currently  Other Topics Concern  . Not on file  Social History Narrative  . Not on file   Social Determinants of Health   Financial Resource Strain:   . Difficulty of Paying Living Expenses: Not on file  Food Insecurity:   . Worried About Charity fundraiser in the Last Year: Not on file  . Ran Out of Food in the Last Year: Not on file  Transportation Needs:   . Lack of Transportation (Medical): Not on file  . Lack of Transportation (Non-Medical): Not on file  Physical Activity:   . Days of Exercise per Week: Not on file  . Minutes of Exercise per Session: Not on file  Stress:   . Feeling of Stress : Not on file  Social Connections:   . Frequency of Communication with Friends and Family: Not on file  . Frequency of Social Gatherings with Friends and Family: Not on file  . Attends Religious Services: Not on file  . Active Member of Clubs or Organizations: Not on file  . Attends Archivist Meetings: Not on file  . Marital Status: Not on file    Outpatient  Encounter Medications as of 10/09/2019  Medication Sig  . alendronate (FOSAMAX) 70 MG tablet Take 1 tablet (70 mg total) by mouth once a week. Take with a full glass of water on an empty stomach.  . AMBULATORY NON FORMULARY MEDICATION Rollator with extra large seat. Fax to [facility] [phone]  . amLODipine (NORVASC) 10 MG tablet TAKE 1 TABLET BY MOUTH  DAILY  . aspirin EC 81 MG tablet Take 81 mg by mouth daily.  Marland Kitchen atorvastatin (LIPITOR) 20 MG tablet TAKE 1 TABLET BY MOUTH  DAILY  . baclofen (LIORESAL) 10 MG tablet TAKE 1 TABLET BY MOUTH 2  TIMES DAILY AS DIRECTED  . Blood Glucose Monitoring Suppl (ACCU-CHEK GUIDE) w/Device KIT 1 each by Does not apply route 2 (two) times daily. Dx:E11.9  . buPROPion (WELLBUTRIN XL) 150 MG 24 hr tablet TAKE 1 TABLET BY MOUTH  TWICE DAILY AT 8AM AND 3PM  . cetirizine (ZYRTEC) 10 MG tablet Take 10 mg by mouth at bedtime.  . DULoxetine (CYMBALTA) 60 MG capsule TAKE 1 CAPSULE BY MOUTH  DAILY  . furosemide (LASIX) 20 MG tablet Take 1 tablet (20 mg total) by mouth daily as needed for edema.  Marland Kitchen glucose blood (ACCU-CHEK SMARTVIEW) test strip Use to check blood sugars 2 times per day  . ketoconazole (NIZORAL) 2 % shampoo Apply 1 application topically 2 (two) times a week.  . metFORMIN (GLUCOPHAGE) 1000 MG tablet TAKE 1 TABLET BY MOUTH  TWICE DAILY  . metoprolol succinate (TOPROL-XL) 100 MG 24 hr tablet TAKE 1 TABLET BY MOUTH  DAILY WITH OR IMMEDIATELY  FOLLOWING A MEAL  . permethrin (ELIMITE) 5 % cream Apply 1 application topically daily.  Marland Kitchen triamcinolone cream (KENALOG) 0.1 % Apply 1 application topically 2 (two) times daily as needed.  . zolpidem (AMBIEN) 5 MG tablet TAKE 1 TO 1 AND 1/2 TABLETS BY MOUTH AT BEDTIME AS NEEDED FOR SLEEP   No facility-administered encounter medications on file as of 10/09/2019.    Activities of Daily Living No flowsheet data found.  Patient Care Team: Hali Marry, MD as PCP - General (Family Medicine)     Assessment:   This is a routine wellness examination for [place].Physical assessment deferred to PCP.   Exercise Activities and Dietary recommendations   Diet  Breakfast: Lunch:  Dinner:  Goals   None     Fall Risk Fall Risk  01/10/2019 05/17/2018 04/27/2017 04/25/2016 08/18/2015  Falls in the past year? 1 Yes No Yes No  Number falls in past yr: 1 1 - 2 or more -  Injury with Fall? 1 No - No -  Risk Factor Category  - - - High Fall Risk -  Risk for fall due to : History of fall(s);Impaired balance/gait Impaired mobility - - Impaired balance/gait  Follow up Falls prevention discussed Falls prevention discussed - Education provided;Falls prevention discussed -   Is the patient's home free of loose throw rugs in walkways, pet beds, electrical cords, etc?   {Blank single:19197::"yes","no"}      Grab bars in the bathroom? {Blank single:19197::"yes","no"}      Handrails on the stairs?   {Blank single:19197::"yes","no"}      Adequate lighting?   {Blank single:19197::"yes","no"}   Depression Screen PHQ 2/9 Scores 06/26/2019 02/27/2019 01/10/2019 05/17/2018  PHQ - 2 Score _0 PHQ- 9 Score _1 Cognitive Function     6CIT Screen 04/27/2017  What Year? 0 points  What month? 0 points  What time? 0 points  Count back from 20 0 points  Months in reverse 0 points  Repeat phrase 0 points  Total Score 0    Immunization History  Administered Date(s) Administered  . Influenza, High Dose Seasonal PF 07/11/2016, 07/25/2017, 09/13/2018  . Influenza,inj,Quad PF,6+ Mos 06/17/2014, 06/02/2015  . Influenza-Unspecified 08/10/2013  . Pneumococcal Conjugate-13 08/18/2015  . Pneumococcal Polysaccharide-23 08/02/2008, 11/16/2018  . Td 03/26/2008    Screening Tests Health Maintenance  Topic Date Due  . OPHTHALMOLOGY EXAM  12/16/2017  . TETANUS/TDAP  03/26/2018  . URINE MICROALBUMIN  07/25/2018  . HEMOGLOBIN A1C  09/20/2019  . INFLUENZA VACCINE  10/10/2019 (Originally  05/11/2019)  . FOOT EXAM  10/10/2019 (Originally 05/18/2019)  . DEXA SCAN  Completed  . PNA vac Low Risk Adult  Completed       Plan:   ***   I have personally reviewed and noted the following in the patient's chart:   . Medical and social history . Use of alcohol, tobacco or illicit drugs  . Current medications and supplements . Functional ability and status . Nutritional status . Physical activity . Advanced directives . List of other physicians . Hospitalizations, surgeries, and ER visits in previous 12 months . Vitals . Screenings to include cognitive, depression, and falls . Referrals and appointments  In addition, I have reviewed and discussed with patient certain preventive protocols, quality metrics, and best practice recommendations. A written personalized care plan for preventive services as well as general preventive health recommendations were provided to patient.     Joanne Chars, LPN  [phone]

## 2019-11-07 ENCOUNTER — Other Ambulatory Visit: Payer: Self-pay | Admitting: Family Medicine

## 2019-11-07 DIAGNOSIS — F5101 Primary insomnia: Secondary | ICD-10-CM

## 2019-11-07 MED ORDER — ZOLPIDEM TARTRATE 5 MG PO TABS: ORAL_TABLET | ORAL | 1 refills | Status: DC

## 2019-11-07 NOTE — Telephone Encounter (Signed)
Patient called and is in need of a refill of her Ambien sent to [facility] on file. Please advise.

## 2019-12-01 ENCOUNTER — Ambulatory Visit: Payer: Medicare PPO | Attending: Internal Medicine

## 2019-12-01 DIAGNOSIS — Z23 Encounter for immunization: Secondary | ICD-10-CM

## 2019-12-01 NOTE — Progress Notes (Signed)
   Covid-19 Vaccination Clinic  Name:  Carolyn Lambert    MRN: [id number] DOB: [birth year]  12/01/2019  [name] was observed post Covid-19 immunization for 15 minutes without incidence. She was provided with Vaccine Information Sheet and instruction to access the V-Safe system.   [name] was instructed to call 911 with any severe reactions post vaccine: Marland Kitchen Difficulty breathing  . Swelling of your face and throat  . A fast heartbeat  . A bad rash all over your body  . Dizziness and weakness    Immunizations Administered    Name Date Dose VIS Date Route   Pfizer COVID-19 Vaccine 12/01/2019  1:49 PM 0.3 mL 09/20/2019 Intramuscular   Manufacturer: [place]   Lot: [id number]   [place]: [id number]

## 2019-12-17 ENCOUNTER — Ambulatory Visit: Payer: Medicare PPO | Admitting: Family Medicine

## 2019-12-17 ENCOUNTER — Encounter: Payer: Self-pay | Admitting: Family Medicine

## 2019-12-17 ENCOUNTER — Other Ambulatory Visit: Payer: Self-pay

## 2019-12-17 VITALS — BP 173/87 | Ht 67.0 in | Wt 275.0 lb

## 2019-12-17 DIAGNOSIS — E041 Nontoxic single thyroid nodule: Secondary | ICD-10-CM

## 2019-12-17 DIAGNOSIS — E119 Type 2 diabetes mellitus without complications: Secondary | ICD-10-CM | POA: Diagnosis not present

## 2019-12-17 DIAGNOSIS — M21372 Foot drop, left foot: Secondary | ICD-10-CM

## 2019-12-17 DIAGNOSIS — R251 Tremor, unspecified: Secondary | ICD-10-CM

## 2019-12-17 DIAGNOSIS — F332 Major depressive disorder, recurrent severe without psychotic features: Secondary | ICD-10-CM

## 2019-12-17 DIAGNOSIS — I10 Essential (primary) hypertension: Secondary | ICD-10-CM

## 2019-12-17 LAB — POCT GLYCOSYLATED HEMOGLOBIN (HGB A1C): Hemoglobin A1C: 7.1 % — AB (ref 4.0–5.6)

## 2019-12-17 MED ORDER — METOPROLOL SUCCINATE ER 100 MG PO TB24: 150.0000 mg | ORAL_TABLET | Freq: Every day | ORAL | 1 refills | Status: DC

## 2019-12-17 MED ORDER — METOPROLOL SUCCINATE ER 50 MG PO TB24: 150.0000 mg | ORAL_TABLET | Freq: Every day | ORAL | 0 refills | Status: DC

## 2019-12-17 NOTE — Assessment & Plan Note (Signed)
I did double check just to make sure that tremor was not associated with Fosamax.  It can cause some hypocalcemia but tremor was not noted we will check her calcium levels.  We will also check for other electrolyte abnormalities and deficiencies.  Also check her thyroid.  The tremor is not consistent with a Parkinson's type tremor or essential tremor.  In fact as we were talking she would become engaged in the conversation and the tremor would actually stop and calm down briefly.  I do wonder if some of this could be exacerbated by her increasing depression and anxiety symptoms.

## 2019-12-17 NOTE — Assessment & Plan Note (Signed)
Blood pressure is uncontrolled today.  Based on our refill history she should have run out of metoprolol in December.  We will get a go ahead and increase her metoprolol to 150 mg daily in addition to continuing her amlodipine and her furosemide as needed.  She did not do well with ACE inhibitor's and ARB's.  We will need to monitor pulse after the increase of metoprolol to.  Today it is in the 70s so we do have some wiggle room.  But if she is feeling also more fatigued on the metoprolol then we may need to make a change in go back down to 100 mg.  We could always add an alpha-blocker if needed, or hydralazine.

## 2019-12-17 NOTE — Progress Notes (Addendum)
Established Patient Office Visit  Subjective:  Patient ID: Carolyn Lambert, female    DOB: [birth year]  Age: 79 y.o. MRN: [id number]  CC:  Chief Complaint  Patient presents with  . Diabetes  . Hypertension    HPI [name] presents for  Hypertension- Pt denies chest pain, SOB, dizziness, or heart palpitations.  Taking meds as directed w/o problems.  Denies medication side effects.    Diabetes - no hypoglycemic events. No wounds or sores that are not healing well. No increased thirst or urination. Checking glucose at home. Taking medications as prescribed without any side effects.  She also complains of new onset tremor that started about 2 months ago expect affecting all 4 extremities. She did start Fosamax at the end of October.  Otherwise no new medications. No worsening or alleviating factors.  No specific cause.  The only new medication that she is been on is Fosamax which she started at the end of October.  Otherwise no recent changes or adjustments or new starts.  Denies any actual muscle cramping or weakness of the extremities.  She says her symptoms have been present almost daily.  Denies any use of alcohol.  No family history of tremor.  No pain in the extremities.  She has had some pain in her left shoulder joint but says it is not new and its been there she is had rotator cuff problems before.  He has been under a lot more stress after moving in with her daughter.  Has been eating more and eating more carbs because of her stress levels.  She did let me know that she actually fell about a month ago and she tripped over a step in her home.  She said she had some soreness afterwards but no other major injuries.  Past Medical History:  Diagnosis Date  . Anxiety and depression   . Arthritis   . Chronic fatigue syndrome   . Diabetes mellitus   . DVT (deep venous thrombosis) ([place])    s/p knee replacmet.   . Fibromyalgia   . GERD (gastroesophageal reflux  disease)   . Hypercholesteremia   . Hypertension   . Major depressive disorder 03/20/2012  . Obesity   . Peripheral edema   . Sinusitis July 2013    Past Surgical History:  Procedure Laterality Date  . ABDOMINAL HYSTERECTOMY    . BACK SURGERY     x 2  . CATARACT EXTRACTION W/ INTRAOCULAR LENS & ANTERIOR VITRECTOMY Left 11/02/26  . CATARACT EXTRACTION W/ INTRAOCULAR LENS IMPLANT Right 09/28/2016  . ORIF ANKLE FRACTURE Right 11/13/2018   [facility]  . REPLACEMENT TOTAL KNEE Bilateral    2 on left and 1 on right, Dr Wynelle Link  . ROTATOR CUFF REPAIR Right   . TONSILLECTOMY      Family History  Problem Relation Age of Onset  . Hypertension Mother   . Atrial fibrillation Mother   . Diabetes Mother   . Hyperlipidemia Mother   . Cancer Father        lung  . Diabetes type II Father   . Aneurysm Father        abdominal  . Depression Daughter   . Colon cancer Neg Hx     Social History   Socioeconomic History  . Marital status: Married    Spouse name: Not on file  . Number of children: 5  . Years of education: Not on file  . Highest education level: Not on file  Occupational History  . Occupation: retired  Tobacco Use  . Smoking status: Never Smoker  . Smokeless tobacco: Never Used  Substance and Sexual Activity  . Alcohol use: No    Comment: Last drink April 2013  . Drug use: No    Comment: Caffeine: Coffee -2-3  cups per day.   Marland Kitchen Sexual activity: Not Currently  Other Topics Concern  . Not on file  Social History Narrative  . Not on file   Social Determinants of Health   Financial Resource Strain:   . Difficulty of Paying Living Expenses: Not on file  Food Insecurity:   . Worried About Charity fundraiser in the Last Year: Not on file  . Ran Out of Food in the Last Year: Not on file  Transportation Needs:   . Lack of Transportation (Medical): Not on file  . Lack of Transportation (Non-Medical): Not on file  Physical Activity:   . Days of Exercise per Week: Not  on file  . Minutes of Exercise per Session: Not on file  Stress:   . Feeling of Stress : Not on file  Social Connections:   . Frequency of Communication with Friends and Family: Not on file  . Frequency of Social Gatherings with Friends and Family: Not on file  . Attends Religious Services: Not on file  . Active Member of Clubs or Organizations: Not on file  . Attends Archivist Meetings: Not on file  . Marital Status: Not on file  Intimate Partner Violence:   . Fear of Current or Ex-Partner: Not on file  . Emotionally Abused: Not on file  . Physically Abused: Not on file  . Sexually Abused: Not on file    Outpatient Medications Prior to Visit  Medication Sig Dispense Refill  . alendronate (FOSAMAX) 70 MG tablet Take 1 tablet (70 mg total) by mouth once a week. Take with a full glass of water on an empty stomach. 12 tablet 3  . AMBULATORY NON FORMULARY MEDICATION Rollator with extra large seat. Fax to [facility] [phone] 1 each PRN  . amLODipine (NORVASC) 10 MG tablet TAKE 1 TABLET BY MOUTH  DAILY 90 tablet 3  . aspirin EC 81 MG tablet Take 81 mg by mouth daily.    Marland Kitchen atorvastatin (LIPITOR) 20 MG tablet TAKE 1 TABLET BY MOUTH  DAILY 90 tablet 3  . baclofen (LIORESAL) 10 MG tablet TAKE 1 TABLET BY MOUTH 2  TIMES DAILY AS DIRECTED 180 tablet 1  . Blood Glucose Monitoring Suppl (ACCU-CHEK GUIDE) w/Device KIT 1 each by Does not apply route 2 (two) times daily. Dx:E11.9 1 kit 0  . buPROPion (WELLBUTRIN XL) 150 MG 24 hr tablet TAKE 1 TABLET BY MOUTH  TWICE DAILY AT 8AM AND 3PM 180 tablet 1  . DULoxetine (CYMBALTA) 60 MG capsule TAKE 1 CAPSULE BY MOUTH  DAILY 90 capsule 3  . furosemide (LASIX) 20 MG tablet Take 1 tablet (20 mg total) by mouth daily as needed for edema. 20 tablet 3  . glucose blood (ACCU-CHEK SMARTVIEW) test strip Use to check blood sugars 2 times per day 200 each 3  . metFORMIN (GLUCOPHAGE) 1000 MG tablet TAKE 1 TABLET BY MOUTH  TWICE DAILY 180 tablet 3   . zolpidem (AMBIEN) 5 MG tablet TAKE 1 TAB BY MOUTH AT BEDTIME AS NEEDED FOR SLEEP 30 tablet 1  . cetirizine (ZYRTEC) 10 MG tablet Take 10 mg by mouth at bedtime.    Marland Kitchen ketoconazole (NIZORAL) 2 %  shampoo Apply 1 application topically 2 (two) times a week. 120 mL 0  . metoprolol succinate (TOPROL-XL) 100 MG 24 hr tablet TAKE 1 TABLET BY MOUTH  DAILY WITH OR IMMEDIATELY  FOLLOWING A MEAL 90 tablet 1  . permethrin (ELIMITE) 5 % cream Apply 1 application topically daily.    Marland Kitchen triamcinolone cream (KENALOG) 0.1 % Apply 1 application topically 2 (two) times daily as needed. 30 g 0   No facility-administered medications prior to visit.    Allergies  Allergen Reactions  . Losartan Other (See Comments) and Hives    Cough  Cough   . Gabapentin Other (See Comments)    dizziness  . Lisinopril Other (See Comments)    Dry cough  . Oxycodone Nausea And Vomiting  . Oxycodone-Aspirin Nausea And Vomiting    ROS Review of Systems    Objective:    Physical Exam  Constitutional: She is oriented to person, place, and time. She appears well-developed and well-nourished.  HENT:  Head: Normocephalic and atraumatic.  Cardiovascular: Normal rate, regular rhythm and normal heart sounds.  Pulmonary/Chest: Effort normal and breath sounds normal.  Musculoskeletal:     Comments: Shoulders and arms with fairly normal range of motion.  Strength is 5 out of 5 at the elbow wrist and fingers.  Normal rapid alternating movements.  Normal finger-to-nose.  Hip, knee, ankle strength on the right leg is 5 out of 5.  Strength is 4+/5 at the left hip, left knee, and she has foot drop at her left ankle.  He did have a tremor of both hands.  No distinct tremor such as pill-rolling or high-frequency such as essential tremor.  It seemed very jerky and irregular.  And it actually stopped and called at certain points during the conversation.  Neurological: She is alert and oriented to person, place, and time.  Upper extremity  reflexes 2+.  Unable to get adequate evaluation of lower extremity reflexes.  Skin: Skin is warm and dry.  Psychiatric: She has a normal mood and affect. Her behavior is normal.    BP (!) 173/87   Ht '5\' 7"'  (1.702 m)   Wt 275 lb (124.7 kg)   BMI 43.07 kg/m  Wt Readings from Last 3 Encounters:  12/17/19 275 lb (124.7 kg)  06/06/19 280 lb (127 kg)  03/21/19 278 lb (126.1 kg)     Health Maintenance Due  Topic Date Due  . OPHTHALMOLOGY EXAM  12/16/2017  . TETANUS/TDAP  03/26/2018  . URINE MICROALBUMIN  07/25/2018  . FOOT EXAM  05/18/2019    There are no preventive care reminders to display for this patient.  Lab Results  Component Value Date   TSH 2.70 05/30/2018   Lab Results  Component Value Date   WBC 10.6 (H) 07/22/2016   HGB 11.5 (L) 07/22/2016   HCT 35.8 (L) 07/22/2016   MCV 92.3 07/22/2016   PLT 208 07/22/2016   Lab Results  Component Value Date   NA 141 11/13/2018   K 4.4 11/13/2018   CO2 28 05/30/2018   GLUCOSE 142 (H) 05/30/2018   BUN 28 (A) 11/13/2018   CREATININE 1.2 (A) 11/13/2018   BILITOT 0.5 05/30/2018   ALKPHOS 81 05/08/2017   AST 10 05/30/2018   ALT 8 05/30/2018   PROT 6.8 05/30/2018   ALBUMIN 4.0 05/08/2017   CALCIUM 9.3 05/30/2018   ANIONGAP 6 07/22/2016   GFR 87.26 07/29/2013   Lab Results  Component Value Date   CHOL 167 05/30/2018  Lab Results  Component Value Date   HDL 59 05/30/2018   Lab Results  Component Value Date   LDLCALC 84 05/30/2018   Lab Results  Component Value Date   TRIG 144 05/30/2018   Lab Results  Component Value Date   CHOLHDL 2.8 05/30/2018   Lab Results  Component Value Date   HGBA1C 7.1 (A) 12/17/2019      Assessment & Plan:   Problem List Items Addressed This Visit      Cardiovascular and Mediastinum   Essential hypertension - Primary    Blood pressure is uncontrolled today.  Based on our refill history she should have run out of metoprolol in December.  We will get a go ahead and  increase her metoprolol to 150 mg daily in addition to continuing her amlodipine and her furosemide as needed.  She did not do well with ACE inhibitor's and ARB's.  We will need to monitor pulse after the increase of metoprolol to.  Today it is in the 70s so we do have some wiggle room.  But if she is feeling also more fatigued on the metoprolol then we may need to make a change in go back down to 100 mg.  We could always add an alpha-blocker if needed, or hydralazine.      Relevant Medications   metoprolol succinate (TOPROL-XL) 50 MG 24 hr tablet     Endocrine   Thyroid nodule   Relevant Medications   metoprolol succinate (TOPROL-XL) 50 MG 24 hr tablet   Other Relevant Orders   TSH   Diabetes mellitus, type II (HCC)    Uncontrolled. Hemoglobin A1c went up to 7.1 this time from previous of 6.6 back in June.  She admits she has been eating more carbs and sweets I think particularly because her stress levels have been higher.  Just encouraged her to really work on healthy food choices and cutting back on carbs and eating more vegetables.      Relevant Orders   POCT glycosylated hemoglobin (Hb A1C) (Completed)     Other   Tremor    I did double check just to make sure that tremor was not associated with Fosamax.  It can cause some hypocalcemia but tremor was not noted we will check her calcium levels.  We will also check for other electrolyte abnormalities and deficiencies.  Also check her thyroid.  The tremor is not consistent with a Parkinson's type tremor or essential tremor.  In fact as we were talking she would become engaged in the conversation and the tremor would actually stop and calm down briefly.  I do wonder if some of this could be exacerbated by her increasing depression and anxiety symptoms.      Relevant Orders   COMPLETE METABOLIC PANEL WITH GFR   CBC   TSH   MDD (major depressive disorder), recurrent episode, severe ([place])    Work on getting her in with a new psychiatrist  in her local area she moved to [place].  And wants to find a provider who is doing in person visits.      Relevant Orders   Ambulatory referral to Psychiatry     HQ 9 score of 17 today and GAD-7 score of 15 she rates her symptoms as did very difficult.  Meds ordered this encounter  Medications  . DISCONTD: metoprolol succinate (TOPROL-XL) 100 MG 24 hr tablet    Sig: Take 2 tablets (200 mg total) by mouth daily. TAKE 1 TABLET BY  MOUTH  DAILY WITH OR IMMEDIATELY  FOLLOWING A MEAL    Dispense:  135 tablet    Refill:  1  . metoprolol succinate (TOPROL-XL) 50 MG 24 hr tablet    Sig: Take 3 tablets (150 mg total) by mouth daily. For Blood pressure    Dispense:  135 tablet    Refill:  0    Follow-up: Return for Diabetes follow-up.   Time spent 45 min in encounter.   Beatrice Lecher, MD

## 2019-12-17 NOTE — Assessment & Plan Note (Signed)
Work on getting her in with a new psychiatrist in her local area she moved to [place].  And wants to find a provider who is doing in person visits.

## 2019-12-17 NOTE — Assessment & Plan Note (Addendum)
Uncontrolled. Hemoglobin A1c went up to 7.1 this time from previous of 6.6 back in June.  She admits she has been eating more carbs and sweets I think particularly because her stress levels have been higher.  Just encouraged her to really work on healthy food choices and cutting back on carbs and eating more vegetables.

## 2019-12-18 ENCOUNTER — Telehealth: Payer: Self-pay

## 2019-12-18 LAB — CBC
HCT: 42.1 % (ref 35.0–45.0)
Hemoglobin: 13.4 g/dL (ref 11.7–15.5)
MCH: 29.3 pg (ref 27.0–33.0)
MCHC: 31.8 g/dL — ABNORMAL LOW (ref 32.0–36.0)
MCV: 91.9 fL (ref 80.0–100.0)
MPV: 10.8 fL (ref 7.5–12.5)
Platelets: 279 10*3/uL (ref 140–400)
RBC: 4.58 10*6/uL (ref 3.80–5.10)
RDW: 13.4 % (ref 11.0–15.0)
WBC: 8.8 10*3/uL (ref 3.8–10.8)

## 2019-12-18 LAB — COMPLETE METABOLIC PANEL WITH GFR
AG Ratio: 1.6 (calc) (ref 1.0–2.5)
ALT: 9 U/L (ref 6–29)
AST: 10 U/L (ref 10–35)
Albumin: 4.1 g/dL (ref 3.6–5.1)
Alkaline phosphatase (APISO): 93 U/L (ref 37–153)
BUN/Creatinine Ratio: 18 (calc) (ref 6–22)
BUN: 24 mg/dL (ref 7–25)
CO2: 26 mmol/L (ref 20–32)
Calcium: 9.5 mg/dL (ref 8.6–10.4)
Chloride: 108 mmol/L (ref 98–110)
Creat: 1.36 mg/dL — ABNORMAL HIGH (ref 0.60–0.93)
GFR, Est African American: 43 mL/min/{1.73_m2} — ABNORMAL LOW (ref >=60)
GFR, Est Non African American: 37 mL/min/{1.73_m2} — ABNORMAL LOW (ref >=60)
Globulin: 2.5 g/dL (calc) (ref 1.9–3.7)
Glucose, Bld: 165 mg/dL — ABNORMAL HIGH (ref 65–99)
Potassium: 4.5 mmol/L (ref 3.5–5.3)
Sodium: 145 mmol/L (ref 135–146)
Total Bilirubin: 0.3 mg/dL (ref 0.2–1.2)
Total Protein: 6.6 g/dL (ref 6.1–8.1)

## 2019-12-18 LAB — TSH: TSH: 1.66 mIU/L (ref 0.40–4.50)

## 2019-12-18 NOTE — Telephone Encounter (Signed)
Pharmacy advised  

## 2019-12-18 NOTE — Telephone Encounter (Signed)
The pharmacy called and left a message asking to clarify the dose and directions for the Metoprolol. Please advise.

## 2019-12-18 NOTE — Telephone Encounter (Signed)
I want her to take 150 mg of extended release.  So I have changed it to the 50s so that she can just take 3 of them.  But if she can take the 100 mg extended release and then just take 1-1/2 and that would want to.

## 2019-12-25 ENCOUNTER — Ambulatory Visit: Payer: Medicare PPO

## 2019-12-31 ENCOUNTER — Ambulatory Visit: Payer: Medicare PPO | Attending: Internal Medicine

## 2019-12-31 ENCOUNTER — Ambulatory Visit: Payer: Medicare PPO

## 2019-12-31 DIAGNOSIS — Z23 Encounter for immunization: Secondary | ICD-10-CM

## 2019-12-31 NOTE — Progress Notes (Signed)
   Covid-19 Vaccination Clinic  Name:  Carolyn Lambert    MRN: [id number] DOB: Sep 20, 1941  12/31/2019  Carolyn Lambert was observed post Covid-19 immunization for 15 minutes without incident. She was provided with Vaccine Information Sheet and instruction to access the V-Safe system.   Carolyn Lambert was instructed to call 911 with any severe reactions post vaccine: Marland Kitchen Difficulty breathing  . Swelling of face and throat  . A fast heartbeat  . A bad rash all over body  . Dizziness and weakness   Immunizations Administered    Name Date Dose VIS Date Route   Pfizer COVID-19 Vaccine 12/31/2019 12:11 PM 0.3 mL 09/20/2019 Intramuscular   Manufacturer: [place]   Lot: [id number]   [place]: [id number]

## 2020-01-03 ENCOUNTER — Telehealth: Payer: Self-pay

## 2020-01-03 NOTE — Telephone Encounter (Signed)
Okay to place referral.  We can see if we can find something closer to her or see if she has a preference for location and or provider.

## 2020-01-03 NOTE — Telephone Encounter (Signed)
Mahsa states she would like to proceed with the psychiatry referral.

## 2020-01-06 NOTE — Telephone Encounter (Signed)
Patient aware.

## 2020-01-06 NOTE — Telephone Encounter (Signed)
Referral placed.

## 2020-01-07 ENCOUNTER — Telehealth: Payer: Self-pay

## 2020-01-07 DIAGNOSIS — I10 Essential (primary) hypertension: Secondary | ICD-10-CM

## 2020-01-07 DIAGNOSIS — E119 Type 2 diabetes mellitus without complications: Secondary | ICD-10-CM

## 2020-01-07 MED ORDER — METOPROLOL SUCCINATE ER 100 MG PO TB24: ORAL_TABLET | ORAL | 1 refills | Status: DC

## 2020-01-07 MED ORDER — AMBULATORY NON FORMULARY MEDICATION: 0 refills | Status: DC

## 2020-01-07 NOTE — Telephone Encounter (Signed)
Carolyn Lambert called and states the insurance company doesn't want to pay for 3 tablets daily of the Metoprolol. They will pay for Metoprolol 100 mg to take 1 and 1/2 tablets daily or Metoprolol 100 mg once daily and Metoprolol 50 mg once daily.   She is also requesting a prescription for DM shoes to be faxed to [name] at [phone]

## 2020-01-07 NOTE — Telephone Encounter (Signed)
Unable to leave a message.

## 2020-01-07 NOTE — Telephone Encounter (Signed)
New prescription sent for metoprolol.  Printed prescription for diabetic shoes as well.

## 2020-01-08 NOTE — Telephone Encounter (Signed)
Patient advised.

## 2020-01-27 ENCOUNTER — Other Ambulatory Visit: Payer: Self-pay | Admitting: *Deleted

## 2020-01-27 DIAGNOSIS — I10 Essential (primary) hypertension: Secondary | ICD-10-CM

## 2020-01-27 MED ORDER — METOPROLOL SUCCINATE ER 100 MG PO TB24: ORAL_TABLET | ORAL | 1 refills | Status: DC

## 2020-01-29 NOTE — Progress Notes (Deleted)
Psychiatric Initial Adult Assessment   Patient Identification: Carolyn Lambert MRN:  [id number] Date of Evaluation:  01/29/2020 Referral Source: DR. Hali Marry, MD Chief Complaint:   Visit Diagnosis: No diagnosis found.  History of Present Illness:   Carolyn Lambert is a 79 y.o. year old female with a history of depression, type II diabetes, tremor, thyroid nodule, who is referred for depression.   Associated Signs/Symptoms: Depression Symptoms:  {DEPRESSION SYMPTOMS:20000} (Hypo) Manic Symptoms:  {BHH MANIC SYMPTOMS:22872} Anxiety Symptoms:  {BHH ANXIETY SYMPTOMS:22873} Psychotic Symptoms:  {BHH PSYCHOTIC SYMPTOMS:22874} PTSD Symptoms: {BHH PTSD SYMPTOMS:22875}  Past Psychiatric History:  Outpatient:  Psychiatry admission:  Previous suicide attempt:  Past trials of medication:  History of violence:   Previous Psychotropic Medications: {YES/NO:21197}  Substance Abuse History in the last 12 months:  {yes no:314532}  Consequences of Substance Abuse: {BHH CONSEQUENCES OF SUBSTANCE ABUSE:22880}  Past Medical History:  Past Medical History:  Diagnosis Date  . Anxiety and depression   . Arthritis   . Chronic fatigue syndrome   . Diabetes mellitus   . DVT (deep venous thrombosis) ([place])    s/p knee replacmet.   . Fibromyalgia   . GERD (gastroesophageal reflux disease)   . Hypercholesteremia   . Hypertension   . Major depressive disorder 03/20/2012  . Obesity   . Peripheral edema   . Sinusitis July 2013    Past Surgical History:  Procedure Laterality Date  . ABDOMINAL HYSTERECTOMY    . BACK SURGERY     x 2  . CATARACT EXTRACTION W/ INTRAOCULAR LENS & ANTERIOR VITRECTOMY Left 11/02/26  . CATARACT EXTRACTION W/ INTRAOCULAR LENS IMPLANT Right 09/28/2016  . ORIF ANKLE FRACTURE Right 11/13/2018   [facility]  . REPLACEMENT TOTAL KNEE Bilateral    2 on left and 1 on right, Dr Wynelle Link  . ROTATOR CUFF REPAIR Right   . TONSILLECTOMY      Family  Psychiatric History: ***  Family History:  Family History  Problem Relation Age of Onset  . Hypertension Mother   . Atrial fibrillation Mother   . Diabetes Mother   . Hyperlipidemia Mother   . Cancer Father        lung  . Diabetes type II Father   . Aneurysm Father        abdominal  . Depression Daughter   . Colon cancer Neg Hx     Social History:   Social History   Socioeconomic History  . Marital status: Married    Spouse name: Not on file  . Number of children: 5  . Years of education: Not on file  . Highest education level: Not on file  Occupational History  . Occupation: retired  Tobacco Use  . Smoking status: Never Smoker  . Smokeless tobacco: Never Used  Substance and Sexual Activity  . Alcohol use: No    Comment: Last drink April 2013  . Drug use: No    Comment: Caffeine: Coffee -2-3  cups per day.   Marland Kitchen Sexual activity: Not Currently  Other Topics Concern  . Not on file  Social History Narrative  . Not on file   Social Determinants of Health   Financial Resource Strain:   . Difficulty of Paying Living Expenses:   Food Insecurity:   . Worried About Charity fundraiser in the Last Year:   . Arboriculturist in the Last Year:   Transportation Needs:   . Film/video editor (Medical):   Marland Kitchen Lack of  Transportation (Non-Medical):   Physical Activity:   . Days of Exercise per Week:   . Minutes of Exercise per Session:   Stress:   . Feeling of Stress :   Social Connections:   . Frequency of Communication with Friends and Family:   . Frequency of Social Gatherings with Friends and Family:   . Attends Religious Services:   . Active Member of Clubs or Organizations:   . Attends Archivist Meetings:   Marland Kitchen Marital Status:     Additional Social History: ***  Allergies:   Allergies  Allergen Reactions  . Losartan Other (See Comments) and Hives    Cough  Cough   . Gabapentin Other (See Comments)    dizziness  . Lisinopril Other (See  Comments)    Dry cough  . Oxycodone Nausea And Vomiting  . Oxycodone-Aspirin Nausea And Vomiting    Metabolic Disorder Labs: Lab Results  Component Value Date   HGBA1C 7.1 (A) 12/17/2019   No results found for: PROLACTIN Lab Results  Component Value Date   CHOL 167 05/30/2018   TRIG 144 05/30/2018   HDL 59 05/30/2018   CHOLHDL 2.8 05/30/2018   VLDL 21 04/25/2016   LDLCALC 84 05/30/2018   LDLCALC 56 04/25/2016   Lab Results  Component Value Date   TSH 1.66 12/17/2019    Therapeutic Level Labs: No results found for: LITHIUM No results found for: CBMZ No results found for: VALPROATE  Current Medications: Current Outpatient Medications  Medication Sig Dispense Refill  . alendronate (FOSAMAX) 70 MG tablet Take 1 tablet (70 mg total) by mouth once a week. Take with a full glass of water on an empty stomach. 12 tablet 3  . AMBULATORY NON FORMULARY MEDICATION Rollator with extra large seat. Fax to [place] [phone] 1 each PRN  . AMBULATORY NON FORMULARY MEDICATION Dx DM E11.9 - DM shoes - Fax to [phone] 1 each 0  . amLODipine (NORVASC) 10 MG tablet TAKE 1 TABLET BY MOUTH  DAILY 90 tablet 3  . aspirin EC 81 MG tablet Take 81 mg by mouth daily.    Marland Kitchen atorvastatin (LIPITOR) 20 MG tablet TAKE 1 TABLET BY MOUTH  DAILY 90 tablet 3  . baclofen (LIORESAL) 10 MG tablet TAKE 1 TABLET BY MOUTH 2  TIMES DAILY AS DIRECTED 180 tablet 1  . Blood Glucose Monitoring Suppl (ACCU-CHEK GUIDE) w/Device KIT 1 each by Does not apply route 2 (two) times daily. Dx:E11.9 1 kit 0  . buPROPion (WELLBUTRIN XL) 150 MG 24 hr tablet TAKE 1 TABLET BY MOUTH  TWICE DAILY AT 8AM AND 3PM 180 tablet 1  . DULoxetine (CYMBALTA) 60 MG capsule TAKE 1 CAPSULE BY MOUTH  DAILY 90 capsule 3  . furosemide (LASIX) 20 MG tablet Take 1 tablet (20 mg total) by mouth daily as needed for edema. 20 tablet 3  . glucose blood (ACCU-CHEK SMARTVIEW) test strip Use to check blood sugars 2 times per day 200 each 3  .  metFORMIN (GLUCOPHAGE) 1000 MG tablet TAKE 1 TABLET BY MOUTH  TWICE DAILY 180 tablet 3  . metoprolol succinate (TOPROL-XL) 100 MG 24 hr tablet 1.5 tabs PO QD.  For Blood pressure 270 tablet 1  . zolpidem (AMBIEN) 5 MG tablet TAKE 1 TAB BY MOUTH AT BEDTIME AS NEEDED FOR SLEEP 30 tablet 1   No current facility-administered medications for this visit.    Musculoskeletal: Strength & Muscle Tone: N/A Gait & Station: N/A Patient leans: N/A  Psychiatric  Specialty Exam: Review of Systems  There were no vitals taken for this visit.There is no height or weight on file to calculate BMI.  General Appearance: {Appearance:22683}  Eye Contact:  {BHH EYE CONTACT:22684}  Speech:  Clear and Coherent  Volume:  Normal  Mood:  {BHH MOOD:22306}  Affect:  {Affect (PAA):22687}  Thought Process:  Coherent  Orientation:  Full (Time, Place, and Person)  Thought Content:  Logical  Suicidal Thoughts:  {ST/HT (PAA):22692}  Homicidal Thoughts:  {ST/HT (PAA):22692}  Memory:  Immediate;   Good  Judgement:  {Judgement (PAA):22694}  Insight:  {Insight (PAA):22695}  Psychomotor Activity:  Normal  Concentration:  Concentration: Good and Attention Span: Good  Recall:  Good  Fund of Knowledge:Good  Language: Good  Akathisia:  No  Handed:  Right  AIMS (if indicated):  not done  Assets:  Communication Skills Desire for Improvement  ADL's:  Intact  Cognition: WNL  Sleep:  {BHH GOOD/FAIR/POOR:22877}   Screenings: GAD-7     Office Visit from 12/17/2019 in [place] Office Visit from 06/26/2019 in [place] Office Visit from 01/10/2019 in [place]  Total GAD-7 Score  '15  1  6    ' PHQ2-9     Office Visit from 12/17/2019 in [place] Office Visit from 06/26/2019 in [place] Visit from 02/27/2019 in [place] Office Visit from 01/10/2019 in [place] Office Visit from 05/17/2018 in [place]  PHQ-2 Total Score  '5  2  1  3  2  ' PHQ-9 Total Score  '17  10  6  11  5      ' Assessment and Plan:    Plan  The patient demonstrates the following risk factors for suicide: Chronic risk factors for suicide include: {Chronic Risk Factors for JFKDCMM:66056372}. Acute risk factors for suicide include: {Acute Risk Factors for LCMIMJI:04849865}. Protective factors for this patient include: {Protective Factors for Suicide RMUU:10424731}. Considering these factors, the overall suicide risk at this point appears to be {Desc; low/moderate/high:110033}. Patient {ACTION; IS/IS RUY:38365427} appropriate for outpatient follow up.    Norman Clay, MD 4/21/20211:44 PM

## 2020-01-31 ENCOUNTER — Other Ambulatory Visit: Payer: Self-pay

## 2020-01-31 DIAGNOSIS — I10 Essential (primary) hypertension: Secondary | ICD-10-CM

## 2020-01-31 MED ORDER — DULOXETINE HCL 60 MG PO CPEP: 60.0000 mg | ORAL_CAPSULE | Freq: Every day | ORAL | 2 refills | Status: DC

## 2020-01-31 MED ORDER — AMLODIPINE BESYLATE 10 MG PO TABS: 10.0000 mg | ORAL_TABLET | Freq: Every day | ORAL | 2 refills | Status: DC

## 2020-01-31 MED ORDER — PRODIGY TWIST TOP LANCETS 28G MISC: 3 refills | Status: DC

## 2020-01-31 MED ORDER — BD SWAB SINGLE USE REGULAR PADS: MEDICATED_PAD | 3 refills | Status: DC

## 2020-01-31 MED ORDER — BACLOFEN 10 MG PO TABS: ORAL_TABLET | ORAL | 1 refills | Status: DC

## 2020-01-31 MED ORDER — PRODIGY AUTOCODE BLOOD GLUCOSE W/DEVICE KIT: 1.0000 | PACK | Freq: Every day | 0 refills | Status: DC

## 2020-01-31 MED ORDER — PRODIGY NO CODING BLOOD GLUC VI STRP: ORAL_STRIP | 12 refills | Status: DC

## 2020-01-31 MED ORDER — PRODIGY CONTROL SOLUTION LOW VI SOLN: 3 refills | Status: DC

## 2020-01-31 MED ORDER — BUPROPION HCL ER (XL) 150 MG PO TB24: ORAL_TABLET | ORAL | 1 refills | Status: DC

## 2020-01-31 MED ORDER — ATORVASTATIN CALCIUM 20 MG PO TABS: 20.0000 mg | ORAL_TABLET | Freq: Every day | ORAL | 2 refills | Status: DC

## 2020-02-03 ENCOUNTER — Other Ambulatory Visit: Payer: Self-pay | Admitting: *Deleted

## 2020-02-03 DIAGNOSIS — I10 Essential (primary) hypertension: Secondary | ICD-10-CM

## 2020-02-03 MED ORDER — METOPROLOL SUCCINATE ER 100 MG PO TB24: ORAL_TABLET | ORAL | 1 refills | Status: DC

## 2020-02-03 MED ORDER — METFORMIN HCL 1000 MG PO TABS: 1000.0000 mg | ORAL_TABLET | Freq: Two times a day (BID) | ORAL | 3 refills | Status: DC

## 2020-02-05 ENCOUNTER — Telehealth (HOSPITAL_COMMUNITY): Payer: Medicare PPO | Admitting: Psychiatry

## 2020-02-06 ENCOUNTER — Telehealth: Payer: Self-pay | Admitting: Family Medicine

## 2020-02-06 NOTE — Telephone Encounter (Signed)
Received fax for PA on Prodigy No Coding Blood Glucose Strips sent through cover my meds waiting on determination. - CF

## 2020-02-11 NOTE — Telephone Encounter (Signed)
Received fax from [facility] and they approved Prodigy No coding test strips. - CF

## 2020-03-19 ENCOUNTER — Ambulatory Visit: Payer: Medicare PPO | Admitting: Family Medicine

## 2020-03-24 ENCOUNTER — Ambulatory Visit: Payer: Medicare PPO | Admitting: Family Medicine

## 2020-04-06 ENCOUNTER — Telehealth: Payer: Self-pay

## 2020-04-06 DIAGNOSIS — R21 Rash and other nonspecific skin eruption: Secondary | ICD-10-CM

## 2020-04-06 NOTE — Telephone Encounter (Signed)
Carolyn Lambert was seen by Dr Madilyn Fireman and Dermatology for the rash on her scalp. She now would like to see someone with the [facility] infection program. Please advise.

## 2020-04-06 NOTE — Telephone Encounter (Signed)
Ok for referral?

## 2020-04-06 NOTE — Telephone Encounter (Signed)
Referral placed. Patient is aware.

## 2020-04-27 ENCOUNTER — Encounter: Payer: Medicare PPO | Admitting: Nurse Practitioner

## 2020-04-28 ENCOUNTER — Other Ambulatory Visit: Payer: Self-pay | Admitting: Family Medicine

## 2020-04-28 DIAGNOSIS — F5101 Primary insomnia: Secondary | ICD-10-CM

## 2020-05-12 ENCOUNTER — Other Ambulatory Visit: Payer: Self-pay

## 2020-05-12 ENCOUNTER — Ambulatory Visit (INDEPENDENT_AMBULATORY_CARE_PROVIDER_SITE_OTHER): Payer: Medicare PPO | Admitting: Nurse Practitioner

## 2020-05-12 ENCOUNTER — Encounter: Payer: Self-pay | Admitting: Nurse Practitioner

## 2020-05-12 VITALS — BP 169/86 | HR 86 | Temp 98.1°F | Ht 67.0 in | Wt 271.3 lb

## 2020-05-12 DIAGNOSIS — Z0000 Encounter for general adult medical examination without abnormal findings: Secondary | ICD-10-CM

## 2020-05-12 DIAGNOSIS — M79675 Pain in left toe(s): Secondary | ICD-10-CM | POA: Diagnosis not present

## 2020-05-12 DIAGNOSIS — M21372 Foot drop, left foot: Secondary | ICD-10-CM

## 2020-05-12 DIAGNOSIS — Z1231 Encounter for screening mammogram for malignant neoplasm of breast: Secondary | ICD-10-CM

## 2020-05-12 DIAGNOSIS — S82891A Other fracture of right lower leg, initial encounter for closed fracture: Secondary | ICD-10-CM

## 2020-05-12 NOTE — Patient Instructions (Addendum)
You spoke to Orma Render, NP over the phone for your annual wellness visit.  We discussed goals:  Goals      Weight (lb) < 200 lb (90.7 kg) (pt-stated)      I would like to loose about 50 pounds this next year.        We also discussed recommended health maintenance. Please call our office and schedule a visit.    As discussed, you are due for: Health Maintenance  Topic Date Due   INFLUENZA VACCINE  05/10/2020   FOOT EXAM  05/19/2020 (Originally 05/18/2019)   OPHTHALMOLOGY EXAM  05/19/2020 (Originally 12/16/2017)   URINE MICROALBUMIN  05/19/2020 (Originally 07/25/2018)   TETANUS/TDAP  05/19/2020 (Originally 03/26/2018)   Hepatitis C Screening  05/26/2020 (Originally 1941/09/12)   HEMOGLOBIN A1C  06/18/2020   DEXA SCAN  Completed   COVID-19 Vaccine  Completed   PNA vac Low Risk Adult  Completed   I have placed a referral for [facility] for you to discuss a new foot brace.   I have placed a referral for Podiatry to have your toe evaluated.   I have placed the order for a mammogram, they will call you to schedule.   I have placed the order for a DEXA bone density scan and they will call you to schedule that.     If you have any questions, please call the office at [phone].    Bone Density Test The bone density test uses a special type of X-ray to measure the amount of calcium and other minerals in your bones. It can measure bone density in the hip and the spine. The test procedure is similar to having a regular X-ray. This test may also be called:  Bone densitometry.  Bone mineral density test.  Dual-energy X-ray absorptiometry (DEXA). You may have this test to:  Diagnose a condition that causes weak or thin bones (osteoporosis).  Screen you for osteoporosis.  Predict your risk for a broken bone (fracture).  Determine how well your osteoporosis treatment is working. Tell a health care provider about:  Any allergies you have.  All  medicines you are taking, including vitamins, herbs, eye drops, creams, and over-the-counter medicines.  Any problems you or family members have had with anesthetic medicines.  Any blood disorders you have.  Any surgeries you have had.  Any medical conditions you have.  Whether you are pregnant or may be pregnant.  Any medical tests you have had within the past 14 days that used contrast material. What are the risks? Generally, this is a safe procedure. However, it does expose you to a small amount of radiation, which can slightly increase your cancer risk. What happens before the procedure?  Do not take any calcium supplements starting 24 hours before your test.  Remove all metal jewelry, eyeglasses, dental appliances, and any other metal objects. What happens during the procedure?   You will lie down on an exam table. There will be an X-ray generator below you and an imaging device above you.  Other devices, such as boxes or braces, may be used to position your body properly for the scan.  The machine will slowly scan your body. You will need to keep still.  The images will show up on a screen in the room. Images will be examined by a specialist after your test is done. The procedure may vary among health care providers and hospitals. What happens after the procedure?  It is up to you to  get your test results. Ask your health care provider, or the department that is doing the test, when your results will be ready. Summary  A bone density test is an imaging test that uses a type of X-ray to measure the amount of calcium and other minerals in your bones.  The test may be used to diagnose or screen you for a condition that causes weak or thin bones (osteoporosis), predict your risk for a broken bone (fracture), or determine how well your osteoporosis treatment is working.  Do not take any calcium supplements starting 24 hours before your test.  Ask your health care provider,  or the department that is doing the test, when your results will be ready. This information is not intended to replace advice given to you by your health care provider. Make sure you discuss any questions you have with your health care provider. Document Revised: 10/12/2017 Document Reviewed: 07/31/2017 Elsevier Patient Education  2020 [place] Maintenance, Female Adopting a healthy lifestyle and getting preventive care are important in promoting health and wellness. Ask your health care provider about:  The right schedule for you to have regular tests and exams.  Things you can do on your own to prevent diseases and keep yourself healthy. What should I know about diet, weight, and exercise? Eat a healthy diet   Eat a diet that includes plenty of vegetables, fruits, low-fat dairy products, and lean protein.  Do not eat a lot of foods that are high in solid fats, added sugars, or sodium. Maintain a healthy weight Body mass index (BMI) is used to identify weight problems. It estimates body fat based on height and weight. Your health care provider can help determine your BMI and help you achieve or maintain a healthy weight. Get regular exercise Get regular exercise. This is one of the most important things you can do for your health. Most adults should:  Exercise for at least 150 minutes each week. The exercise should increase your heart rate and make you sweat (moderate-intensity exercise).  Do strengthening exercises at least twice a week. This is in addition to the moderate-intensity exercise.  Spend less time sitting. Even light physical activity can be beneficial. Watch cholesterol and blood lipids Have your blood tested for lipids and cholesterol at 79 years of age, then have this test every 5 years. Have your cholesterol levels checked more often if:  Your lipid or cholesterol levels are high.  You are older than 79 years of age.  You are at high risk for  heart disease. What should I know about cancer screening? Depending on your health history and family history, you may need to have cancer screening at various ages. This may include screening for:  Breast cancer.  Cervical cancer.  Colorectal cancer.  Skin cancer.  Lung cancer. What should I know about heart disease, diabetes, and high blood pressure? Blood pressure and heart disease  High blood pressure causes heart disease and increases the risk of stroke. This is more likely to develop in people who have high blood pressure readings, are of African descent, or are overweight.  Have your blood pressure checked: ? Every 3-5 years if you are 95-60 years of age. ? Every year if you are 30 years old or older. Diabetes Have regular diabetes screenings. This checks your fasting blood sugar level. Have the screening done:  Once every three years after age 2 if you are at a normal weight and have a low  risk for diabetes.  More often and at a younger age if you are overweight or have a high risk for diabetes. What should I know about preventing infection? Hepatitis B If you have a higher risk for hepatitis B, you should be screened for this virus. Talk with your health care provider to find out if you are at risk for hepatitis B infection. Hepatitis C Testing is recommended for:  Everyone born from [birth year] through 1965.  Anyone with known risk factors for hepatitis C. Sexually transmitted infections (STIs)  Get screened for STIs, including gonorrhea and chlamydia, if: ? You are sexually active and are younger than 79 years of age. ? You are older than 79 years of age and your health care provider tells you that you are at risk for this type of infection. ? Your sexual activity has changed since you were last screened, and you are at increased risk for chlamydia or gonorrhea. Ask your health care provider if you are at risk.  Ask your health care provider about whether you are at  high risk for HIV. Your health care provider may recommend a prescription medicine to help prevent HIV infection. If you choose to take medicine to prevent HIV, you should first get tested for HIV. You should then be tested every 3 months for as long as you are taking the medicine. Pregnancy  If you are about to stop having your period (premenopausal) and you may become pregnant, seek counseling before you get pregnant.  Take 400 to 800 micrograms (mcg) of folic acid every day if you become pregnant.  Ask for birth control (contraception) if you want to prevent pregnancy. Osteoporosis and menopause Osteoporosis is a disease in which the bones lose minerals and strength with aging. This can result in bone fractures. If you are 16 years old or older, or if you are at risk for osteoporosis and fractures, ask your health care provider if you should:  Be screened for bone loss.  Take a calcium or vitamin D supplement to lower your risk of fractures.  Be given hormone replacement therapy (HRT) to treat symptoms of menopause. Follow these instructions at home: Lifestyle  Do not use any products that contain nicotine or tobacco, such as cigarettes, e-cigarettes, and chewing tobacco. If you need help quitting, ask your health care provider.  Do not use street drugs.  Do not share needles.  Ask your health care provider for help if you need support or information about quitting drugs. Alcohol use  Do not drink alcohol if: ? Your health care provider tells you not to drink. ? You are pregnant, may be pregnant, or are planning to become pregnant.  If you drink alcohol: ? Limit how much you use to 0-1 drink a day. ? Limit intake if you are breastfeeding.  Be aware of how much alcohol is in your drink. In the [place] one drink equals one 12 oz bottle of beer (355 mL), one 5 oz glass of wine (148 mL), or one 1 oz glass of hard liquor (44 mL). General instructions  Schedule regular health,  dental, and eye exams.  Stay current with your vaccines.  Tell your health care provider if: ? You often feel depressed. ? You have ever been abused or do not feel safe at home. Summary  Adopting a healthy lifestyle and getting preventive care are important in promoting health and wellness.  Follow your health care provider's instructions about healthy diet, exercising, and getting tested or screened  for diseases.  Follow your health care provider's instructions on monitoring your cholesterol and blood pressure. This information is not intended to replace advice given to you by your health care provider. Make sure you discuss any questions you have with your health care provider. Document Revised: 09/19/2018 Document Reviewed: 09/19/2018 Elsevier Patient Education  [place] A mammogram is a low energy X-ray of the breasts that is done to check for abnormal changes. This procedure can screen for and detect any changes that may indicate breast cancer. Mammograms are regularly done on women. A man may have a mammogram if he has a lump or swelling in his breast. A mammogram can also identify other changes and variations in the breast, such as:  Inflammation of the breast tissue (mastitis).  An infected area that contains a collection of pus (abscess).  A fluid-filled sac (cyst).  Fibrocystic changes. This is when breast tissue becomes denser, which can make the tissue feel rope-like or uneven under the skin.  Tumors that are not cancerous (benign). Tell a health care provider:  About any allergies you have.  If you have breast implants.  If you have had previous breast disease, biopsy, or surgery.  If you are breastfeeding.  If you are younger than age 16.  If you have a family history of breast cancer.  Whether you are pregnant or may be pregnant. What are the risks? Generally, this is a safe procedure. However, problems may occur,  including:  Exposure to radiation. Radiation levels are very low with this test.  The results being misinterpreted.  The need for further tests.  The inability of the mammogram to detect certain cancers. What happens before the procedure?  Schedule your test about 1-2 weeks after your menstrual period if you are still menstruating. This is usually when your breasts are the least tender.  If you have had a mammogram done at a different facility in the past, get the mammogram X-rays or have them sent to your current exam facility. The new and old images will be compared.  Wash your breasts and underarms on the day of the test.  Do not wear deodorants, perfumes, lotions, or powders anywhere on your body on the day of the test.  Remove any jewelry from your neck.  Wear clothes that you can change into and out of easily. What happens during the procedure?   You will undress from the waist up and put on a gown that opens in the front.  You will stand in front of the X-ray machine.  Each breast will be placed between two plastic or glass plates. The plates will compress your breast for a few seconds. Try to stay as relaxed as possible during the procedure. This does not cause any harm to your breasts and any discomfort you feel will be very brief.  X-rays will be taken from different angles of each breast. The procedure may vary among health care providers and hospitals. What happens after the procedure?  The mammogram will be examined by a specialist (radiologist).  You may need to repeat certain parts of the test, depending on the quality of the images. This is commonly done if the radiologist needs a better view of the breast tissue.  You may resume your normal activities.  It is up to you to get the results of your procedure. Ask your health care provider, or the department that is doing the procedure, when your results will be ready. Summary  A mammogram is a low energy  X-ray of the breasts that is done to check for abnormal changes. A man may have a mammogram if he has a lump or swelling in his breast.  If you have had a mammogram done at a different facility in the past, get the mammogram X-rays or have them sent to your current exam facility in order to compare them.  Schedule your test about 1-2 weeks after your menstrual period if you are still menstruating.  For this test, each breast will be placed between two plastic or glass plates. The plates will compress your breast for a few seconds.  Ask when your test results will be ready. Make sure you get your test results. This information is not intended to replace advice given to you by your health care provider. Make sure you discuss any questions you have with your health care provider. Document Revised: 05/17/2018 Document Reviewed: 05/17/2018 Elsevier Patient Education  [place].

## 2020-05-12 NOTE — Progress Notes (Signed)
Subjective:   Carolyn Lambert is a 79 y.o. female who presents for Medicare Annual (Subsequent) preventive examination.        Objective:     Vitals: BP (!) 169/100   Pulse 86   Temp 98.1 F (36.7 C) (Oral)   Ht _0  (1.702 m)   Wt 271 lb 4.8 oz (123.1 kg)   SpO2 97%   BMI 42.49 kg/m   Body mass index is 42.49 kg/m.  Advanced Directives 05/12/2020 07/23/2016 07/22/2016  Does Patient Have a Medical Advance Directive? Yes Yes Yes  Type of Advance Directive - Healthcare Power of [place]  Does patient want to make changes to medical advance directive? No - Patient declined No - Patient declined -  Copy of [place] in Chart? - No - copy requested -    Tobacco Social History   Tobacco Use  Smoking Status Never Smoker  Smokeless Tobacco Never Used     Counseling given: Not Answered   Clinical Intake:  Pre-visit preparation completed: Yes  Pain : No/denies pain     BMI - recorded: 42.49 Nutritional Status: BMI > 30  Obese Diabetes: Yes CBG done?: No Did pt. bring in CBG monitor from home?: No  How often do you need to have someone help you when you read instructions, pamphlets, or other written materials from your doctor or pharmacy?: 1 - Never What is the last grade level you completed in school?: 9th grade, took GED  Interpreter Needed?: No  Information entered by :: [place],CMA  Past Medical History:  Diagnosis Date  . Anxiety and depression   . Arthritis   . Chronic fatigue syndrome   . Diabetes mellitus   . DVT (deep venous thrombosis) ([place])    s/p knee replacmet.   . Fibromyalgia   . GERD (gastroesophageal reflux disease)   . Hypercholesteremia   . Hypertension   . Major depressive disorder 03/20/2012  . Obesity   . Peripheral edema   . Sinusitis July 2013   Past Surgical History:  Procedure Laterality Date  . ABDOMINAL HYSTERECTOMY    . BACK SURGERY     x 2  . CATARACT EXTRACTION  W/ INTRAOCULAR LENS & ANTERIOR VITRECTOMY Left 11/02/26  . CATARACT EXTRACTION W/ INTRAOCULAR LENS IMPLANT Right 09/28/2016  . ORIF ANKLE FRACTURE Right 11/13/2018   [facility]  . REPLACEMENT TOTAL KNEE Bilateral    2 on left and 1 on right, Dr Wynelle Link  . ROTATOR CUFF REPAIR Right   . TONSILLECTOMY     Family History  Problem Relation Age of Onset  . Hypertension Mother   . Atrial fibrillation Mother   . Diabetes Mother   . Hyperlipidemia Mother   . Cancer Father        lung  . Diabetes type II Father   . Aneurysm Father        abdominal  . Depression Daughter   . Colon cancer Neg Hx    Social History   Socioeconomic History  . Marital status: Married    Spouse name: Not on file  . Number of children: 5  . Years of education: Not on file  . Highest education level: Not on file  Occupational History  . Occupation: retired  Tobacco Use  . Smoking status: Never Smoker  . Smokeless tobacco: Never Used  Vaping Use  . Vaping Use: Never used  Substance and Sexual Activity  . Alcohol use: Yes  Comment: Occasionally, wine  . Drug use: No    Comment: Caffeine: Coffee -2-3  cups per day.   Marland Kitchen Sexual activity: Not Currently  Other Topics Concern  . Not on file  Social History Narrative  . Not on file   Social Determinants of Health   Financial Resource Strain:   . Difficulty of Paying Living Expenses:   Food Insecurity:   . Worried About Charity fundraiser in the Last Year:   . Arboriculturist in the Last Year:   Transportation Needs:   . Film/video editor (Medical):   Marland Kitchen Lack of Transportation (Non-Medical):   Physical Activity:   . Days of Exercise per Week:   . Minutes of Exercise per Session:   Stress:   . Feeling of Stress :   Social Connections:   . Frequency of Communication with Friends and Family:   . Frequency of Social Gatherings with Friends and Family:   . Attends Religious Services:   . Active Member of Clubs or Organizations:   . Attends English as a second language teacher Meetings:   Marland Kitchen Marital Status:     Outpatient Encounter Medications as of 05/12/2020  Medication Sig  . Alcohol Swabs (B-D SINGLE USE SWABS REGULAR) PADS Clean test area with swab before testing.  Marland Kitchen alendronate (FOSAMAX) 70 MG tablet Take 1 tablet (70 mg total) by mouth once a week. Take with a full glass of water on an empty stomach.  . AMBULATORY NON FORMULARY MEDICATION Rollator with extra large seat. Fax to [facility] [phone]  . AMBULATORY NON FORMULARY MEDICATION Dx DM E11.9 - DM shoes - Fax to [phone]  . amLODipine (NORVASC) 10 MG tablet Take 1 tablet (10 mg total) by mouth daily.  Marland Kitchen ammonium lactate (LAC-HYDRIN) 12 % lotion Apply 1 application topically daily. After showers  . aspirin EC 81 MG tablet Take 81 mg by mouth daily.  Marland Kitchen atorvastatin (LIPITOR) 20 MG tablet Take 1 tablet (20 mg total) by mouth daily.  . baclofen (LIORESAL) 10 MG tablet TAKE 1 TABLET BY MOUTH 2  TIMES DAILY AS DIRECTED  . Blood Glucose Calibration (PRODIGY CONTROL SOLUTION) Low SOLN Use as directed  . Blood Glucose Monitoring Suppl (PRODIGY AUTOCODE BLOOD GLUCOSE) w/Device KIT 1 strip by Does not apply route daily.  Marland Kitchen buPROPion (WELLBUTRIN XL) 150 MG 24 hr tablet TAKE 1 TABLET BY MOUTH  TWICE DAILY AT 8AM AND 3PM  . DULoxetine (CYMBALTA) 60 MG capsule Take 1 capsule (60 mg total) by mouth daily.  . furosemide (LASIX) 20 MG tablet Take 1 tablet (20 mg total) by mouth daily as needed for edema.  Marland Kitchen glucose blood (PRODIGY NO CODING BLOOD GLUC) test strip Use as instructed  . metFORMIN (GLUCOPHAGE) 1000 MG tablet Take 1 tablet (1,000 mg total) by mouth 2 (two) times daily.  . metoprolol succinate (TOPROL-XL) 100 MG 24 hr tablet 1.5 tabs PO QD.  For Blood pressure  . mirtazapine (REMERON) 15 MG tablet Take 15 mg by mouth at bedtime.   . Prodigy Twist Top Lancets 28G MISC Use as directed.  . zolpidem (AMBIEN) 5 MG tablet TAKE 1 TABLET BY MOUTH AT BEDTIME AS NEEDED FOR SLEEP   No  facility-administered encounter medications on file as of 05/12/2020.    Activities of Daily Living In your present state of health, do you have any difficulty performing the following activities: 05/12/2020  Hearing? Y  Vision? Y  Difficulty concentrating or making decisions? Y  Walking or  climbing stairs? N  Dressing or bathing? Y  Doing errands, shopping? Y  Preparing Food and eating ? Y  Using the Toilet? N  In the past six months, have you accidently leaked urine? N  Do you have problems with loss of bowel control? N  Managing your Medications? N  Managing your Finances? N  Housekeeping or managing your Housekeeping? N  Some recent data might be hidden    Patient Care Team: Hali Marry, MD as PCP - General (Family Medicine)    Assessment:   This is a routine wellness examination for [place].  Exercise Activities and Dietary recommendations Current Exercise Habits: The patient does not participate in regular exercise at present  Goals   Goal to lose up to 50 pounds this coming year.      Fall Risk Fall Risk  05/12/2020 01/10/2019 05/17/2018 04/27/2017 04/25/2016  Falls in the past year? 1 1 Yes No Yes  Number falls in past yr: _0 - 2 or more  Injury with Fall? 1 1 No - No  Risk Factor Category  - - - - High Fall Risk  Risk for fall due to : History of fall(s);Impaired balance/gait;Impaired mobility History of fall(s);Impaired balance/gait Impaired mobility - -  Follow up Falls evaluation completed Falls prevention discussed Falls prevention discussed - Education provided;Falls prevention discussed   Is the patient's home free of loose throw rugs in walkways, pet beds, electrical cords, etc?   yes      Grab bars in the bathroom? yes      Handrails on the stairs?   no stairs      Adequate lighting?   no  Patient rating of health (0-10) scale: 5   Depression Screen PHQ 2/9 Scores 05/12/2020 12/17/2019 06/26/2019 02/27/2019  PHQ - 2 Score _1 PHQ- 9 Score _2 Cognitive Function     6CIT Screen 05/12/2020 04/27/2017  What Year? 0 points 0 points  What month? 0 points 0 points  What time? 0 points 0 points  Count back from 20 0 points 0 points  Months in reverse 2 points 0 points  Repeat phrase 2 points 0 points  Total Score 4 0    Immunization History  Administered Date(s) Administered  . Influenza, High Dose Seasonal PF 07/11/2016, 07/25/2017, 09/13/2018, 09/03/2019  . Influenza,inj,Quad PF,6+ Mos 06/17/2014, 06/02/2015  . Influenza-Unspecified 08/10/2013  . PFIZER SARS-COV-2 Vaccination 12/01/2019, 12/31/2019  . Pneumococcal Conjugate-13 08/18/2015  . Pneumococcal Polysaccharide-23 08/02/2008, 11/16/2018  . Td 03/26/2008     Screening Tests Health Maintenance  Topic Date Due  . INFLUENZA VACCINE  05/10/2020  . FOOT EXAM  05/19/2020 (Originally 05/18/2019)  . OPHTHALMOLOGY EXAM  05/19/2020 (Originally 12/16/2017)  . URINE MICROALBUMIN  05/19/2020 (Originally 07/25/2018)  . TETANUS/TDAP  05/19/2020 (Originally 03/26/2018)  . Hepatitis C Screening  05/26/2020 (Originally March 17, 1941)  . HEMOGLOBIN A1C  06/18/2020  . DEXA SCAN  Completed  . COVID-19 Vaccine  Completed  . PNA vac Low Risk Adult  Completed    Cancer Screenings: Lung: Low Dose CT Chest recommended if Age 61-80 years, 30 pack-year currently smoking OR have quit w/in 15years. Patient does not qualify. Breast:  Up to date on Mammogram? No   Up to date of Bone Density/Dexa? No Colorectal: Due 02/2023  Additional Screenings:  Hepatitis C Screening:      Plan:  Will order Mammogram and DEXA scan today. Patient wishes to continue mammogram  screening.  Referral to [facility] for evaluation for foot brace Discussed moving fall risks from walkways Discussed increasing the lighting in the home to prevent falls    I have personally reviewed and noted the following in the patient's chart:   . Medical and social history . Use of alcohol, tobacco or illicit  drugs  . Current medications and supplements . Functional ability and status . Nutritional status . Physical activity . Advanced directives . List of other physicians . Hospitalizations, surgeries, and ER visits in previous 12 months . Vitals . Screenings to include cognitive, depression, and falls . Referrals and appointments  In addition, I have reviewed and discussed with patient certain preventive protocols, quality metrics, and best practice recommendations. A written personalized care plan for preventive services as well as general preventive health recommendations were provided to patient.    This visit was conducted virtually in the setting of the [place] pandemic.    Orma Render, NP  05/12/2020

## 2020-05-13 DIAGNOSIS — L308 Other specified dermatitis: Secondary | ICD-10-CM | POA: Diagnosis not present

## 2020-05-13 DIAGNOSIS — L299 Pruritus, unspecified: Secondary | ICD-10-CM | POA: Diagnosis not present

## 2020-05-13 DIAGNOSIS — L659 Nonscarring hair loss, unspecified: Secondary | ICD-10-CM | POA: Diagnosis not present

## 2020-05-25 ENCOUNTER — Encounter: Payer: Self-pay | Admitting: Gastroenterology

## 2020-05-28 ENCOUNTER — Ambulatory Visit (INDEPENDENT_AMBULATORY_CARE_PROVIDER_SITE_OTHER): Payer: Medicare PPO

## 2020-05-28 ENCOUNTER — Ambulatory Visit: Payer: Medicare PPO | Admitting: Podiatry

## 2020-05-28 ENCOUNTER — Other Ambulatory Visit: Payer: Self-pay

## 2020-05-28 DIAGNOSIS — M21372 Foot drop, left foot: Secondary | ICD-10-CM

## 2020-05-28 DIAGNOSIS — W19XXXS Unspecified fall, sequela: Secondary | ICD-10-CM

## 2020-05-28 DIAGNOSIS — S90212A Contusion of left great toe with damage to nail, initial encounter: Secondary | ICD-10-CM

## 2020-05-28 DIAGNOSIS — S99922S Unspecified injury of left foot, sequela: Secondary | ICD-10-CM | POA: Diagnosis not present

## 2020-05-28 DIAGNOSIS — R2681 Unsteadiness on feet: Secondary | ICD-10-CM

## 2020-05-31 NOTE — Progress Notes (Signed)
Subjective:   Patient ID: Carolyn Lambert, female   DOB: 79 y.o.   MRN: [id number]   HPI 79 year old female presents the office today for concerns of left big toe injury which happened about 2 months ago.  She states that is doing better but she still getting aching sensation and she without further evaluation of the area.  She does have a history of dropfoot and she used to wear a brace however she states that she has lost and she has not been wearing the brace which is resulted her to fall several times.  She denies any other injury with the fall.  She has no other concerns today.  No open lesions.  Last A1c was 7.1 on December 17, 2019  Review of Systems  All other systems reviewed and are negative.  Past Medical History:  Diagnosis Date  . Anxiety and depression   . Arthritis   . Chronic fatigue syndrome   . Diabetes mellitus   . DVT (deep venous thrombosis) ([place])    s/p knee replacmet.   . Fibromyalgia   . GERD (gastroesophageal reflux disease)   . Hypercholesteremia   . Hypertension   . Major depressive disorder 03/20/2012  . Obesity   . Peripheral edema   . Sinusitis July 2013    Past Surgical History:  Procedure Laterality Date  . ABDOMINAL HYSTERECTOMY    . BACK SURGERY     x 2  . CATARACT EXTRACTION W/ INTRAOCULAR LENS & ANTERIOR VITRECTOMY Left 11/02/26  . CATARACT EXTRACTION W/ INTRAOCULAR LENS IMPLANT Right 09/28/2016  . ORIF ANKLE FRACTURE Right 11/13/2018   [facility]  . REPLACEMENT TOTAL KNEE Bilateral    2 on left and 1 on right, Dr Wynelle Link  . ROTATOR CUFF REPAIR Right   . TONSILLECTOMY       Current Outpatient Medications:  .  Alcohol Swabs (B-D SINGLE USE SWABS REGULAR) PADS, Clean test area with swab before testing., Disp: 1 each, Rfl: 3 .  alendronate (FOSAMAX) 70 MG tablet, Take 1 tablet (70 mg total) by mouth once a week. Take with a full glass of water on an empty stomach., Disp: 12 tablet, Rfl: 3 .  AMBULATORY NON FORMULARY MEDICATION, Rollator  with extra large seat. Fax to [facility] [phone], Disp: 1 each, Rfl: PRN .  AMBULATORY NON FORMULARY MEDICATION, Dx DM E11.9 - DM shoes - Fax to [phone], Disp: 1 each, Rfl: 0 .  amLODipine (NORVASC) 10 MG tablet, Take 1 tablet (10 mg total) by mouth daily., Disp: 90 tablet, Rfl: 2 .  ammonium lactate (LAC-HYDRIN) 12 % lotion, Apply 1 application topically daily. After showers, Disp: , Rfl:  .  aspirin EC 81 MG tablet, Take 81 mg by mouth daily., Disp: , Rfl:  .  atorvastatin (LIPITOR) 20 MG tablet, Take 1 tablet (20 mg total) by mouth daily., Disp: 90 tablet, Rfl: 2 .  baclofen (LIORESAL) 10 MG tablet, TAKE 1 TABLET BY MOUTH 2  TIMES DAILY AS DIRECTED, Disp: 180 tablet, Rfl: 1 .  Blood Glucose Calibration (PRODIGY CONTROL SOLUTION) Low SOLN, Use as directed, Disp: 1 each, Rfl: 3 .  Blood Glucose Monitoring Suppl (PRODIGY AUTOCODE BLOOD GLUCOSE) w/Device KIT, 1 strip by Does not apply route daily., Disp: 1 kit, Rfl: 0 .  buPROPion (WELLBUTRIN XL) 150 MG 24 hr tablet, TAKE 1 TABLET BY MOUTH  TWICE DAILY AT 8AM AND 3PM, Disp: 180 tablet, Rfl: 1 .  DULoxetine (CYMBALTA) 60 MG capsule, Take 1 capsule (60 mg  total) by mouth daily., Disp: 90 capsule, Rfl: 2 .  furosemide (LASIX) 20 MG tablet, Take 1 tablet (20 mg total) by mouth daily as needed for edema., Disp: 20 tablet, Rfl: 3 .  glucose blood (PRODIGY NO CODING BLOOD GLUC) test strip, Use as instructed, Disp: 100 each, Rfl: 12 .  metFORMIN (GLUCOPHAGE) 1000 MG tablet, Take 1 tablet (1,000 mg total) by mouth 2 (two) times daily., Disp: 180 tablet, Rfl: 3 .  metoprolol succinate (TOPROL-XL) 100 MG 24 hr tablet, 1.5 tabs PO QD.  For Blood pressure, Disp: 270 tablet, Rfl: 1 .  mirtazapine (REMERON) 15 MG tablet, Take 15 mg by mouth at bedtime. , Disp: , Rfl:  .  Prodigy Twist Top Lancets 28G MISC, Use as directed., Disp: 1 each, Rfl: 3 .  zolpidem (AMBIEN) 5 MG tablet, TAKE 1 TABLET BY MOUTH AT BEDTIME AS NEEDED FOR SLEEP, Disp: 30  tablet, Rfl: 0  Allergies  Allergen Reactions  . Losartan Other (See Comments) and Hives    Cough  Cough   . Gabapentin Other (See Comments)    dizziness  . Lisinopril Other (See Comments)    Dry cough  . Oxycodone Nausea And Vomiting  . Oxycodone-Aspirin Nausea And Vomiting       Objective:  Physical Exam  General: AAO x3, NAD  Dermatological: Skin is warm, dry and supple bilateral.  There are no open sores, no preulcerative lesions, no rash or signs of infection present.  Vascular: Dorsalis Pedis artery and Posterior Tibial artery pedal pulses are palpable bilateral with immedate capillary fill time.  Chronic edema present bilaterally.  There is no pain with calf compression, swelling, warmth, erythema.   Neruologic: Sensation decreased in the left side with Semmes Weinstein monofilament compared to the contralateral extremity.  Musculoskeletal: Mild discomfort along the hallux as well as to the forefoot area but no specific area of pinpoint tenderness.  Dropfoot is present on the left side.       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  There is no evidence of acute fracture or stress fracture identified today. -For now we will try left ankle brace was dispensed to see if this will help get some stability to the ankle.  Ultimately she needs a new AFO for dropfoot.  I will have her follow-up with Liliane Channel our ped orthotist in order to be measured for this.   Trula Slade DPM

## 2020-06-04 ENCOUNTER — Other Ambulatory Visit: Payer: Medicare PPO | Admitting: Orthotics

## 2020-06-04 DIAGNOSIS — E119 Type 2 diabetes mellitus without complications: Secondary | ICD-10-CM | POA: Diagnosis not present

## 2020-06-04 DIAGNOSIS — H524 Presbyopia: Secondary | ICD-10-CM | POA: Diagnosis not present

## 2020-06-04 LAB — HM DIABETES EYE EXAM

## 2020-06-05 ENCOUNTER — Other Ambulatory Visit: Payer: Medicare PPO | Admitting: Orthotics

## 2020-06-16 ENCOUNTER — Ambulatory Visit: Payer: Medicare PPO | Admitting: Family Medicine

## 2020-06-16 ENCOUNTER — Other Ambulatory Visit: Payer: Self-pay

## 2020-06-16 ENCOUNTER — Encounter: Payer: Self-pay | Admitting: Family Medicine

## 2020-06-16 DIAGNOSIS — Z23 Encounter for immunization: Secondary | ICD-10-CM | POA: Diagnosis not present

## 2020-06-16 DIAGNOSIS — F5101 Primary insomnia: Secondary | ICD-10-CM | POA: Diagnosis not present

## 2020-06-16 DIAGNOSIS — E119 Type 2 diabetes mellitus without complications: Secondary | ICD-10-CM

## 2020-06-16 DIAGNOSIS — E7800 Pure hypercholesterolemia, unspecified: Secondary | ICD-10-CM | POA: Diagnosis not present

## 2020-06-16 DIAGNOSIS — I10 Essential (primary) hypertension: Secondary | ICD-10-CM | POA: Diagnosis not present

## 2020-06-16 LAB — POCT GLYCOSYLATED HEMOGLOBIN (HGB A1C): Hemoglobin A1C: 8.1 % — AB (ref 4.0–5.6)

## 2020-06-16 MED ORDER — ZOLPIDEM TARTRATE 5 MG PO TABS: ORAL_TABLET | ORAL | 0 refills | Status: DC

## 2020-06-16 MED ORDER — SITAGLIPTIN PHOSPHATE 50 MG PO TABS: 50.0000 mg | ORAL_TABLET | Freq: Every day | ORAL | 2 refills | Status: DC

## 2020-06-16 MED ORDER — SPIRONOLACTONE 25 MG PO TABS: 25.0000 mg | ORAL_TABLET | Freq: Every day | ORAL | 2 refills | Status: DC

## 2020-06-16 MED ORDER — TETANUS-DIPHTH-ACELL PERTUSSIS 5-2.5-18.5 LF-MCG/0.5 IM SUSP: 0.5000 mL | Freq: Once | INTRAMUSCULAR | 0 refills | Status: AC

## 2020-06-16 MED ORDER — FUROSEMIDE 20 MG PO TABS: 20.0000 mg | ORAL_TABLET | Freq: Every day | ORAL | 3 refills | Status: DC | PRN

## 2020-06-16 NOTE — Progress Notes (Signed)
Established Patient Office Visit  Subjective:  Patient ID: Carolyn Lambert, female    DOB: 1940-10-26  Age: 79 y.o. MRN: [id number]  CC:  Chief Complaint  Patient presents with  . Hypertension  . Diabetes    HPI Carolyn Lambert presents for follow-up.  She is here today with her husband.  Hypertension- Pt denies chest pain, SOB, dizziness, or heart palpitations.  Taking meds as directed w/o problems.  Denies medication side effects.  She reports her blood pressures have been high at at home as well she is even seen 180s before.  He denies any chest pain but does have persistent shortness of breath.  Diabetes - no hypoglycemic events. No wounds or sores that are not healing well. No increased thirst or urination. Checking glucose at home. Taking medications as prescribed without any side effects.  She has been seeing Dr. Earleen Newport, podiatry and they are working on getting her some diabetic shoes.  Not been wearing her oxygen at night and using the concentrator.  Admits she is gained a lot of weight this year.  She feels like she has been stressed she has been trying to move back into the [place] area to move out of her daughter's home she has been very disappointed with her son in particular.  She has been working with dermatology and says her scalp finally feels better.  Past Medical History:  Diagnosis Date  . Anxiety and depression   . Arthritis   . Chronic fatigue syndrome   . Diabetes mellitus   . DVT (deep venous thrombosis) ([place])    s/p knee replacmet.   . Fibromyalgia   . GERD (gastroesophageal reflux disease)   . Hypercholesteremia   . Hypertension   . Major depressive disorder 03/20/2012  . Obesity   . Peripheral edema   . Sinusitis July 2013    Past Surgical History:  Procedure Laterality Date  . ABDOMINAL HYSTERECTOMY    . BACK SURGERY     x 2  . CATARACT EXTRACTION W/ INTRAOCULAR LENS & ANTERIOR VITRECTOMY Left 11/02/26  . CATARACT EXTRACTION  W/ INTRAOCULAR LENS IMPLANT Right 09/28/2016  . ORIF ANKLE FRACTURE Right 11/13/2018   [facility]  . REPLACEMENT TOTAL KNEE Bilateral    2 on left and 1 on right, Dr Wynelle Link  . ROTATOR CUFF REPAIR Right   . TONSILLECTOMY      Family History  Problem Relation Age of Onset  . Hypertension Mother   . Atrial fibrillation Mother   . Diabetes Mother   . Hyperlipidemia Mother   . Cancer Father        lung  . Diabetes type II Father   . Aneurysm Father        abdominal  . Depression Daughter   . Colon cancer Neg Hx     Social History   Socioeconomic History  . Marital status: Married    Spouse name: Not on file  . Number of children: 5  . Years of education: Not on file  . Highest education level: Not on file  Occupational History  . Occupation: retired  Tobacco Use  . Smoking status: Never Smoker  . Smokeless tobacco: Never Used  Vaping Use  . Vaping Use: Never used  Substance and Sexual Activity  . Alcohol use: Yes    Comment: Occasionally, wine  . Drug use: No    Comment: Caffeine: Coffee -2-3  cups per day.   Marland Kitchen Sexual activity: Not Currently  Other Topics Concern  .  Not on file  Social History Narrative  . Not on file   Social Determinants of Health   Financial Resource Strain:   . Difficulty of Paying Living Expenses: Not on file  Food Insecurity:   . Worried About Charity fundraiser in the Last Year: Not on file  . Ran Out of Food in the Last Year: Not on file  Transportation Needs:   . Lack of Transportation (Medical): Not on file  . Lack of Transportation (Non-Medical): Not on file  Physical Activity:   . Days of Exercise per Week: Not on file  . Minutes of Exercise per Session: Not on file  Stress:   . Feeling of Stress : Not on file  Social Connections:   . Frequency of Communication with Friends and Family: Not on file  . Frequency of Social Gatherings with Friends and Family: Not on file  . Attends Religious Services: Not on file  . Active Member  of Clubs or Organizations: Not on file  . Attends Archivist Meetings: Not on file  . Marital Status: Not on file  Intimate Partner Violence:   . Fear of Current or Ex-Partner: Not on file  . Emotionally Abused: Not on file  . Physically Abused: Not on file  . Sexually Abused: Not on file    Outpatient Medications Prior to Visit  Medication Sig Dispense Refill  . Alcohol Swabs (B-D SINGLE USE SWABS REGULAR) PADS Clean test area with swab before testing. 1 each 3  . alendronate (FOSAMAX) 70 MG tablet Take 1 tablet (70 mg total) by mouth once a week. Take with a full glass of water on an empty stomach. 12 tablet 3  . AMBULATORY NON FORMULARY MEDICATION Dx DM E11.9 - DM shoes - Fax to [phone] 1 each 0  . amLODipine (NORVASC) 10 MG tablet Take 1 tablet (10 mg total) by mouth daily. 90 tablet 2  . ammonium lactate (LAC-HYDRIN) 12 % lotion Apply 1 application topically daily. After showers    . aspirin EC 81 MG tablet Take 81 mg by mouth daily.    Marland Kitchen atorvastatin (LIPITOR) 20 MG tablet Take 1 tablet (20 mg total) by mouth daily. 90 tablet 2  . baclofen (LIORESAL) 10 MG tablet TAKE 1 TABLET BY MOUTH 2  TIMES DAILY AS DIRECTED 180 tablet 1  . Blood Glucose Calibration (PRODIGY CONTROL SOLUTION) Low SOLN Use as directed 1 each 3  . Blood Glucose Monitoring Suppl (PRODIGY AUTOCODE BLOOD GLUCOSE) w/Device KIT 1 strip by Does not apply route daily. 1 kit 0  . buPROPion (WELLBUTRIN XL) 150 MG 24 hr tablet TAKE 1 TABLET BY MOUTH  TWICE DAILY AT 8AM AND 3PM 180 tablet 1  . clobetasol cream (TEMOVATE) 0.05 % Apply topically.    . DULoxetine (CYMBALTA) 60 MG capsule Take 1 capsule (60 mg total) by mouth daily. 90 capsule 2  . glucose blood (PRODIGY NO CODING BLOOD GLUC) test strip Use as instructed 100 each 12  . metFORMIN (GLUCOPHAGE) 1000 MG tablet Take 1 tablet (1,000 mg total) by mouth 2 (two) times daily. 180 tablet 3  . metoprolol succinate (TOPROL-XL) 100 MG 24 hr tablet 1.5 tabs PO  QD.  For Blood pressure 270 tablet 1  . mirtazapine (REMERON) 15 MG tablet Take 15 mg by mouth at bedtime.     . Prodigy Twist Top Lancets 28G MISC Use as directed. 1 each 3  . furosemide (LASIX) 20 MG tablet Take 1 tablet (20 mg total)  by mouth daily as needed for edema. 20 tablet 3  . zolpidem (AMBIEN) 5 MG tablet TAKE 1 TABLET BY MOUTH AT BEDTIME AS NEEDED FOR SLEEP 30 tablet 0  . AMBULATORY NON FORMULARY MEDICATION Rollator with extra large seat. Fax to [place] [phone] 1 each PRN   No facility-administered medications prior to visit.    Allergies  Allergen Reactions  . Losartan Other (See Comments) and Hives    Cough  Cough   . Gabapentin Other (See Comments)    dizziness  . Lisinopril Other (See Comments)    Dry cough  . Oxycodone Nausea And Vomiting  . Oxycodone-Aspirin Nausea And Vomiting    ROS Review of Systems    Objective:    Physical Exam Constitutional:      Appearance: She is well-developed.  HENT:     Head: Normocephalic and atraumatic.  Cardiovascular:     Rate and Rhythm: Normal rate and regular rhythm.     Heart sounds: Normal heart sounds.  Pulmonary:     Effort: Pulmonary effort is normal.     Breath sounds: Normal breath sounds.  Skin:    General: Skin is warm and dry.  Neurological:     Mental Status: She is alert and oriented to person, place, and time.  Psychiatric:        Behavior: Behavior normal.     BP (!) 178/76   Pulse 74   Ht _0  (1.702 m)   Wt 269 lb (122 kg)   SpO2 96%   BMI 42.13 kg/m  Wt Readings from Last 3 Encounters:  06/16/20 269 lb (122 kg)  05/12/20 271 lb 4.8 oz (123.1 kg)  12/17/19 275 lb (124.7 kg)     Health Maintenance Due  Topic Date Due  . Hepatitis C Screening  Never done  . TETANUS/TDAP  03/26/2018  . URINE MICROALBUMIN  07/25/2018  . FOOT EXAM  05/18/2019    There are no preventive care reminders to display for this patient.  Lab Results  Component Value Date   TSH 1.66  12/17/2019   Lab Results  Component Value Date   WBC 8.8 12/17/2019   HGB 13.4 12/17/2019   HCT 42.1 12/17/2019   MCV 91.9 12/17/2019   PLT 279 12/17/2019   Lab Results  Component Value Date   NA 145 12/17/2019   K 4.5 12/17/2019   CO2 26 12/17/2019   GLUCOSE 165 (H) 12/17/2019   BUN 24 12/17/2019   CREATININE 1.36 (H) 12/17/2019   BILITOT 0.3 12/17/2019   ALKPHOS 81 05/08/2017   AST 10 12/17/2019   ALT 9 12/17/2019   PROT 6.6 12/17/2019   ALBUMIN 4.0 05/08/2017   CALCIUM 9.5 12/17/2019   ANIONGAP 6 07/22/2016   GFR 87.26 07/29/2013   Lab Results  Component Value Date   CHOL 167 05/30/2018   Lab Results  Component Value Date   HDL 59 05/30/2018   Lab Results  Component Value Date   LDLCALC 84 05/30/2018   Lab Results  Component Value Date   TRIG 144 05/30/2018   Lab Results  Component Value Date   CHOLHDL 2.8 05/30/2018   Lab Results  Component Value Date   HGBA1C 8.1 (A) 06/16/2020      Assessment & Plan:   Problem List Items Addressed This Visit      Cardiovascular and Mediastinum   Essential hypertension    Blood pressure not well controlled currently on amlodipine 10 mg daily, metoprolol 100 mg,  and furosemide daily as needed.  We will add spironolactone 25 mg daily to her current regimen will need to recheck potassium in 1 to 2 weeks.      Relevant Medications   furosemide (LASIX) 20 MG tablet   spironolactone (ALDACTONE) 25 MG tablet     Endocrine   Diabetes mellitus, type II ([place])    Uncontrolled.  Hemoglobin A1c jumped up to 8.1 which is pretty significant.  Really encouraged her to make sure that she is on track as far as healthy diet choices she is also been extremely sedentary and we discussed setting small goals such as walking for 5 minutes a day nonstop to really build up her endurance.  Start Januvia.  Follow-up in 3 months.      Relevant Medications   sitaGLIPtin (JANUVIA) 50 MG tablet   Other Relevant Orders   POCT  glycosylated hemoglobin (Hb A1C) (Completed)     Other   Pure hypercholesterolemia    Currently on Lipitor and tolerating well.  Due to repeat lipids.      Relevant Medications   furosemide (LASIX) 20 MG tablet   spironolactone (ALDACTONE) 25 MG tablet   Other Relevant Orders   Lipid panel   Morbid obesity ([place]) - Primary    BMI > 40.  Not well controlled. Has gained weight over this yr. she reports that she has been really stressed for the last year and she and her husband are trying to move back to [place] and move out of her daughter's home.  She has been stress eating.  Just really encouraged her to work on healthy food choices and encouraged regular activity.      Relevant Medications   sitaGLIPtin (JANUVIA) 50 MG tablet   Insomnia   Relevant Medications   zolpidem (AMBIEN) 5 MG tablet    Other Visit Diagnoses    Need for immunization against influenza       Relevant Orders   Flu Vaccine QUAD High Dose(Fluad) (Completed)      Meds ordered this encounter  Medications  . Tdap (BOOSTRIX) 5-2.5-18.5 LF-MCG/0.5 injection    Sig: Inject 0.5 mLs into the muscle once for 1 dose.    Dispense:  0.5 mL    Refill:  0  . zolpidem (AMBIEN) 5 MG tablet    Sig: TAKE 1 TABLET BY MOUTH AT BEDTIME AS NEEDED FOR SLEEP    Dispense:  30 tablet    Refill:  0  . furosemide (LASIX) 20 MG tablet    Sig: Take 1 tablet (20 mg total) by mouth daily as needed for edema.    Dispense:  20 tablet    Refill:  3  . spironolactone (ALDACTONE) 25 MG tablet    Sig: Take 1 tablet (25 mg total) by mouth daily.    Dispense:  30 tablet    Refill:  2  . sitaGLIPtin (JANUVIA) 50 MG tablet    Sig: Take 1 tablet (50 mg total) by mouth daily.    Dispense:  30 tablet    Refill:  2    Follow-up: Return in about 2 weeks (around 06/30/2020) for Hypertension with nurse.    Beatrice Lecher, MD

## 2020-06-16 NOTE — Patient Instructions (Addendum)
We will add spironolactone to your daily regimen to help control blood pressure.  You will need to have your potassium rechecked in 1 to 2 weeks.  Can try adding Januvia to your daily regimen as well to help control your blood sugars.

## 2020-06-16 NOTE — Assessment & Plan Note (Addendum)
Blood pressure not well controlled currently on amlodipine 10 mg daily, metoprolol 100 mg, and furosemide daily as needed.  We will add spironolactone 25 mg daily to her current regimen will need to recheck potassium in 1 to 2 weeks.

## 2020-06-16 NOTE — Assessment & Plan Note (Signed)
BMI > 40.  Not well controlled. Has gained weight over this yr. she reports that she has been really stressed for the last year and she and her husband are trying to move back to [place] and move out of her daughter's home.  She has been stress eating.  Just really encouraged her to work on healthy food choices and encouraged regular activity.

## 2020-06-17 ENCOUNTER — Encounter: Payer: Self-pay | Admitting: Family Medicine

## 2020-06-17 NOTE — Assessment & Plan Note (Signed)
Currently on Lipitor and tolerating well.  Due to repeat lipids.

## 2020-06-17 NOTE — Assessment & Plan Note (Addendum)
Uncontrolled.  Hemoglobin A1c jumped up to 8.1 which is pretty significant.  Really encouraged her to make sure that she is on track as far as healthy diet choices she is also been extremely sedentary and we discussed setting small goals such as walking for 5 minutes a day nonstop to really build up her endurance.  Start Januvia.  Follow-up in 3 months.

## 2020-06-19 ENCOUNTER — Other Ambulatory Visit: Payer: Medicare PPO | Admitting: Orthotics

## 2020-06-19 ENCOUNTER — Other Ambulatory Visit: Payer: Self-pay

## 2020-06-23 ENCOUNTER — Telehealth: Payer: Self-pay

## 2020-06-23 NOTE — Telephone Encounter (Signed)
Patient called, she is going to the dentist and is going to need some premedication. She has not been to dentist in a long time, so she is not sure if Dr Madilyn Fireman has ever written this for her.    RX pended, please send if OK

## 2020-06-24 NOTE — Telephone Encounter (Signed)
She should not need premedication unless they are actually doing dental work.  Is this for routine cleaning or is this for a procedure?  Guidelines have been updated and they are no longer recommending antibiotics for routine dental cleaning.  Please let me know.  So she can let us know the name of her dentist that would be great.

## 2020-06-24 NOTE — Telephone Encounter (Signed)
Called and left pt msg of Dr Gardiner Ramus note. Asked she call back with what is being done and the name of her dentist

## 2020-06-25 DIAGNOSIS — Z23 Encounter for immunization: Secondary | ICD-10-CM | POA: Diagnosis not present

## 2020-06-26 DIAGNOSIS — Z811 Family history of alcohol abuse and dependence: Secondary | ICD-10-CM | POA: Diagnosis not present

## 2020-06-26 DIAGNOSIS — F4323 Adjustment disorder with mixed anxiety and depressed mood: Secondary | ICD-10-CM | POA: Diagnosis not present

## 2020-06-26 DIAGNOSIS — E785 Hyperlipidemia, unspecified: Secondary | ICD-10-CM | POA: Diagnosis not present

## 2020-06-26 DIAGNOSIS — M810 Age-related osteoporosis without current pathological fracture: Secondary | ICD-10-CM | POA: Diagnosis not present

## 2020-06-26 DIAGNOSIS — Z7982 Long term (current) use of aspirin: Secondary | ICD-10-CM | POA: Diagnosis not present

## 2020-06-26 DIAGNOSIS — Z9981 Dependence on supplemental oxygen: Secondary | ICD-10-CM | POA: Diagnosis not present

## 2020-06-26 DIAGNOSIS — Z6841 Body Mass Index (BMI) 40.0 and over, adult: Secondary | ICD-10-CM | POA: Diagnosis not present

## 2020-06-26 DIAGNOSIS — Z9181 History of falling: Secondary | ICD-10-CM | POA: Diagnosis not present

## 2020-06-26 DIAGNOSIS — G4700 Insomnia, unspecified: Secondary | ICD-10-CM | POA: Diagnosis not present

## 2020-06-26 DIAGNOSIS — Z7983 Long term (current) use of bisphosphonates: Secondary | ICD-10-CM | POA: Diagnosis not present

## 2020-06-26 DIAGNOSIS — F411 Generalized anxiety disorder: Secondary | ICD-10-CM | POA: Diagnosis not present

## 2020-06-26 DIAGNOSIS — Z7984 Long term (current) use of oral hypoglycemic drugs: Secondary | ICD-10-CM | POA: Diagnosis not present

## 2020-06-26 DIAGNOSIS — R609 Edema, unspecified: Secondary | ICD-10-CM | POA: Diagnosis not present

## 2020-06-26 DIAGNOSIS — R32 Unspecified urinary incontinence: Secondary | ICD-10-CM | POA: Diagnosis not present

## 2020-06-26 DIAGNOSIS — G8929 Other chronic pain: Secondary | ICD-10-CM | POA: Diagnosis not present

## 2020-06-26 DIAGNOSIS — Z833 Family history of diabetes mellitus: Secondary | ICD-10-CM | POA: Diagnosis not present

## 2020-06-26 DIAGNOSIS — Z604 Social exclusion and rejection: Secondary | ICD-10-CM | POA: Diagnosis not present

## 2020-06-26 DIAGNOSIS — F329 Major depressive disorder, single episode, unspecified: Secondary | ICD-10-CM | POA: Diagnosis not present

## 2020-06-26 DIAGNOSIS — E119 Type 2 diabetes mellitus without complications: Secondary | ICD-10-CM | POA: Diagnosis not present

## 2020-06-26 DIAGNOSIS — R0902 Hypoxemia: Secondary | ICD-10-CM | POA: Diagnosis not present

## 2020-06-30 ENCOUNTER — Ambulatory Visit (INDEPENDENT_AMBULATORY_CARE_PROVIDER_SITE_OTHER): Payer: Medicare PPO | Admitting: Family Medicine

## 2020-06-30 VITALS — BP 138/72 | HR 65

## 2020-06-30 DIAGNOSIS — E7800 Pure hypercholesterolemia, unspecified: Secondary | ICD-10-CM | POA: Diagnosis not present

## 2020-06-30 DIAGNOSIS — I10 Essential (primary) hypertension: Secondary | ICD-10-CM

## 2020-06-30 LAB — LIPID PANEL
Cholesterol: 154 mg/dL
HDL: 51 mg/dL
LDL Cholesterol (Calc): 75 mg/dL
Non-HDL Cholesterol (Calc): 103 mg/dL
Total CHOL/HDL Ratio: 3 (calc)
Triglycerides: 186 mg/dL — ABNORMAL HIGH

## 2020-06-30 NOTE — Telephone Encounter (Signed)
Spoke with pt who states that she is having a routine dental cleaning and that the dentist sent in RX for amoxicillin.  No further needs from PCP.  Charyl Bigger, CMA

## 2020-06-30 NOTE — Progress Notes (Signed)
Established Patient Office Visit  Subjective:  Patient ID: Carolyn Lambert, female    DOB: [birth year]  Age: 79 y.o. MRN: [id number]  CC:  Chief Complaint  Patient presents with  . Hypertension    HPI Carolyn Lambert presents for hypertension. Denies chest pain, shortness of breath or dizziness.   Past Medical History:  Diagnosis Date  . Anxiety and depression   . Arthritis   . Chronic fatigue syndrome   . Diabetes mellitus   . DVT (deep venous thrombosis) ([place])    s/p knee replacmet.   . Fibromyalgia   . GERD (gastroesophageal reflux disease)   . Hypercholesteremia   . Hypertension   . Major depressive disorder 03/20/2012  . Obesity   . Peripheral edema   . Sinusitis July 2013    Past Surgical History:  Procedure Laterality Date  . ABDOMINAL HYSTERECTOMY    . BACK SURGERY     x 2  . CATARACT EXTRACTION W/ INTRAOCULAR LENS & ANTERIOR VITRECTOMY Left 11/02/26  . CATARACT EXTRACTION W/ INTRAOCULAR LENS IMPLANT Right 09/28/2016  . ORIF ANKLE FRACTURE Right 11/13/2018   [facility]  . REPLACEMENT TOTAL KNEE Bilateral    2 on left and 1 on right, Dr Wynelle Link  . ROTATOR CUFF REPAIR Right   . TONSILLECTOMY      Family History  Problem Relation Age of Onset  . Hypertension Mother   . Atrial fibrillation Mother   . Diabetes Mother   . Hyperlipidemia Mother   . Cancer Father        lung  . Diabetes type II Father   . Aneurysm Father        abdominal  . Depression Daughter   . Colon cancer Neg Hx     Social History   Socioeconomic History  . Marital status: Married    Spouse name: Not on file  . Number of children: 5  . Years of education: Not on file  . Highest education level: Not on file  Occupational History  . Occupation: retired  Tobacco Use  . Smoking status: Never Smoker  . Smokeless tobacco: Never Used  Vaping Use  . Vaping Use: Never used  Substance and Sexual Activity  . Alcohol use: Yes    Comment: Occasionally, wine  . Drug use:  No    Comment: Caffeine: Coffee -2-3  cups per day.   Marland Kitchen Sexual activity: Not Currently  Other Topics Concern  . Not on file  Social History Narrative  . Not on file   Social Determinants of Health   Financial Resource Strain:   . Difficulty of Paying Living Expenses: Not on file  Food Insecurity:   . Worried About Charity fundraiser in the Last Year: Not on file  . Ran Out of Food in the Last Year: Not on file  Transportation Needs:   . Lack of Transportation (Medical): Not on file  . Lack of Transportation (Non-Medical): Not on file  Physical Activity:   . Days of Exercise per Week: Not on file  . Minutes of Exercise per Session: Not on file  Stress:   . Feeling of Stress : Not on file  Social Connections:   . Frequency of Communication with Friends and Family: Not on file  . Frequency of Social Gatherings with Friends and Family: Not on file  . Attends Religious Services: Not on file  . Active Member of Clubs or Organizations: Not on file  . Attends Archivist Meetings:  Not on file  . Marital Status: Not on file  Intimate Partner Violence:   . Fear of Current or Ex-Partner: Not on file  . Emotionally Abused: Not on file  . Physically Abused: Not on file  . Sexually Abused: Not on file    Outpatient Medications Prior to Visit  Medication Sig Dispense Refill  . Alcohol Swabs (B-D SINGLE USE SWABS REGULAR) PADS Clean test area with swab before testing. 1 each 3  . alendronate (FOSAMAX) 70 MG tablet Take 1 tablet (70 mg total) by mouth once a week. Take with a full glass of water on an empty stomach. 12 tablet 3  . AMBULATORY NON FORMULARY MEDICATION Dx DM E11.9 - DM shoes - Fax to [phone] 1 each 0  . amLODipine (NORVASC) 10 MG tablet Take 1 tablet (10 mg total) by mouth daily. 90 tablet 2  . ammonium lactate (LAC-HYDRIN) 12 % lotion Apply 1 application topically daily. After showers    . aspirin EC 81 MG tablet Take 81 mg by mouth daily.    Marland Kitchen atorvastatin  (LIPITOR) 20 MG tablet Take 1 tablet (20 mg total) by mouth daily. 90 tablet 2  . baclofen (LIORESAL) 10 MG tablet TAKE 1 TABLET BY MOUTH 2  TIMES DAILY AS DIRECTED 180 tablet 1  . Blood Glucose Calibration (PRODIGY CONTROL SOLUTION) Low SOLN Use as directed 1 each 3  . Blood Glucose Monitoring Suppl (PRODIGY AUTOCODE BLOOD GLUCOSE) w/Device KIT 1 strip by Does not apply route daily. 1 kit 0  . buPROPion (WELLBUTRIN XL) 150 MG 24 hr tablet TAKE 1 TABLET BY MOUTH  TWICE DAILY AT 8AM AND 3PM 180 tablet 1  . clobetasol cream (TEMOVATE) 0.05 % Apply topically.    . DULoxetine (CYMBALTA) 60 MG capsule Take 1 capsule (60 mg total) by mouth daily. 90 capsule 2  . furosemide (LASIX) 20 MG tablet Take 1 tablet (20 mg total) by mouth daily as needed for edema. 20 tablet 3  . glucose blood (PRODIGY NO CODING BLOOD GLUC) test strip Use as instructed 100 each 12  . metFORMIN (GLUCOPHAGE) 1000 MG tablet Take 1 tablet (1,000 mg total) by mouth 2 (two) times daily. 180 tablet 3  . metoprolol succinate (TOPROL-XL) 100 MG 24 hr tablet 1.5 tabs PO QD.  For Blood pressure 270 tablet 1  . mirtazapine (REMERON) 15 MG tablet Take 15 mg by mouth at bedtime.     . Prodigy Twist Top Lancets 28G MISC Use as directed. 1 each 3  . sitaGLIPtin (JANUVIA) 50 MG tablet Take 1 tablet (50 mg total) by mouth daily. 30 tablet 2  . spironolactone (ALDACTONE) 25 MG tablet Take 1 tablet (25 mg total) by mouth daily. 30 tablet 2  . zolpidem (AMBIEN) 5 MG tablet TAKE 1 TABLET BY MOUTH AT BEDTIME AS NEEDED FOR SLEEP 30 tablet 0   No facility-administered medications prior to visit.    Allergies  Allergen Reactions  . Losartan Other (See Comments) and Hives    Cough  Cough   . Gabapentin Other (See Comments)    dizziness  . Lisinopril Other (See Comments)    Dry cough  . Oxycodone Nausea And Vomiting  . Oxycodone-Aspirin Nausea And Vomiting    ROS Review of Systems    Objective:    Physical Exam  BP 138/72   Pulse 65    SpO2 98%  Wt Readings from Last 3 Encounters:  06/16/20 269 lb (122 kg)  05/12/20 271 lb 4.8 oz (123.1  kg)  12/17/19 275 lb (124.7 kg)     Health Maintenance Due  Topic Date Due  . Hepatitis C Screening  Never done  . URINE MICROALBUMIN  07/25/2018  . FOOT EXAM  05/18/2019    There are no preventive care reminders to display for this patient.  Lab Results  Component Value Date   TSH 1.66 12/17/2019   Lab Results  Component Value Date   WBC 8.8 12/17/2019   HGB 13.4 12/17/2019   HCT 42.1 12/17/2019   MCV 91.9 12/17/2019   PLT 279 12/17/2019   Lab Results  Component Value Date   NA 145 12/17/2019   K 4.5 12/17/2019   CO2 26 12/17/2019   GLUCOSE 165 (H) 12/17/2019   BUN 24 12/17/2019   CREATININE 1.36 (H) 12/17/2019   BILITOT 0.3 12/17/2019   ALKPHOS 81 05/08/2017   AST 10 12/17/2019   ALT 9 12/17/2019   PROT 6.6 12/17/2019   ALBUMIN 4.0 05/08/2017   CALCIUM 9.5 12/17/2019   ANIONGAP 6 07/22/2016   GFR 87.26 07/29/2013   Lab Results  Component Value Date   CHOL 167 05/30/2018   Lab Results  Component Value Date   HDL 59 05/30/2018   Lab Results  Component Value Date   LDLCALC 84 05/30/2018   Lab Results  Component Value Date   TRIG 144 05/30/2018   Lab Results  Component Value Date   CHOLHDL 2.8 05/30/2018   Lab Results  Component Value Date   HGBA1C 8.1 (A) 06/16/2020      Assessment & Plan:  Hypertension - Advised patient to continue current medications. Also to follow up in 3 month with Dr Madilyn Fireman.    Problem List Items Addressed This Visit    Essential hypertension - Primary      No orders of the defined types were placed in this encounter.   Follow-up: Return in about 3 months (around 09/29/2020) for hypertension follow up with Dr Madilyn Fireman. Durene Romans, Monico Blitz, [place]

## 2020-06-30 NOTE — Progress Notes (Signed)
Hypertension-BP at goal today.  We will continue to monitor.

## 2020-07-02 ENCOUNTER — Telehealth: Payer: Self-pay | Admitting: Podiatry

## 2020-07-02 NOTE — Telephone Encounter (Signed)
Pt left message @ [phone] wanting a call back to confirm we have her correct phone number. She is waiting on a brace we are ordering for her.  I returned call and left message we do have the correct phone number.

## 2020-07-22 ENCOUNTER — Other Ambulatory Visit: Payer: Self-pay | Admitting: Family Medicine

## 2020-07-23 ENCOUNTER — Other Ambulatory Visit: Payer: Self-pay | Admitting: *Deleted

## 2020-07-26 ENCOUNTER — Other Ambulatory Visit: Payer: Self-pay | Admitting: Family Medicine

## 2020-07-30 ENCOUNTER — Encounter: Payer: Self-pay | Admitting: Gastroenterology

## 2020-07-30 ENCOUNTER — Other Ambulatory Visit: Payer: Medicare PPO

## 2020-07-30 ENCOUNTER — Ambulatory Visit: Payer: Medicare PPO | Admitting: Gastroenterology

## 2020-07-30 VITALS — BP 128/80 | HR 80 | Ht 65.0 in | Wt 261.2 lb

## 2020-07-30 DIAGNOSIS — K9089 Other intestinal malabsorption: Secondary | ICD-10-CM

## 2020-07-30 DIAGNOSIS — R197 Diarrhea, unspecified: Secondary | ICD-10-CM

## 2020-07-30 DIAGNOSIS — K6289 Other specified diseases of anus and rectum: Secondary | ICD-10-CM | POA: Diagnosis not present

## 2020-07-30 DIAGNOSIS — R159 Full incontinence of feces: Secondary | ICD-10-CM

## 2020-07-30 MED ORDER — CHOLESTYRAMINE 4 G PO PACK: 4.0000 g | PACK | Freq: Every day | ORAL | 12 refills | Status: DC

## 2020-07-30 NOTE — Patient Instructions (Addendum)
If you are age 78 or older, your body mass index should be between 23-30. Your Body mass index is 43.47 kg/m. If this is out of the aforementioned range listed, please consider follow up with your Primary Care Provider.  If you are age 15 or younger, your body mass index should be between 19-25. Your Body mass index is 43.47 kg/m. If this is out of the aformentioned range listed, please consider follow up with your Primary Care Provider.   Your provider has requested that you go to the basement level for lab work before leaving today. Press "B" on the elevator. The lab is located at the first door on the left as you exit the elevator.  Take Benifiber 1 tablespoon in liquid three times daily with meals.  We have sent the following medications to your pharmacy for you to pick up at your convenience: Cholestyramine take 1 packet daily, avoid taking it within 3-4 hours of other medications.  We will refer you for pelvic floor therapy. They will contact with and appointment date and time. If you haven't heard from them within 2 weeks please give [name] a call.  Thank you for choose [facility].

## 2020-07-30 NOTE — Progress Notes (Signed)
Carolyn Lambert    [id number]    04-05-1941  Primary Care Physician:Metheney, Rene Kocher, MD  Referring Physician: Hali Marry, MD [place],  [place] [address]   Chief complaint:  Fecal incontinence  HPI: 79 year old very pleasant female here with complaints of worsening fecal incontinence and intermittent diarrhea  She is having formed soft stool with incontinence.  Denies watery stool  Her symptoms are worse in the past 6 months.  Denies any rectal bleeding or blood in stool.  No recent change in diet or medications.  Denies any abdominal pain, vomiting, dysphagia, unintentional weight loss or decreased appetite.  Colonoscopy 02/28/2013:  Diverticulosis otherwise normal exam     Outpatient Encounter Medications as of 07/30/2020  Medication Sig  . Alcohol Swabs (B-D SINGLE USE SWABS REGULAR) PADS Clean test area with swab before testing.  Marland Kitchen alendronate (FOSAMAX) 70 MG tablet TAKE 1 TABLET(70 MG) BY MOUTH 1 TIME A WEEK WITH A FULL GLASS OF WATER AND ON AN EMPTY STOMACH  . AMBULATORY NON FORMULARY MEDICATION Dx DM E11.9 - DM shoes - Fax to [phone]  . amLODipine (NORVASC) 10 MG tablet Take 1 tablet (10 mg total) by mouth daily.  Marland Kitchen ammonium lactate (LAC-HYDRIN) 12 % lotion Apply 1 application topically daily. After showers  . aspirin EC 81 MG tablet Take 81 mg by mouth daily.  Marland Kitchen atorvastatin (LIPITOR) 20 MG tablet Take 1 tablet (20 mg total) by mouth daily.  . baclofen (LIORESAL) 10 MG tablet TAKE 1 TABLET BY MOUTH 2  TIMES DAILY AS DIRECTED  . Blood Glucose Calibration (PRODIGY CONTROL SOLUTION) Low SOLN Use as directed  . Blood Glucose Monitoring Suppl (PRODIGY AUTOCODE BLOOD GLUCOSE) w/Device KIT 1 strip by Does not apply route daily.  Marland Kitchen buPROPion (WELLBUTRIN XL) 150 MG 24 hr tablet TAKE 1 TABLET BY MOUTH  TWICE DAILY AT 8AM AND 3PM  . clobetasol cream (TEMOVATE) 0.05 % Apply topically.  . DULoxetine  (CYMBALTA) 60 MG capsule Take 1 capsule (60 mg total) by mouth daily.  . furosemide (LASIX) 20 MG tablet Take 1 tablet (20 mg total) by mouth daily as needed for edema.  Marland Kitchen glucose blood (PRODIGY NO CODING BLOOD GLUC) test strip Use as instructed  . metFORMIN (GLUCOPHAGE) 1000 MG tablet Take 1 tablet (1,000 mg total) by mouth 2 (two) times daily.  . metoprolol succinate (TOPROL-XL) 100 MG 24 hr tablet 1.5 tabs PO QD.  For Blood pressure  . mirtazapine (REMERON) 15 MG tablet Take 15 mg by mouth at bedtime.   . OXYGEN Inhale 2 L/min into the lungs at bedtime. O2 concentrator  . Prodigy Twist Top Lancets 28G MISC Use as directed.  . RESTASIS 0.05 % ophthalmic emulsion   . sitaGLIPtin (JANUVIA) 50 MG tablet Take 1 tablet (50 mg total) by mouth daily.  Marland Kitchen spironolactone (ALDACTONE) 25 MG tablet Take 1 tablet (25 mg total) by mouth daily.  Marland Kitchen zolpidem (AMBIEN) 5 MG tablet TAKE 1 TABLET BY MOUTH AT BEDTIME AS NEEDED FOR SLEEP   No facility-administered encounter medications on file as of 07/30/2020.    Allergies as of 07/30/2020 - Review Complete 06/30/2020  Allergen Reaction Noted  . Losartan Other (See Comments) and Hives 07/11/2016  . Gabapentin Other (See Comments) 08/14/2014  . Lisinopril Other (See Comments) 09/25/2014  . Oxycodone Nausea And Vomiting 12/29/2014  . Oxycodone-aspirin Nausea And Vomiting 11/03/2011    Past Medical History:  Diagnosis Date  . Anxiety and depression   . Arthritis   . Chronic fatigue syndrome   . Diabetes mellitus   . DVT (deep venous thrombosis) ([place])    s/p knee replacmet.   . Fibromyalgia   . GERD (gastroesophageal reflux disease)   . Hypercholesteremia   . Hypertension   . Major depressive disorder 03/20/2012  . Obesity   . Ovarian cancer ([place])   . Peripheral edema   . Sinusitis July 2013  . Sleep apnea    O2 concentrator at night    Past Surgical History:  Procedure Laterality Date  . ABDOMINAL HYSTERECTOMY    . CATARACT EXTRACTION W/  INTRAOCULAR LENS & ANTERIOR VITRECTOMY Left 11/02/26  . CATARACT EXTRACTION W/ INTRAOCULAR LENS IMPLANT Right 09/28/2016  . LUMBAR DISC SURGERY     x 2  . ORIF ANKLE FRACTURE Right 11/13/2018   [facility]  . REPLACEMENT TOTAL KNEE Bilateral    2 on left and 1 on right, Dr Wynelle Link  . ROTATOR CUFF REPAIR Right   . TONSILLECTOMY    . VEIN LIGATION AND STRIPPING      Family History  Problem Relation Age of Onset  . Hypertension Mother   . Atrial fibrillation Mother   . Diabetes Mother   . Hyperlipidemia Mother   . Diabetes type II Father   . Lung cancer Father   . AAA (abdominal aortic aneurysm) Father   . Depression Daughter   . Diabetes Daughter   . Diabetes Son   . Depression Son   . Depression Son   . Depression Son   . Colon cancer Neg Hx     Social History   Socioeconomic History  . Marital status: Married    Spouse name: Not on file  . Number of children: 5  . Years of education: Not on file  . Highest education level: Not on file  Occupational History  . Occupation: retired  Tobacco Use  . Smoking status: Never Smoker  . Smokeless tobacco: Never Used  Vaping Use  . Vaping Use: Never used  Substance and Sexual Activity  . Alcohol use: Yes    Comment: Occasionally, wine  . Drug use: No    Comment: Caffeine: Coffee -2-3  cups per day.   Marland Kitchen Sexual activity: Not Currently  Other Topics Concern  . Not on file  Social History Narrative  . Not on file   Social Determinants of Health   Financial Resource Strain:   . Difficulty of Paying Living Expenses: Not on file  Food Insecurity:   . Worried About Charity fundraiser in the Last Year: Not on file  . Ran Out of Food in the Last Year: Not on file  Transportation Needs:   . Lack of Transportation (Medical): Not on file  . Lack of Transportation (Non-Medical): Not on file  Physical Activity:   . Days of Exercise per Week: Not on file  . Minutes of Exercise per Session: Not on file  Stress:   . Feeling of  Stress : Not on file  Social Connections:   . Frequency of Communication with Friends and Family: Not on file  . Frequency of Social Gatherings with Friends and Family: Not on file  . Attends Religious Services: Not on file  . Active Member of Clubs or Organizations: Not on file  . Attends Archivist Meetings: Not on file  . Marital Status: Not on file  Intimate Partner Violence:   . Fear of  Current or Ex-Partner: Not on file  . Emotionally Abused: Not on file  . Physically Abused: Not on file  . Sexually Abused: Not on file      Review of systems: All other review of systems negative except as mentioned in the HPI.   Physical Exam: Vitals:   07/30/20 1012  BP: 128/80  Pulse: 80   Body mass index is 43.47 kg/m. Gen:      No acute distress HEENT:  sclera anicteric Abd:      soft, non-tender; no palpable masses, no distension Ext:    No edema Neuro: alert and oriented x 3 Psych: normal mood and affect  Data Reviewed:  Reviewed labs, radiology imaging, old records and pertinent past GI work up   Assessment and Plan/Recommendations:  79 year old very pleasant female with complaints of fecal incontinence and intermittent diarrhea On rectal exam has weak anal sphincter with partial prolapse.  No large hemorrhoids or fissure or nodule   Fecal incontinence: Start Benefiber 1 tablespoon 3 times daily with meals to bulk the stool Refer to pelvic floor physical therapy to strengthen the anal sphincter and pelvic floor  Possible bile salt induced diarrhea: Trial of cholestyramine 1 packet daily, advised patient to avoid taking it within 3 to 4 hours of other medications to prevent drug interaction  If continues to have persistent symptoms, will consider colonoscopy with biopsies for further evaluation  Return in 3 months or sooner if needed   The patient was provided an opportunity to ask questions and all were answered. The patient agreed with the plan and  demonstrated an understanding of the instructions.  Damaris Hippo , MD    CC: Hali Marry, *

## 2020-07-31 ENCOUNTER — Telehealth: Payer: Self-pay | Admitting: Podiatry

## 2020-07-31 NOTE — Telephone Encounter (Signed)
Pt left message checking status of brace and wanted to make sure we had her new number but did not leave it.  I returned call and told pt I was looking into status of brace and would call back as soon as I have more information.

## 2020-08-07 ENCOUNTER — Encounter: Payer: Self-pay | Admitting: Gastroenterology

## 2020-08-09 ENCOUNTER — Emergency Department (INDEPENDENT_AMBULATORY_CARE_PROVIDER_SITE_OTHER)
Admission: EM | Admit: 2020-08-09 | Discharge: 2020-08-09 | Disposition: A | Discharge status: Home / Self Care | Payer: Medicare PPO | Source: Home / Self Care

## 2020-08-09 ENCOUNTER — Encounter: Payer: Self-pay | Admitting: Emergency Medicine

## 2020-08-09 ENCOUNTER — Emergency Department (INDEPENDENT_AMBULATORY_CARE_PROVIDER_SITE_OTHER): Payer: Medicare PPO

## 2020-08-09 DIAGNOSIS — R059 Cough, unspecified: Secondary | ICD-10-CM | POA: Diagnosis not present

## 2020-08-09 DIAGNOSIS — J029 Acute pharyngitis, unspecified: Secondary | ICD-10-CM

## 2020-08-09 DIAGNOSIS — J069 Acute upper respiratory infection, unspecified: Secondary | ICD-10-CM

## 2020-08-09 LAB — POCT RAPID STREP A (OFFICE): Rapid Strep A Screen: NEGATIVE

## 2020-08-09 MED ORDER — GUAIFENESIN ER 600 MG PO TB12: 600.0000 mg | ORAL_TABLET | Freq: Two times a day (BID) | ORAL | 0 refills | Status: DC | PRN

## 2020-08-09 MED ORDER — BENZONATATE 100 MG PO CAPS
100.0000 mg | ORAL_CAPSULE | Freq: Three times a day (TID) | ORAL | 0 refills | Status: DC
Start: 2020-08-09 — End: 2020-08-19

## 2020-08-09 NOTE — ED Provider Notes (Signed)
[facility]    CSN: [id number] Arrival date & time: 08/09/20  1251      History   Chief Complaint Chief Complaint  Patient presents with  . Sore Throat  . Cough    HPI CHANTILLE NAVARRETE is a 79 y.o. female.   HPI  YSIDRA SOPHER is a 79 y.o. female presenting to UC with c/o cough and sore throat that started yesterday.  She had her Pfizer vaccine in March 2021.  Denies chest pain or SOB. No n/v/d. No fever or chills. No known sick contacts.    Past Medical History:  Diagnosis Date  . Anxiety and depression   . Arthritis   . Chronic fatigue syndrome   . Diabetes mellitus   . DVT (deep venous thrombosis) ([facility])    s/p knee replacmet.   . Fibromyalgia   . GERD (gastroesophageal reflux disease)   . Hypercholesteremia   . Hypertension   . Major depressive disorder 03/20/2012  . Obesity   . Ovarian cancer ([facility])   . Peripheral edema   . Sinusitis July 2013  . Sleep apnea    O2 concentrator at night    Patient Active Problem List   Diagnosis Date Noted  . Tremor 12/17/2019  . Right shoulder pain 06/06/2019  . Contusion of right hip 03/01/2019  . Status post ORIF of fracture of ankle 11/14/2018  . Other fracture of right lower leg, initial encounter for closed fracture 11/09/2018  . Closed fracture of right ankle 11/09/2018  . Hyperlipidemia 10/26/2017  . Microalbuminuria due to type 2 diabetes mellitus ([place]) 07/25/2017  . Dry eye syndrome of both lacrimal glands 09/29/2016  . Senile nuclear sclerosis 09/28/2016  . Morbid obesity ([place]) 04/25/2016  . Nocturnal hypoxemia 02/24/2015  . Insomnia 02/16/2015  . Essential hypertension 09/16/2014  . S/P knee replacement 08/25/2014  . Lumbar spondylosis 05/06/2014  . Dry eye syndrome 02/06/2014  . Cataract 02/06/2014  . Macular drusen 02/06/2014  . MDD (major depressive disorder), recurrent episode, severe ([facility]) 01/15/2014  . Post traumatic stress disorder (PTSD) 01/15/2014  . Thyroid nodule  11/08/2013  . Left foot drop 10/14/2013  . DVT (deep venous thrombosis) ([place]) 10/11/2013  . 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 Multinodular goiter 08/02/2013  . Diabetes mellitus, type II ([place]) 04/19/2013  . Pure hypercholesterolemia 04/19/2013  . Major depressive disorder 03/20/2012    Past Surgical History:  Procedure Laterality Date  . ABDOMINAL HYSTERECTOMY    . CATARACT EXTRACTION W/ INTRAOCULAR LENS & ANTERIOR VITRECTOMY Left 11/02/26  . CATARACT EXTRACTION W/ INTRAOCULAR LENS IMPLANT Right 09/28/2016  . LUMBAR DISC SURGERY     x 2  . ORIF ANKLE FRACTURE Right 11/13/2018   [facility]  . REPLACEMENT TOTAL KNEE Bilateral    2 on left and 1 on right, Dr Wynelle Link  . ROTATOR CUFF REPAIR Right   . TONSILLECTOMY    . VEIN LIGATION AND STRIPPING      OB History   No obstetric history on file.      Home Medications    Prior to Admission medications   Medication Sig Start Date End Date Taking? Authorizing Provider  Alcohol Swabs (B-D SINGLE USE SWABS REGULAR) PADS Clean test area with swab before testing. 01/31/20   Hali Marry, MD  alendronate (FOSAMAX) 70 MG tablet TAKE 1 TABLET(70 MG) BY MOUTH 1 TIME A WEEK WITH A FULL GLASS OF WATER AND ON AN EMPTY STOMACH 07/26/20   Hali Marry,  MD  AMBULATORY NON FORMULARY MEDICATION Dx DM E11.9 - DM shoes - Fax to [phone] 01/07/20   Hali Marry, MD  amLODipine (NORVASC) 10 MG tablet Take 1 tablet (10 mg total) by mouth daily. 01/31/20   Hali Marry, MD  ammonium lactate (LAC-HYDRIN) 12 % lotion Apply 1 application topically daily. After showers 04/11/20   [provider]  aspirin EC 81 MG tablet Take 81 mg by mouth daily.    [provider]  atorvastatin (LIPITOR) 20 MG tablet Take 1 tablet (20 mg total) by mouth daily. 01/31/20   Hali Marry, MD  baclofen (LIORESAL) 10 MG tablet TAKE 1 TABLET BY MOUTH 2  TIMES DAILY AS DIRECTED 01/31/20   Hali Marry,  MD  benzonatate (TESSALON) 100 MG capsule Take 1-2 capsules (100-200 mg total) by mouth every 8 (eight) hours. 08/09/20   Noe Gens, PA-C  Blood Glucose Calibration (PRODIGY CONTROL SOLUTION) Low SOLN Use as directed 01/31/20   Hali Marry, MD  Blood Glucose Monitoring Suppl (PRODIGY AUTOCODE BLOOD GLUCOSE) w/Device KIT 1 strip by Does not apply route daily. 01/31/20   Hali Marry, MD  buPROPion (WELLBUTRIN XL) 150 MG 24 hr tablet TAKE 1 TABLET BY MOUTH  TWICE DAILY AT 8AM AND 3PM 07/23/20   Hali Marry, MD  cholestyramine (QUESTRAN) 4 g packet Take 1 packet (4 g total) by mouth daily. Avoid taking with in 3-4 hours of other medications 07/30/20   Mauri Pole, MD  clobetasol cream (TEMOVATE) 0.05 % Apply topically. 06/04/20   [provider]  DULoxetine (CYMBALTA) 60 MG capsule Take 1 capsule (60 mg total) by mouth daily. 01/31/20   Hali Marry, MD  furosemide (LASIX) 20 MG tablet Take 1 tablet (20 mg total) by mouth daily as needed for edema. 06/16/20   Hali Marry, MD  glucose blood (PRODIGY NO CODING BLOOD GLUC) test strip Use as instructed 01/31/20   Hali Marry, MD  guaiFENesin (MUCINEX) 600 MG 12 hr tablet Take 1 tablet (600 mg total) by mouth 2 (two) times daily as needed for to loosen phlegm. 08/09/20   Noe Gens, PA-C  metFORMIN (GLUCOPHAGE) 1000 MG tablet Take 1 tablet (1,000 mg total) by mouth 2 (two) times daily. 02/03/20   Hali Marry, MD  metoprolol succinate (TOPROL-XL) 100 MG 24 hr tablet 1.5 tabs PO QD.  For Blood pressure 02/03/20   Hali Marry, MD  mirtazapine (REMERON) 15 MG tablet Take 15 mg by mouth at bedtime.  01/24/20   [provider]  OXYGEN Inhale 2 L/min into the lungs at bedtime. O2 concentrator    [provider]  Prodigy Twist Top Lancets 28G MISC Use as directed. 01/31/20   Hali Marry, MD  RESTASIS 0.05 % ophthalmic emulsion  06/19/20   Laurence Aly, OD  sitaGLIPtin (JANUVIA) 50 MG tablet Take 1 tablet (50 mg total) by mouth daily. 06/16/20   Hali Marry, MD  spironolactone (ALDACTONE) 25 MG tablet Take 1 tablet (25 mg total) by mouth daily. 06/16/20   Hali Marry, MD  zolpidem (AMBIEN) 5 MG tablet TAKE 1 TABLET BY MOUTH AT BEDTIME AS NEEDED FOR SLEEP 06/16/20   Hali Marry, MD    Family History Family History  Problem Relation Age of Onset  . Hypertension Mother   . Atrial fibrillation Mother   . Diabetes Mother   . Hyperlipidemia Mother   . Diabetes type II Father   .  Lung cancer Father   . AAA (abdominal aortic aneurysm) Father   . Depression Daughter   . Diabetes Daughter   . Diabetes Son   . Depression Son   . Depression Son   . Depression Son   . Colon cancer Neg Hx     Social History Social History   Tobacco Use  . Smoking status: Never Smoker  . Smokeless tobacco: Never Used  Vaping Use  . Vaping Use: Never used  Substance Use Topics  . Alcohol use: Yes    Comment: Occasionally, wine  . Drug use: No    Comment: Caffeine: Coffee -2-3  cups per day.      Allergies   Losartan, Gabapentin, Lisinopril, Oxycodone, and Oxycodone-aspirin   Review of Systems Review of Systems  Constitutional: Negative for chills and fever.  HENT: Positive for congestion and sore throat. Negative for ear pain, trouble swallowing and voice change.   Respiratory: Positive for cough. Negative for shortness of breath.   Cardiovascular: Negative for chest pain and palpitations.  Gastrointestinal: Negative for abdominal pain, diarrhea, nausea and vomiting.  Musculoskeletal: Negative for arthralgias, back pain and myalgias.  Skin: Negative for rash.  All other systems reviewed and are negative.    Physical Exam Triage Vital Signs ED Triage Vitals  Enc Vitals Group     BP 08/09/20 1309 131/80     Pulse Rate 08/09/20 1309 76     Resp 08/09/20 1309 18     Temp 08/09/20 1309 98.5 F (36.9 C)       Temp Source 08/09/20 1309 Oral     SpO2 08/09/20 1309 97 %     Weight 08/09/20 1310 260 lb 2.3 oz (118 kg)     Height 08/09/20 1310 '5\' 5"'  (1.651 m)     Head Circumference --      Peak Flow --      Pain Score 08/09/20 1310 4     Pain Loc --      Pain Edu? --      Excl. in [place]? --    No data found.  Updated Vital Signs BP 131/80 (BP Location: Left Arm)   Pulse 76   Temp 98.5 F (36.9 C) (Oral)   Resp 18   Ht '5\' 5"'  (1.651 m)   Wt 260 lb 2.3 oz (118 kg)   SpO2 97%   BMI 43.29 kg/m   Visual Acuity Right Eye Distance:   Left Eye Distance:   Bilateral Distance:    Right Eye Near:   Left Eye Near:    Bilateral Near:     Physical Exam Vitals and nursing note reviewed.  Constitutional:      General: She is not in acute distress.    Appearance: She is well-developed. She is not ill-appearing, toxic-appearing or diaphoretic.  HENT:     Head: Normocephalic and atraumatic.     Right Ear: Tympanic membrane and ear canal normal.     Left Ear: Tympanic membrane and ear canal normal.     Nose: Nose normal.     Right Sinus: No maxillary sinus tenderness or frontal sinus tenderness.     Left Sinus: No maxillary sinus tenderness or frontal sinus tenderness.     Mouth/Throat:     Lips: Pink.     Mouth: Mucous membranes are moist.     Pharynx: Oropharynx is clear. Uvula midline.  Cardiovascular:     Rate and Rhythm: Normal rate and regular rhythm.  Pulmonary:  Effort: Pulmonary effort is normal. No respiratory distress.     Breath sounds: No stridor. Rhonchi (faint scattered) present. No wheezing or rales.  Musculoskeletal:        General: Normal range of motion.     Cervical back: Normal range of motion and neck supple.  Lymphadenopathy:     Cervical: No cervical adenopathy.  Skin:    General: Skin is warm and dry.  Neurological:     Mental Status: She is alert and oriented to person, place, and time.  Psychiatric:        Behavior: Behavior normal.      UC  Treatments / Results  Labs (all labs ordered are listed, but only abnormal results are displayed) Labs Reviewed  NOVEL CORONAVIRUS, NAA   Narrative:    Performed at:  [address], [place], [place]  [id number] Lab Director: Rush Farmer MD, Phone:  [phone]  SARS-COV-2, NAA 2 DAY TAT   Narrative:    Performed at:  [address], [place], [place]  [id number] Lab Director: Rush Farmer MD, Phone:  [phone]  POCT RAPID STREP A (OFFICE)    EKG   Radiology DG Chest 2 View  Result Date: 08/09/2020 CLINICAL DATA:  Cough, congestion, chest tightness. EXAM: CHEST - 2 VIEW COMPARISON:  Chest radiograph dated 03/01/2019. FINDINGS: The heart size is normal. Vascular calcifications are seen in the aortic arch. Both lungs are clear. Suture anchors are seen in the right humeral head. Eight Degenerative changes are seen in the spine. IMPRESSION: No active cardiopulmonary disease. Aortic Atherosclerosis (ICD10-I70.0). Electronically Signed   By: Zerita Boers M.D.   On: 08/09/2020 14:09    Procedures Procedures (including critical care time)  Medications Ordered in UC Medications - No data to display  Initial Impression / Assessment and Plan / UC Course  I have reviewed the triage vital signs and the nursing notes.  Pertinent labs & imaging results that were available during my care of the patient were reviewed by me and considered in my medical decision making (see chart for details).     Reassured pt of normal CXR Encouraged symptomatic tx F/u with PCP  AVS given  Final Clinical Impressions(s) / UC Diagnoses   Final diagnoses:  Upper respiratory tract infection, unspecified type  Sore throat     Discharge Instructions      Call to schedule a follow up appointment with primary care later this week if not improving.   Call 911 or have someone drive you to the hospital if symptoms significantly worsening including chest  pain, trouble breathing, unable to keep down fluids or other new concerning symptoms develop.      ED Prescriptions    Medication Sig Dispense Auth. Provider   benzonatate (TESSALON) 100 MG capsule Take 1-2 capsules (100-200 mg total) by mouth every 8 (eight) hours. 21 capsule Gerarda Fraction, Leeona Mccardle O, PA-C   guaiFENesin (MUCINEX) 600 MG 12 hr tablet Take 1 tablet (600 mg total) by mouth 2 (two) times daily as needed for to loosen phlegm. 30 tablet Noe Gens, [place]     PDMP not reviewed this encounter.   Noe Gens, [place] 08/10/20 1603

## 2020-08-09 NOTE — ED Triage Notes (Signed)
Cough and sore throat x 24 hours  Pfizer vaccine in March 2021- no booster No OTC meds  Denies fever or chills

## 2020-08-09 NOTE — Discharge Instructions (Signed)
°  Call to schedule a follow up appointment with primary care later this week if not improving.   Call 911 or have someone drive you to the hospital if symptoms significantly worsening including chest pain, trouble breathing, unable to keep down fluids or other new concerning symptoms develop.

## 2020-08-10 LAB — NOVEL CORONAVIRUS, NAA: SARS-CoV-2, NAA: NOT DETECTED

## 2020-08-10 LAB — SARS-COV-2, NAA 2 DAY TAT

## 2020-08-18 ENCOUNTER — Other Ambulatory Visit: Payer: Self-pay | Admitting: Family Medicine

## 2020-08-19 ENCOUNTER — Other Ambulatory Visit: Payer: Self-pay | Admitting: *Deleted

## 2020-08-21 ENCOUNTER — Telehealth: Payer: Self-pay

## 2020-08-21 NOTE — Telephone Encounter (Signed)
We can schedule her for Monday.  If she feels like she is getting worse over the weekend she can always go back to urgent care if needed.

## 2020-08-21 NOTE — Telephone Encounter (Signed)
Carolyn Lambert states she has been sick for a couple of weeks. She was seen in urgent care and treated with Mucinex. Denies fever chills or sweats. She reports a productive cough with green sputum. No shortness of breath. No openings today.

## 2020-08-21 NOTE — Telephone Encounter (Signed)
I did schedule her to come in on Monday. She states she will come in because she is bringing Carolyn Lambert to his appointment on Monday. Please advise.

## 2020-08-24 ENCOUNTER — Telehealth: Payer: Self-pay | Admitting: Podiatry

## 2020-08-24 ENCOUNTER — Telehealth: Payer: Self-pay | Admitting: Family Medicine

## 2020-08-24 ENCOUNTER — Ambulatory Visit (INDEPENDENT_AMBULATORY_CARE_PROVIDER_SITE_OTHER): Payer: Medicare PPO | Admitting: Family Medicine

## 2020-08-24 ENCOUNTER — Encounter: Payer: Self-pay | Admitting: Family Medicine

## 2020-08-24 VITALS — BP 110/52 | HR 67 | Ht 65.0 in | Wt 262.0 lb

## 2020-08-24 DIAGNOSIS — R059 Cough, unspecified: Secondary | ICD-10-CM

## 2020-08-24 DIAGNOSIS — I959 Hypotension, unspecified: Secondary | ICD-10-CM | POA: Diagnosis not present

## 2020-08-24 NOTE — Telephone Encounter (Signed)
Call patient and let her know to hold her metoprolol since her blood pressure was low and then come in 1 to 2 weeks to recheck her pressure if it is coming up especially she is feeling better then we can always restart the metoprolol but I do not want start it while her pressure is low.

## 2020-08-24 NOTE — Progress Notes (Signed)
Established Patient Office Visit  Subjective:  Patient ID: Carolyn Lambert, female    DOB: 04-04-1941  Age: 79 y.o. MRN: [id number]  CC:  Chief Complaint  Patient presents with  . Cough    HPI Carolyn Lambert presents for productive cough.  She is actually been sick for about 3 weeks but feels like she is at least 50% better.  She had a very productive cough which has gotten better but she still seeing little bit of blood when she coughs.  She is also felt a little bit short of breath and fatigued.  Though she has not been wearing her oxygen.  No GI upset.  She did have sinus congestion initially but that seemed to have resolved she was taking Mucinex for a while but again has stopped that since she is been feeling little bit better.  No GI symptoms.  No sore throat or ear pain.  Past Medical History:  Diagnosis Date  . Anxiety and depression   . Arthritis   . Chronic fatigue syndrome   . Diabetes mellitus   . DVT (deep venous thrombosis) ([place])    s/p knee replacmet.   . Fibromyalgia   . GERD (gastroesophageal reflux disease)   . Hypercholesteremia   . Hypertension   . Major depressive disorder 03/20/2012  . Obesity   . Ovarian cancer ([place])   . Peripheral edema   . Sinusitis July 2013  . Sleep apnea    O2 concentrator at night    Past Surgical History:  Procedure Laterality Date  . ABDOMINAL HYSTERECTOMY    . CATARACT EXTRACTION W/ INTRAOCULAR LENS & ANTERIOR VITRECTOMY Left 11/02/26  . CATARACT EXTRACTION W/ INTRAOCULAR LENS IMPLANT Right 09/28/2016  . LUMBAR DISC SURGERY     x 2  . ORIF ANKLE FRACTURE Right 11/13/2018   [facility]  . REPLACEMENT TOTAL KNEE Bilateral    2 on left and 1 on right, Dr Wynelle Link  . ROTATOR CUFF REPAIR Right   . TONSILLECTOMY    . VEIN LIGATION AND STRIPPING      Family History  Problem Relation Age of Onset  . Hypertension Mother   . Atrial fibrillation Mother   . Diabetes Mother   . Hyperlipidemia Mother   . Diabetes type  II Father   . Lung cancer Father   . AAA (abdominal aortic aneurysm) Father   . Depression Daughter   . Diabetes Daughter   . Diabetes Son   . Depression Son   . Depression Son   . Depression Son   . Colon cancer Neg Hx     Social History   Socioeconomic History  . Marital status: Married    Spouse name: Not on file  . Number of children: 5  . Years of education: Not on file  . Highest education level: Not on file  Occupational History  . Occupation: retired  Tobacco Use  . Smoking status: Never Smoker  . Smokeless tobacco: Never Used  Vaping Use  . Vaping Use: Never used  Substance and Sexual Activity  . Alcohol use: Yes    Comment: Occasionally, wine  . Drug use: No    Comment: Caffeine: Coffee -2-3  cups per day.   Marland Kitchen Sexual activity: Not Currently  Other Topics Concern  . Not on file  Social History Narrative  . Not on file   Social Determinants of Health   Financial Resource Strain:   . Difficulty of Paying Living Expenses: Not on file  Food Insecurity:   . Worried About Charity fundraiser in the Last Year: Not on file  . Ran Out of Food in the Last Year: Not on file  Transportation Needs:   . Lack of Transportation (Medical): Not on file  . Lack of Transportation (Non-Medical): Not on file  Physical Activity:   . Days of Exercise per Week: Not on file  . Minutes of Exercise per Session: Not on file  Stress:   . Feeling of Stress : Not on file  Social Connections:   . Frequency of Communication with Friends and Family: Not on file  . Frequency of Social Gatherings with Friends and Family: Not on file  . Attends Religious Services: Not on file  . Active Member of Clubs or Organizations: Not on file  . Attends Archivist Meetings: Not on file  . Marital Status: Not on file  Intimate Partner Violence:   . Fear of Current or Ex-Partner: Not on file  . Emotionally Abused: Not on file  . Physically Abused: Not on file  . Sexually Abused: Not on  file    Outpatient Medications Prior to Visit  Medication Sig Dispense Refill  . Alcohol Swabs (B-D SINGLE USE SWABS REGULAR) PADS Clean test area with swab before testing. 1 each 3  . alendronate (FOSAMAX) 70 MG tablet TAKE 1 TABLET(70 MG) BY MOUTH 1 TIME A WEEK WITH A FULL GLASS OF WATER AND ON AN EMPTY STOMACH 12 tablet 3  . AMBULATORY NON FORMULARY MEDICATION Dx DM E11.9 - DM shoes - Fax to [phone] 1 each 0  . amLODipine (NORVASC) 10 MG tablet Take 1 tablet (10 mg total) by mouth daily. 90 tablet 2  . ammonium lactate (LAC-HYDRIN) 12 % lotion Apply 1 application topically daily. After showers    . aspirin EC 81 MG tablet Take 81 mg by mouth daily.    Marland Kitchen atorvastatin (LIPITOR) 20 MG tablet Take 1 tablet (20 mg total) by mouth daily. 90 tablet 2  . baclofen (LIORESAL) 10 MG tablet TAKE 1 TABLET BY MOUTH 2  TIMES DAILY AS DIRECTED 180 tablet 1  . Blood Glucose Calibration (PRODIGY CONTROL SOLUTION) Low SOLN Use as directed 1 each 3  . Blood Glucose Monitoring Suppl (PRODIGY AUTOCODE BLOOD GLUCOSE) w/Device KIT 1 strip by Does not apply route daily. 1 kit 0  . buPROPion (WELLBUTRIN XL) 150 MG 24 hr tablet TAKE 1 TABLET BY MOUTH  TWICE DAILY AT 8AM AND 3PM 180 tablet 1  . cholestyramine (QUESTRAN) 4 g packet Take 1 packet (4 g total) by mouth daily. Avoid taking with in 3-4 hours of other medications 60 each 12  . clobetasol cream (TEMOVATE) 0.05 % Apply topically.    . DULoxetine (CYMBALTA) 60 MG capsule Take 1 capsule (60 mg total) by mouth daily. 90 capsule 2  . furosemide (LASIX) 20 MG tablet Take 1 tablet (20 mg total) by mouth daily as needed for edema. 20 tablet 3  . glucose blood (PRODIGY NO CODING BLOOD GLUC) test strip Use as instructed 100 each 12  . metFORMIN (GLUCOPHAGE) 1000 MG tablet Take 1 tablet (1,000 mg total) by mouth 2 (two) times daily. 180 tablet 3  . metoprolol succinate (TOPROL-XL) 100 MG 24 hr tablet 1.5 tabs PO QD.  For Blood pressure 270 tablet 1  . OXYGEN Inhale  2 L/min into the lungs at bedtime. O2 concentrator    . Prodigy Twist Top Lancets 28G MISC Use as directed. 1 each 3  .  RESTASIS 0.05 % ophthalmic emulsion     . sitaGLIPtin (JANUVIA) 50 MG tablet Take 1 tablet (50 mg total) by mouth daily. 30 tablet 2  . spironolactone (ALDACTONE) 25 MG tablet Take 1 tablet (25 mg total) by mouth daily. 30 tablet 2  . zolpidem (AMBIEN) 5 MG tablet TAKE 1 TABLET BY MOUTH AT BEDTIME AS NEEDED FOR SLEEP 30 tablet 0  . mirtazapine (REMERON) 15 MG tablet Take 15 mg by mouth at bedtime.      No facility-administered medications prior to visit.    Allergies  Allergen Reactions  . Losartan Other (See Comments) and Hives    Cough  Cough   . Gabapentin Other (See Comments)    dizziness  . Lisinopril Other (See Comments)    Dry cough  . Oxycodone Nausea And Vomiting  . Oxycodone-Aspirin Nausea And Vomiting    ROS Review of Systems    Objective:    Physical Exam Constitutional:      Appearance: She is well-developed.  HENT:     Head: Normocephalic and atraumatic.     Right Ear: Tympanic membrane, ear canal and external ear normal.     Left Ear: Tympanic membrane, ear canal and external ear normal.     Nose: Nose normal.     Mouth/Throat:     Mouth: Mucous membranes are moist.     Pharynx: Oropharynx is clear. No oropharyngeal exudate or posterior oropharyngeal erythema.  Eyes:     Conjunctiva/sclera: Conjunctivae normal.     Pupils: Pupils are equal, round, and reactive to light.  Neck:     Thyroid: No thyromegaly.  Cardiovascular:     Rate and Rhythm: Normal rate and regular rhythm.     Heart sounds: Normal heart sounds.  Pulmonary:     Effort: Pulmonary effort is normal.     Breath sounds: Normal breath sounds. No wheezing.  Musculoskeletal:     Cervical back: Neck supple.  Lymphadenopathy:     Cervical: No cervical adenopathy.  Skin:    General: Skin is warm and dry.  Neurological:     Mental Status: She is alert and oriented to  person, place, and time.     BP (!) 111/50   Pulse 62   Ht _0  (1.651 m)   Wt 262 lb (118.8 kg)   SpO2 94%   BMI 43.60 kg/m  Wt Readings from Last 3 Encounters:  08/24/20 262 lb (118.8 kg)  08/09/20 260 lb 2.3 oz (118 kg)  07/30/20 261 lb 4 oz (118.5 kg)     Health Maintenance Due  Topic Date Due  . Hepatitis C Screening  Never done  . URINE MICROALBUMIN  07/25/2018  . FOOT EXAM  05/18/2019    There are no preventive care reminders to display for this patient.  Lab Results  Component Value Date   TSH 1.66 12/17/2019   Lab Results  Component Value Date   WBC 8.8 12/17/2019   HGB 13.4 12/17/2019   HCT 42.1 12/17/2019   MCV 91.9 12/17/2019   PLT 279 12/17/2019   Lab Results  Component Value Date   NA 145 12/17/2019   K 4.5 12/17/2019   CO2 26 12/17/2019   GLUCOSE 165 (H) 12/17/2019   BUN 24 12/17/2019   CREATININE 1.36 (H) 12/17/2019   BILITOT 0.3 12/17/2019   ALKPHOS 81 05/08/2017   AST 10 12/17/2019   ALT 9 12/17/2019   PROT 6.6 12/17/2019   ALBUMIN 4.0 05/08/2017   CALCIUM 9.5  12/17/2019   ANIONGAP 6 07/22/2016   GFR 87.26 07/29/2013   Lab Results  Component Value Date   CHOL 154 06/30/2020   Lab Results  Component Value Date   HDL 51 06/30/2020   Lab Results  Component Value Date   LDLCALC 75 06/30/2020   Lab Results  Component Value Date   TRIG 186 (H) 06/30/2020   Lab Results  Component Value Date   CHOLHDL 3.0 06/30/2020   Lab Results  Component Value Date   HGBA1C 8.1 (A) 06/16/2020      Assessment & Plan:   Problem List Items Addressed This Visit    None    Visit Diagnoses    Cough    -  Primary   Hypotension, unspecified hypotension type         She is at least 50% improved so I think at this point with a normal chest exam and feeling better that we will just continue to monitor I suspect it may take about another week for her to bounce completely back.  Hypotension-she is actually been off her metoprolol for  "couple of days as she ran out.  She has not picked it up at the pharmacy yet they have it ready for her.  Encouraged her to just continue to hold the medicine for now and have her come back for blood pressure check in 1 to 2 weeks at home if her pressure is low because she has not been eating and drinking normally since she is been sick but I had rather monitor before we restart the medication.  No orders of the defined types were placed in this encounter.   Follow-up: Return in about 4 weeks (around 09/21/2020) for Diabetes follow-up.    Beatrice Lecher, MD

## 2020-08-24 NOTE — Telephone Encounter (Signed)
Pt called back and I apologized and have scheduled her to see Liliane Channel on 11.18 to be rescanned for brace.Marland KitchenMarland Kitchen

## 2020-08-24 NOTE — Telephone Encounter (Signed)
Pt called and left message asking for a call back about a brace that was to be ordered a while ago and she has not heard anything.  I returned call and left message that the mold was lost in shipping and we need her to come in to be remolded or scanned to call to schedule an appt. I apologized for the inconvenience.

## 2020-08-25 NOTE — Telephone Encounter (Signed)
Patient advised and scheduled.  

## 2020-08-25 NOTE — Telephone Encounter (Signed)
Left message advising of recommendations.  

## 2020-08-26 ENCOUNTER — Telehealth: Payer: Self-pay | Admitting: Podiatry

## 2020-08-26 NOTE — Telephone Encounter (Signed)
Pt left message asking if she could get her nails trimmed tomorrow while she is here to be re measured for the brace.   I returned call and explained that Dr Jacqualyn Posey is out of the office tomorrow and all the doctors are full as of now but to maybe ask to see if we had a cancelation tomorrow when she comes in.

## 2020-08-27 ENCOUNTER — Other Ambulatory Visit: Payer: Medicare PPO | Admitting: Orthotics

## 2020-09-05 ENCOUNTER — Other Ambulatory Visit: Payer: Self-pay | Admitting: Family Medicine

## 2020-09-08 ENCOUNTER — Other Ambulatory Visit: Payer: Self-pay

## 2020-09-08 ENCOUNTER — Ambulatory Visit (INDEPENDENT_AMBULATORY_CARE_PROVIDER_SITE_OTHER): Payer: Medicare PPO | Admitting: Family Medicine

## 2020-09-08 ENCOUNTER — Encounter: Payer: Self-pay | Admitting: Family Medicine

## 2020-09-08 VITALS — BP 132/60 | HR 102

## 2020-09-08 DIAGNOSIS — I499 Cardiac arrhythmia, unspecified: Secondary | ICD-10-CM | POA: Diagnosis not present

## 2020-09-08 DIAGNOSIS — R42 Dizziness and giddiness: Secondary | ICD-10-CM

## 2020-09-08 DIAGNOSIS — I10 Essential (primary) hypertension: Secondary | ICD-10-CM

## 2020-09-08 MED ORDER — METOPROLOL SUCCINATE ER 25 MG PO TB24: 25.0000 mg | ORAL_TABLET | Freq: Every day | ORAL | 1 refills | Status: DC

## 2020-09-08 NOTE — Progress Notes (Signed)
Established Patient Office Visit  Subjective:  Patient ID: Carolyn Lambert, female    DOB: 03/24/1941  Age: 79 y.o. MRN: [id number]  CC:  Chief Complaint  Patient presents with  . Hypertension    HPI Carolyn Lambert presents for f/U BP.  When I saw her in the office her blood pressure was actually quite low so encouraged her to hold her metoprolol and then come back in 1 to 2 weeks for blood pressure check.  She felt like she has been doing well off the medication hasn't really noticed a big difference.  But she did say that last week she got up to put lotion on her husband's back suddenly felt lightheaded and fell backwards she said she never lost consciousness but she did have to call EMS to help her get up off the floor.  Past Medical History:  Diagnosis Date  . Anxiety and depression   . Arthritis   . Chronic fatigue syndrome   . Diabetes mellitus   . DVT (deep venous thrombosis) ([place])    s/p knee replacmet.   . Fibromyalgia   . GERD (gastroesophageal reflux disease)   . Hypercholesteremia   . Hypertension   . Major depressive disorder 03/20/2012  . Obesity   . Ovarian cancer ([place])   . Peripheral edema   . Sinusitis July 2013  . Sleep apnea    O2 concentrator at night    Past Surgical History:  Procedure Laterality Date  . ABDOMINAL HYSTERECTOMY    . CATARACT EXTRACTION W/ INTRAOCULAR LENS & ANTERIOR VITRECTOMY Left 11/02/26  . CATARACT EXTRACTION W/ INTRAOCULAR LENS IMPLANT Right 09/28/2016  . LUMBAR DISC SURGERY     x 2  . ORIF ANKLE FRACTURE Right 11/13/2018   [facility]  . REPLACEMENT TOTAL KNEE Bilateral    2 on left and 1 on right, Dr Wynelle Link  . ROTATOR CUFF REPAIR Right   . TONSILLECTOMY    . VEIN LIGATION AND STRIPPING      Family History  Problem Relation Age of Onset  . Hypertension Mother   . Atrial fibrillation Mother   . Diabetes Mother   . Hyperlipidemia Mother   . Diabetes type II Father   . Lung cancer Father   . AAA (abdominal  aortic aneurysm) Father   . Depression Daughter   . Diabetes Daughter   . Diabetes Son   . Depression Son   . Depression Son   . Depression Son   . Colon cancer Neg Hx     Social History   Socioeconomic History  . Marital status: Married    Spouse name: Not on file  . Number of children: 5  . Years of education: Not on file  . Highest education level: Not on file  Occupational History  . Occupation: retired  Tobacco Use  . Smoking status: Never Smoker  . Smokeless tobacco: Never Used  Vaping Use  . Vaping Use: Never used  Substance and Sexual Activity  . Alcohol use: Yes    Comment: Occasionally, wine  . Drug use: No    Comment: Caffeine: Coffee -2-3  cups per day.   Marland Kitchen Sexual activity: Not Currently  Other Topics Concern  . Not on file  Social History Narrative  . Not on file   Social Determinants of Health   Financial Resource Strain:   . Difficulty of Paying Living Expenses: Not on file  Food Insecurity:   . Worried About Charity fundraiser in the  Last Year: Not on file  . Ran Out of Food in the Last Year: Not on file  Transportation Needs:   . Lack of Transportation (Medical): Not on file  . Lack of Transportation (Non-Medical): Not on file  Physical Activity:   . Days of Exercise per Week: Not on file  . Minutes of Exercise per Session: Not on file  Stress:   . Feeling of Stress : Not on file  Social Connections:   . Frequency of Communication with Friends and Family: Not on file  . Frequency of Social Gatherings with Friends and Family: Not on file  . Attends Religious Services: Not on file  . Active Member of Clubs or Organizations: Not on file  . Attends Archivist Meetings: Not on file  . Marital Status: Not on file  Intimate Partner Violence:   . Fear of Current or Ex-Partner: Not on file  . Emotionally Abused: Not on file  . Physically Abused: Not on file  . Sexually Abused: Not on file    Outpatient Medications Prior to Visit   Medication Sig Dispense Refill  . Alcohol Swabs (B-D SINGLE USE SWABS REGULAR) PADS Clean test area with swab before testing. 1 each 3  . alendronate (FOSAMAX) 70 MG tablet TAKE 1 TABLET(70 MG) BY MOUTH 1 TIME A WEEK WITH A FULL GLASS OF WATER AND ON AN EMPTY STOMACH 12 tablet 3  . AMBULATORY NON FORMULARY MEDICATION Dx DM E11.9 - DM shoes - Fax to [phone] 1 each 0  . amLODipine (NORVASC) 10 MG tablet Take 1 tablet (10 mg total) by mouth daily. 90 tablet 2  . ammonium lactate (LAC-HYDRIN) 12 % lotion Apply 1 application topically daily. After showers    . aspirin EC 81 MG tablet Take 81 mg by mouth daily.    Marland Kitchen atorvastatin (LIPITOR) 20 MG tablet Take 1 tablet (20 mg total) by mouth daily. 90 tablet 2  . baclofen (LIORESAL) 10 MG tablet TAKE 1 TABLET BY MOUTH 2  TIMES DAILY AS DIRECTED 180 tablet 1  . Blood Glucose Calibration (PRODIGY CONTROL SOLUTION) Low SOLN Use as directed 1 each 3  . Blood Glucose Monitoring Suppl (PRODIGY AUTOCODE BLOOD GLUCOSE) w/Device KIT 1 strip by Does not apply route daily. 1 kit 0  . buPROPion (WELLBUTRIN XL) 150 MG 24 hr tablet TAKE 1 TABLET BY MOUTH  TWICE DAILY AT 8AM AND 3PM 180 tablet 1  . cholestyramine (QUESTRAN) 4 g packet Take 1 packet (4 g total) by mouth daily. Avoid taking with in 3-4 hours of other medications 60 each 12  . clobetasol cream (TEMOVATE) 0.05 % Apply topically.    . DULoxetine (CYMBALTA) 60 MG capsule Take 1 capsule (60 mg total) by mouth daily. 90 capsule 2  . furosemide (LASIX) 20 MG tablet Take 1 tablet (20 mg total) by mouth daily as needed for edema. 20 tablet 3  . glucose blood (PRODIGY NO CODING BLOOD GLUC) test strip Use as instructed 100 each 12  . metFORMIN (GLUCOPHAGE) 1000 MG tablet Take 1 tablet (1,000 mg total) by mouth 2 (two) times daily. 180 tablet 3  . OXYGEN Inhale 2 L/min into the lungs at bedtime. O2 concentrator    . Prodigy Twist Top Lancets 28G MISC Use as directed. 1 each 3  . RESTASIS 0.05 % ophthalmic  emulsion     . sitaGLIPtin (JANUVIA) 50 MG tablet Take 1 tablet (50 mg total) by mouth daily. 30 tablet 2  . spironolactone (ALDACTONE) 25  MG tablet Take 1 tablet (25 mg total) by mouth daily. 30 tablet 2  . zolpidem (AMBIEN) 5 MG tablet TAKE 1 TABLET BY MOUTH AT BEDTIME AS NEEDED FOR SLEEP 30 tablet 0  . metoprolol succinate (TOPROL-XL) 100 MG 24 hr tablet 1.5 tabs PO QD.  For Blood pressure 270 tablet 1   No facility-administered medications prior to visit.    Allergies  Allergen Reactions  . Losartan Other (See Comments) and Hives    Cough  Cough   . Gabapentin Other (See Comments)    dizziness  . Lisinopril Other (See Comments)    Dry cough  . Oxycodone Nausea And Vomiting  . Oxycodone-Aspirin Nausea And Vomiting    ROS Review of Systems    Objective:    Physical Exam Constitutional:      Appearance: She is well-developed.  HENT:     Head: Normocephalic and atraumatic.  Cardiovascular:     Rate and Rhythm: Normal rate. Rhythm irregular.     Heart sounds: Normal heart sounds. No murmur heard.   Pulmonary:     Effort: Pulmonary effort is normal.     Breath sounds: Normal breath sounds.  Skin:    General: Skin is warm and dry.  Neurological:     Mental Status: She is alert and oriented to person, place, and time.  Psychiatric:        Behavior: Behavior normal.     BP 132/60   Pulse (!) 102   SpO2 99%  Wt Readings from Last 3 Encounters:  08/24/20 262 lb (118.8 kg)  08/09/20 260 lb 2.3 oz (118 kg)  07/30/20 261 lb 4 oz (118.5 kg)     Health Maintenance Due  Topic Date Due  . Hepatitis C Screening  Never done  . URINE MICROALBUMIN  07/25/2018  . FOOT EXAM  05/18/2019    There are no preventive care reminders to display for this patient.  Lab Results  Component Value Date   TSH 1.66 12/17/2019   Lab Results  Component Value Date   WBC 8.8 12/17/2019   HGB 13.4 12/17/2019   HCT 42.1 12/17/2019   MCV 91.9 12/17/2019   PLT 279 12/17/2019    Lab Results  Component Value Date   NA 145 12/17/2019   K 4.5 12/17/2019   CO2 26 12/17/2019   GLUCOSE 165 (H) 12/17/2019   BUN 24 12/17/2019   CREATININE 1.36 (H) 12/17/2019   BILITOT 0.3 12/17/2019   ALKPHOS 81 05/08/2017   AST 10 12/17/2019   ALT 9 12/17/2019   PROT 6.6 12/17/2019   ALBUMIN 4.0 05/08/2017   CALCIUM 9.5 12/17/2019   ANIONGAP 6 07/22/2016   GFR 87.26 07/29/2013   Lab Results  Component Value Date   CHOL 154 06/30/2020   Lab Results  Component Value Date   HDL 51 06/30/2020   Lab Results  Component Value Date   LDLCALC 75 06/30/2020   Lab Results  Component Value Date   TRIG 186 (H) 06/30/2020   Lab Results  Component Value Date   CHOLHDL 3.0 06/30/2020   Lab Results  Component Value Date   HGBA1C 8.1 (A) 06/16/2020      Assessment & Plan:   Problem List Items Addressed This Visit      Cardiovascular and Mediastinum   Essential hypertension    Pressure actually looks much better today.  It was definitely too low the other day so going to hold her metoprolol for now.  We'll need to  monitor her heart rate as she is borderline tachycardic.      Relevant Medications   metoprolol succinate (TOPROL-XL) 25 MG 24 hr tablet    Other Visit Diagnoses    Irregular heart rhythm    -  Primary   Lightheadedness          On exam heart rhythm sounded somewhat irregular.  EKG today showed rate of 100 bpm, normal sinus rhythm with no acute ST-T wave changes.  Occasional PVC.  Lightheadedness-unclear etiology.  Sounds like it was likely vasovagal, since it happened right after standing up did encourage her to check her blood pressure if it happens again if she feels lightheaded just to see also checking her pulse.  I am going to restart a really low-dose of beta-blocker just to bring that pulse down if it is 100 beats a minute at rest then I am worried that with activity it could go higher and make her feel more tired.  So we will put her back on just  25 mg.  And monitor carefully she will actually be following up in about 4 weeks.  Meds ordered this encounter  Medications  . metoprolol succinate (TOPROL-XL) 25 MG 24 hr tablet    Sig: Take 1 tablet (25 mg total) by mouth at bedtime. For Blood pressure    Dispense:  90 tablet    Refill:  1    Follow-up: No follow-ups on file.   I spent 32 minutes on the day of the encounter to include pre-visit record review, face-to-face time with the patient and post visit ordering of test.   Beatrice Lecher, MD

## 2020-09-08 NOTE — Assessment & Plan Note (Signed)
Pressure actually looks much better today.  It was definitely too low the other day so going to hold her metoprolol for now.  We'll need to monitor her heart rate as she is borderline tachycardic.

## 2020-09-08 NOTE — Patient Instructions (Signed)
Please discard the metoprolol 100 mg tabs.

## 2020-09-10 ENCOUNTER — Other Ambulatory Visit: Payer: Self-pay

## 2020-09-10 DIAGNOSIS — I10 Essential (primary) hypertension: Secondary | ICD-10-CM

## 2020-09-10 MED ORDER — SPIRONOLACTONE 25 MG PO TABS: 25.0000 mg | ORAL_TABLET | Freq: Every day | ORAL | 2 refills | Status: DC

## 2020-09-10 NOTE — Addendum Note (Signed)
Addended by: Narda Rutherford on: 09/10/2020 07:57 AM   Modules accepted: Orders

## 2020-09-11 ENCOUNTER — Other Ambulatory Visit: Payer: Self-pay | Admitting: Family Medicine

## 2020-09-11 DIAGNOSIS — E119 Type 2 diabetes mellitus without complications: Secondary | ICD-10-CM

## 2020-09-25 ENCOUNTER — Other Ambulatory Visit: Payer: Self-pay

## 2020-09-25 DIAGNOSIS — F5101 Primary insomnia: Secondary | ICD-10-CM

## 2020-09-25 MED ORDER — ZOLPIDEM TARTRATE 5 MG PO TABS: ORAL_TABLET | ORAL | 0 refills | Status: DC

## 2020-09-25 NOTE — Telephone Encounter (Signed)
[facility] is requesting a refill of Ambien for patient.

## 2020-09-28 ENCOUNTER — Ambulatory Visit: Payer: Medicare PPO | Admitting: Family Medicine

## 2020-10-27 ENCOUNTER — Telehealth: Payer: Self-pay

## 2020-10-27 NOTE — Telephone Encounter (Signed)
Ayelen called and states Carolyn Lambert's funeral is on Saturday at the [employer] in [place] at 11 am.

## 2020-10-30 ENCOUNTER — Other Ambulatory Visit: Payer: Self-pay | Admitting: Family Medicine

## 2020-10-30 DIAGNOSIS — I10 Essential (primary) hypertension: Secondary | ICD-10-CM

## 2020-10-30 NOTE — Telephone Encounter (Signed)
Called and left her message this morning giving our condolences.  We will also send a card and plan for the funeral.

## 2020-11-02 ENCOUNTER — Telehealth: Payer: Self-pay | Admitting: Podiatry

## 2020-11-02 NOTE — Telephone Encounter (Signed)
Pt left message that she needed to r/s her appt to be fitted for the brace that was lost in the mail for about a month from now. She said her husband passed away on 11-02-20 and has a lot going on.    I returned call and left message for pt to call to schedule an appt.

## 2020-11-05 ENCOUNTER — Other Ambulatory Visit: Payer: Self-pay | Admitting: Family Medicine

## 2020-11-05 DIAGNOSIS — I10 Essential (primary) hypertension: Secondary | ICD-10-CM

## 2020-11-05 MED ORDER — ATORVASTATIN CALCIUM 20 MG PO TABS
20.0000 mg | ORAL_TABLET | Freq: Every day | ORAL | 3 refills | Status: DC
Start: 2020-11-05 — End: 2021-11-10

## 2020-11-12 ENCOUNTER — Encounter: Payer: Self-pay | Admitting: Family Medicine

## 2020-11-12 ENCOUNTER — Other Ambulatory Visit: Payer: Self-pay

## 2020-11-12 ENCOUNTER — Ambulatory Visit (INDEPENDENT_AMBULATORY_CARE_PROVIDER_SITE_OTHER): Payer: Medicare PPO | Admitting: Family Medicine

## 2020-11-12 VITALS — BP 122/55 | HR 75 | Ht 65.0 in | Wt 259.0 lb

## 2020-11-12 DIAGNOSIS — F4321 Adjustment disorder with depressed mood: Secondary | ICD-10-CM | POA: Diagnosis not present

## 2020-11-12 DIAGNOSIS — E119 Type 2 diabetes mellitus without complications: Secondary | ICD-10-CM | POA: Diagnosis not present

## 2020-11-12 DIAGNOSIS — I10 Essential (primary) hypertension: Secondary | ICD-10-CM | POA: Diagnosis not present

## 2020-11-12 DIAGNOSIS — F5101 Primary insomnia: Secondary | ICD-10-CM

## 2020-11-12 LAB — POCT GLYCOSYLATED HEMOGLOBIN (HGB A1C): Hemoglobin A1C: 6.4 % — AB (ref 4.0–5.6)

## 2020-11-12 MED ORDER — ZOLPIDEM TARTRATE 5 MG PO TABS: ORAL_TABLET | ORAL | 0 refills | Status: DC

## 2020-11-12 NOTE — Assessment & Plan Note (Signed)
Currently on Ambien.  She has been taking an extra half a tab because she is had difficulty getting asleep since her husband passed away a couple of weeks ago.  We discussed short-term increase in her medication but safety data shows it really is best at 5 mg particularly for women over the age of 76.  But again we will increase dose just for the short-term.

## 2020-11-12 NOTE — Progress Notes (Signed)
Established Patient Office Visit  Subjective:  Patient ID: Carolyn Lambert, female    DOB: [birth year]  Age: 80 y.o. MRN: [id number]  CC:  Chief Complaint  Patient presents with  . Follow-up    HPI Carolyn Lambert presents for follow-up blood pressure.  When I last saw her she was actually experiencing some low blood pressures.  Diabetes - no hypoglycemic events. No wounds or sores that are not healing well. No increased thirst or urination. Checking glucose at home. Taking medications as prescribed without any side effects.  Ports that she is not eating the best right now since her husband passed.  Carolyn Lambert does have some support through friends and family she is not currently seeing a therapist or counselor.  Right now she says she is just trying to deal with all the paperwork.   Past Medical History:  Diagnosis Date  . Anxiety and depression   . Arthritis   . Chronic fatigue syndrome   . Diabetes mellitus   . DVT (deep venous thrombosis) ([place])    s/p knee replacmet.   . Fibromyalgia   . GERD (gastroesophageal reflux disease)   . Hypercholesteremia   . Hypertension   . Major depressive disorder 03/20/2012  . Obesity   . Ovarian cancer ([place])   . Peripheral edema   . Sinusitis July 2013  . Sleep apnea    O2 concentrator at night    Past Surgical History:  Procedure Laterality Date  . ABDOMINAL HYSTERECTOMY    . CATARACT EXTRACTION W/ INTRAOCULAR LENS & ANTERIOR VITRECTOMY Left 11/02/26  . CATARACT EXTRACTION W/ INTRAOCULAR LENS IMPLANT Right 09/28/2016  . LUMBAR DISC SURGERY     x 2  . ORIF ANKLE FRACTURE Right 11/13/2018   [facility]  . REPLACEMENT TOTAL KNEE Bilateral    2 on left and 1 on right, Dr Wynelle Link  . ROTATOR CUFF REPAIR Right   . TONSILLECTOMY    . VEIN LIGATION AND STRIPPING      Family History  Problem Relation Age of Onset  . Hypertension Mother   . Atrial fibrillation Mother   . Diabetes Mother   . Hyperlipidemia Mother   .  Diabetes type II Father   . Lung cancer Father   . AAA (abdominal aortic aneurysm) Father   . Depression Daughter   . Diabetes Daughter   . Diabetes Son   . Depression Son   . Depression Son   . Depression Son   . Colon cancer Neg Hx     Social History   Socioeconomic History  . Marital status: Married    Spouse name: Not on file  . Number of children: 5  . Years of education: Not on file  . Highest education level: Not on file  Occupational History  . Occupation: retired  Tobacco Use  . Smoking status: Never Smoker  . Smokeless tobacco: Never Used  Vaping Use  . Vaping Use: Never used  Substance and Sexual Activity  . Alcohol use: Yes    Comment: Occasionally, wine  . Drug use: No    Comment: Caffeine: Coffee -2-3  cups per day.   Marland Kitchen Sexual activity: Not Currently  Other Topics Concern  . Not on file  Social History Narrative  . Not on file   Social Determinants of Health   Financial Resource Strain: Not on file  Food Insecurity: Not on file  Transportation Needs: Not on file  Physical Activity: Not on file  Stress: Not on file  Social Connections: Not on file  Intimate Partner Violence: Not on file    Outpatient Medications Prior to Visit  Medication Sig Dispense Refill  . Alcohol Swabs (B-D SINGLE USE SWABS REGULAR) PADS Clean test area with swab before testing. 1 each 3  . alendronate (FOSAMAX) 70 MG tablet TAKE 1 TABLET(70 MG) BY MOUTH 1 TIME A WEEK WITH A FULL GLASS OF WATER AND ON AN EMPTY STOMACH 12 tablet 3  . AMBULATORY NON FORMULARY MEDICATION Dx DM E11.9 - DM shoes - Fax to [phone] 1 each 0  . amLODipine (NORVASC) 10 MG tablet TAKE 1 TABLET EVERY DAY 90 tablet 2  . ammonium lactate (LAC-HYDRIN) 12 % lotion Apply 1 application topically daily. After showers    . aspirin EC 81 MG tablet Take 81 mg by mouth daily.    Marland Kitchen atorvastatin (LIPITOR) 20 MG tablet Take 1 tablet (20 mg total) by mouth daily. 90 tablet 3  . baclofen (LIORESAL) 10 MG tablet  TAKE 1 TABLET BY MOUTH 2  TIMES DAILY AS DIRECTED 180 tablet 1  . Blood Glucose Calibration (PRODIGY CONTROL SOLUTION) Low SOLN Use as directed 1 each 3  . Blood Glucose Monitoring Suppl (PRODIGY AUTOCODE BLOOD GLUCOSE) w/Device KIT 1 strip by Does not apply route daily. 1 kit 0  . buPROPion (WELLBUTRIN XL) 150 MG 24 hr tablet TAKE 1 TABLET BY MOUTH  TWICE DAILY AT 8AM AND 3PM 180 tablet 1  . cholestyramine (QUESTRAN) 4 g packet Take 1 packet (4 g total) by mouth daily. Avoid taking with in 3-4 hours of other medications 60 each 12  . clobetasol cream (TEMOVATE) 0.05 % Apply topically.    . DULoxetine (CYMBALTA) 60 MG capsule TAKE 1 CAPSULE EVERY DAY 90 capsule 2  . furosemide (LASIX) 20 MG tablet TAKE ONE TABLET BY MOUTH DAILY AS NEEDED FOR SWELLING 20 tablet 0  . glucose blood (PRODIGY NO CODING BLOOD GLUC) test strip Use as instructed 100 each 12  . metFORMIN (GLUCOPHAGE) 1000 MG tablet Take 1 tablet (1,000 mg total) by mouth 2 (two) times daily. 180 tablet 3  . metoprolol succinate (TOPROL-XL) 25 MG 24 hr tablet Take 1 tablet (25 mg total) by mouth at bedtime. For Blood pressure 90 tablet 1  . OXYGEN Inhale 2 L/min into the lungs at bedtime. O2 concentrator    . Prodigy Twist Top Lancets 28G MISC Use as directed. 1 each 3  . RESTASIS 0.05 % ophthalmic emulsion     . sitaGLIPtin (JANUVIA) 50 MG tablet Take 1 tablet (50 mg total) by mouth daily. 90 tablet 1  . spironolactone (ALDACTONE) 25 MG tablet Take 1 tablet (25 mg total) by mouth daily. 30 tablet 2  . zolpidem (AMBIEN) 5 MG tablet TAKE 1 TABLET BY MOUTH AT BEDTIME AS NEEDED FOR SLEEP 30 tablet 0   No facility-administered medications prior to visit.    Allergies  Allergen Reactions  . Losartan Other (See Comments) and Hives    Cough  Cough   . Gabapentin Other (See Comments)    dizziness  . Lisinopril Other (See Comments)    Dry cough  . Oxycodone Nausea And Vomiting  . Oxycodone-Aspirin Nausea And Vomiting    ROS Review of  Systems    Objective:    Physical Exam Constitutional:      Appearance: She is well-developed and well-nourished.  HENT:     Head: Normocephalic and atraumatic.  Cardiovascular:     Rate and Rhythm: Normal rate and regular rhythm.  Heart sounds: Normal heart sounds.  Pulmonary:     Effort: Pulmonary effort is normal.     Breath sounds: Normal breath sounds.  Skin:    General: Skin is warm and dry.  Neurological:     Mental Status: She is alert and oriented to person, place, and time.  Psychiatric:        Mood and Affect: Mood and affect normal.        Behavior: Behavior normal.     BP (!) 122/55   Pulse 75   Ht '5\' 5"'  (1.651 m)   Wt 259 lb (117.5 kg)   SpO2 98%   BMI 43.10 kg/m  Wt Readings from Last 3 Encounters:  11/12/20 259 lb (117.5 kg)  08/24/20 262 lb (118.8 kg)  08/09/20 260 lb 2.3 oz (118 kg)     Health Maintenance Due  Topic Date Due  . Hepatitis C Screening  Never done  . URINE MICROALBUMIN  07/25/2018  . FOOT EXAM  05/18/2019  . COVID-19 Vaccine (3 - Booster for Pfizer series) 07/02/2020    There are no preventive care reminders to display for this patient.  Lab Results  Component Value Date   TSH 1.66 12/17/2019   Lab Results  Component Value Date   WBC 8.8 12/17/2019   HGB 13.4 12/17/2019   HCT 42.1 12/17/2019   MCV 91.9 12/17/2019   PLT 279 12/17/2019   Lab Results  Component Value Date   NA 145 12/17/2019   K 4.5 12/17/2019   CO2 26 12/17/2019   GLUCOSE 165 (H) 12/17/2019   BUN 24 12/17/2019   CREATININE 1.36 (H) 12/17/2019   BILITOT 0.3 12/17/2019   ALKPHOS 81 05/08/2017   AST 10 12/17/2019   ALT 9 12/17/2019   PROT 6.6 12/17/2019   ALBUMIN 4.0 05/08/2017   CALCIUM 9.5 12/17/2019   ANIONGAP 6 07/22/2016   GFR 87.26 07/29/2013   Lab Results  Component Value Date   CHOL 154 06/30/2020   Lab Results  Component Value Date   HDL 51 06/30/2020   Lab Results  Component Value Date   LDLCALC 75 06/30/2020   Lab  Results  Component Value Date   TRIG 186 (H) 06/30/2020   Lab Results  Component Value Date   CHOLHDL 3.0 06/30/2020   Lab Results  Component Value Date   HGBA1C 6.4 (A) 11/12/2020      Assessment & Plan:   Problem List Items Addressed This Visit      Cardiovascular and Mediastinum   Essential hypertension - Primary    Blood pressure looks great today.        Endocrine   Diabetes mellitus, type II ([place])    A1c looks phenomenal today at 6.4 which is significantly improved from previous of 8.1.  She reports that she is not skipping any meals but also reports she is really not eating the best.  Just encouraged her to try to eat more healthy foods.      Relevant Orders   POCT glycosylated hemoglobin (Hb A1C) (Completed)     Other   Insomnia    Currently on Ambien.  She has been taking an extra half a tab because she is had difficulty getting asleep since her husband passed away a couple of weeks ago.  We discussed short-term increase in her medication but safety data shows it really is best at 5 mg particularly for women over the age of 9.  But again we will increase dose just  for the short-term.      Relevant Medications   zolpidem (AMBIEN) 5 MG tablet    Other Visit Diagnoses    Grief         Grief -did encourage her to think about getting chemotherapy/counseling possibly even through hospice.  Right now she feels like she is just dealing with a lot of paperwork.  But did encourage her to also lean on friends and family and to reach out here if needed.  Meds ordered this encounter  Medications  . zolpidem (AMBIEN) 5 MG tablet    Sig: TAKE 1.5 TABLETS BY MOUTH AT BEDTIME AS NEEDED FOR SLEEP    Dispense:  45 tablet    Refill:  0    Please deliver    Follow-up: Return in about 3 months (around 02/09/2021) for Diabetes follow-up.    Beatrice Lecher, MD

## 2020-11-12 NOTE — Assessment & Plan Note (Signed)
A1c looks phenomenal today at 6.4 which is significantly improved from previous of 8.1.  She reports that she is not skipping any meals but also reports she is really not eating the best.  Just encouraged her to try to eat more healthy foods.

## 2020-11-16 NOTE — Assessment & Plan Note (Signed)
Blood pressure looks great today. 

## 2020-12-05 ENCOUNTER — Other Ambulatory Visit: Payer: Self-pay | Admitting: Family Medicine

## 2020-12-05 DIAGNOSIS — I10 Essential (primary) hypertension: Secondary | ICD-10-CM

## 2020-12-10 DIAGNOSIS — L219 Seborrheic dermatitis, unspecified: Secondary | ICD-10-CM | POA: Diagnosis not present

## 2021-01-11 ENCOUNTER — Other Ambulatory Visit: Payer: Self-pay | Admitting: *Deleted

## 2021-01-11 DIAGNOSIS — I10 Essential (primary) hypertension: Secondary | ICD-10-CM

## 2021-01-11 MED ORDER — METOPROLOL SUCCINATE ER 25 MG PO TB24: 25.0000 mg | ORAL_TABLET | Freq: Every day | ORAL | 0 refills | Status: DC

## 2021-01-11 NOTE — Progress Notes (Signed)
7 day supply of Metoprolol sent to local pharmacy per request of [facility] while pt awaits shipment to be delivered from [facility].

## 2021-01-13 ENCOUNTER — Ambulatory Visit: Payer: Medicare PPO | Admitting: Family Medicine

## 2021-01-26 ENCOUNTER — Encounter: Payer: Self-pay | Admitting: Family Medicine

## 2021-01-26 ENCOUNTER — Telehealth: Payer: Self-pay | Admitting: Family Medicine

## 2021-01-26 ENCOUNTER — Ambulatory Visit: Payer: Medicare PPO | Admitting: Family Medicine

## 2021-01-26 ENCOUNTER — Other Ambulatory Visit: Payer: Self-pay

## 2021-01-26 VITALS — BP 119/49 | HR 70 | Ht 65.0 in | Wt 254.0 lb

## 2021-01-26 DIAGNOSIS — I10 Essential (primary) hypertension: Secondary | ICD-10-CM

## 2021-01-26 DIAGNOSIS — R531 Weakness: Secondary | ICD-10-CM

## 2021-01-26 DIAGNOSIS — F4321 Adjustment disorder with depressed mood: Secondary | ICD-10-CM | POA: Insufficient documentation

## 2021-01-26 DIAGNOSIS — R5383 Other fatigue: Secondary | ICD-10-CM

## 2021-01-26 DIAGNOSIS — F5101 Primary insomnia: Secondary | ICD-10-CM | POA: Diagnosis not present

## 2021-01-26 DIAGNOSIS — E119 Type 2 diabetes mellitus without complications: Secondary | ICD-10-CM | POA: Diagnosis not present

## 2021-01-26 DIAGNOSIS — R42 Dizziness and giddiness: Secondary | ICD-10-CM | POA: Diagnosis not present

## 2021-01-26 MED ORDER — FUROSEMIDE 20 MG PO TABS: ORAL_TABLET | ORAL | 0 refills | Status: DC

## 2021-01-26 MED ORDER — FREESTYLE LIBRE 14 DAY READER DEVI: 1.0000 | Freq: Four times a day (QID) | 0 refills | Status: DC | PRN

## 2021-01-26 MED ORDER — ZOLPIDEM TARTRATE 5 MG PO TABS: ORAL_TABLET | ORAL | 1 refills | Status: DC

## 2021-01-26 MED ORDER — SPIRONOLACTONE 25 MG PO TABS: 25.0000 mg | ORAL_TABLET | Freq: Every day | ORAL | 0 refills | Status: DC

## 2021-01-26 MED ORDER — SITAGLIPTIN PHOSPHATE 50 MG PO TABS: 50.0000 mg | ORAL_TABLET | Freq: Every day | ORAL | 1 refills | Status: DC

## 2021-01-26 MED ORDER — FREESTYLE LIBRE 14 DAY SENSOR MISC: 1.0000 | Freq: Four times a day (QID) | 2 refills | Status: DC | PRN

## 2021-01-26 MED ORDER — AMBULATORY NON FORMULARY MEDICATION: 0 refills | Status: DC

## 2021-01-26 NOTE — Assessment & Plan Note (Signed)
Think significant sleep disruption.  We did refill her Ambien to take 1-1/2 tabs which seems to be helpful for her.  Takes as a muscle relaxer twice a day so want to be careful about excess sedation.  May need to consider therapy/grief counseling if not improving.

## 2021-01-26 NOTE — Addendum Note (Signed)
Addended byAnnamaria Helling on: 01/26/2021 02:53 PM   Modules accepted: Orders

## 2021-01-26 NOTE — Addendum Note (Signed)
Addended byAnnamaria Helling on: 01/26/2021 04:18 PM   Modules accepted: Orders

## 2021-01-26 NOTE — Progress Notes (Signed)
Established Patient Office Visit  Subjective:  Patient ID: Carolyn Lambert, female    DOB: 08-27-1941  Age: 80 y.o. MRN: [id number]  CC:  Chief Complaint  Patient presents with  . Weakness   HPI [name] presents for Generalized weakness and dizziness for about a month.  She also says she has not been able to sleep in about a month she is only getting about 2 to 3 hours each night oftentimes does not fall asleep until 4 or 5 AM.  Is having to take 1-1/2 tabs of her Ambien to get a good night sleep.  She is just really struggled since her husband passed away suddenly.  She really has not been reaching out to friends or family but knows that she needs to.  Follow-up diabetes-she also needs her Januvia sent through mail order and will be much cheaper.  She also needs a new prescription for freestyle versus Dexcom.  She noted that her blood pressure was low today but has not been checking it at home.  She says if she is up and trying to do too much she will just suddenly feel weak in her legs and then start to feel lightheaded.  No chest pain.  No leg cramps.  Past Medical History:  Diagnosis Date  . Anxiety and depression   . Arthritis   . Chronic fatigue syndrome   . Diabetes mellitus   . DVT (deep venous thrombosis) ([place])    s/p knee replacmet.   . Fibromyalgia   . GERD (gastroesophageal reflux disease)   . Hypercholesteremia   . Hypertension   . Major depressive disorder 03/20/2012  . Obesity   . Ovarian cancer ([place])   . Peripheral edema   . Sinusitis July 2013  . Sleep apnea    O2 concentrator at night    Past Surgical History:  Procedure Laterality Date  . ABDOMINAL HYSTERECTOMY    . CATARACT EXTRACTION W/ INTRAOCULAR LENS & ANTERIOR VITRECTOMY Left 11/02/26  . CATARACT EXTRACTION W/ INTRAOCULAR LENS IMPLANT Right 09/28/2016  . LUMBAR DISC SURGERY     x 2  . ORIF ANKLE FRACTURE Right 11/13/2018   [facility]  . REPLACEMENT TOTAL KNEE Bilateral    2 on  left and 1 on right, Dr Wynelle Link  . ROTATOR CUFF REPAIR Right   . TONSILLECTOMY    . VEIN LIGATION AND STRIPPING      Family History  Problem Relation Age of Onset  . Hypertension Mother   . Atrial fibrillation Mother   . Diabetes Mother   . Hyperlipidemia Mother   . Diabetes type II Father   . Lung cancer Father   . AAA (abdominal aortic aneurysm) Father   . Depression Daughter   . Diabetes Daughter   . Diabetes Son   . Depression Son   . Depression Son   . Depression Son   . Colon cancer Neg Hx     Social History   Socioeconomic History  . Marital status: Married    Spouse name: Not on file  . Number of children: 5  . Years of education: Not on file  . Highest education level: Not on file  Occupational History  . Occupation: retired  Tobacco Use  . Smoking status: Never Smoker  . Smokeless tobacco: Never Used  Vaping Use  . Vaping Use: Never used  Substance and Sexual Activity  . Alcohol use: Yes    Comment: Occasionally, wine  . Drug use: No  Comment: Caffeine: Coffee -2-3  cups per day.   Marland Kitchen Sexual activity: Not Currently  Other Topics Concern  . Not on file  Social History Narrative  . Not on file   Social Determinants of Health   Financial Resource Strain: Not on file  Food Insecurity: Not on file  Transportation Needs: Not on file  Physical Activity: Not on file  Stress: Not on file  Social Connections: Not on file  Intimate Partner Violence: Not on file    Outpatient Medications Prior to Visit  Medication Sig Dispense Refill  . Alcohol Swabs (B-D SINGLE USE SWABS REGULAR) PADS Clean test area with swab before testing. 1 each 3  . alendronate (FOSAMAX) 70 MG tablet TAKE 1 TABLET(70 MG) BY MOUTH 1 TIME A WEEK WITH A FULL GLASS OF WATER AND ON AN EMPTY STOMACH 12 tablet 3  . AMBULATORY NON FORMULARY MEDICATION Dx DM E11.9 - DM shoes - Fax to [phone] 1 each 0  . amLODipine (NORVASC) 10 MG tablet TAKE 1 TABLET EVERY DAY 90 tablet 2  .  ammonium lactate (LAC-HYDRIN) 12 % lotion Apply 1 application topically daily. After showers    . aspirin EC 81 MG tablet Take 81 mg by mouth daily.    Marland Kitchen atorvastatin (LIPITOR) 20 MG tablet Take 1 tablet (20 mg total) by mouth daily. 90 tablet 3  . baclofen (LIORESAL) 10 MG tablet TAKE 1 TABLET BY MOUTH 2  TIMES DAILY AS DIRECTED 180 tablet 1  . Blood Glucose Calibration (PRODIGY CONTROL SOLUTION) Low SOLN Use as directed 1 each 3  . Blood Glucose Monitoring Suppl (PRODIGY AUTOCODE BLOOD GLUCOSE) w/Device KIT 1 strip by Does not apply route daily. 1 kit 0  . buPROPion (WELLBUTRIN XL) 150 MG 24 hr tablet TAKE 1 TABLET BY MOUTH  TWICE DAILY AT 8AM AND 3PM 180 tablet 1  . DULoxetine (CYMBALTA) 60 MG capsule TAKE 1 CAPSULE EVERY DAY 90 capsule 2  . furosemide (LASIX) 20 MG tablet TAKE ONE TABLET BY MOUTH DAILY AS NEEDED FOR SWELLING 20 tablet 0  . glucose blood (PRODIGY NO CODING BLOOD GLUC) test strip Use as instructed 100 each 12  . metFORMIN (GLUCOPHAGE) 1000 MG tablet Take 1 tablet (1,000 mg total) by mouth 2 (two) times daily. 180 tablet 3  . metoprolol succinate (TOPROL-XL) 25 MG 24 hr tablet Take 1 tablet (25 mg total) by mouth at bedtime. For Blood pressure 7 tablet 0  . OXYGEN Inhale 2 L/min into the lungs at bedtime. O2 concentrator    . Prodigy Twist Top Lancets 28G MISC Use as directed. 1 each 3  . RESTASIS 0.05 % ophthalmic emulsion     . spironolactone (ALDACTONE) 25 MG tablet Take 1 tablet (25 mg total) by mouth daily. 30 tablet 2  . cholestyramine (QUESTRAN) 4 g packet Take 1 packet (4 g total) by mouth daily. Avoid taking with in 3-4 hours of other medications 60 each 12  . clobetasol cream (TEMOVATE) 0.05 % Apply topically.    . sitaGLIPtin (JANUVIA) 50 MG tablet Take 1 tablet (50 mg total) by mouth daily. 90 tablet 1  . zolpidem (AMBIEN) 5 MG tablet TAKE 1.5 TABLETS BY MOUTH AT BEDTIME AS NEEDED FOR SLEEP 45 tablet 0   No facility-administered medications prior to visit.     Allergies  Allergen Reactions  . Losartan Other (See Comments) and Hives    Cough  Cough   . Gabapentin Other (See Comments)    dizziness  . Lisinopril Other (  See Comments)    Dry cough  . Oxycodone Nausea And Vomiting  . Oxycodone-Aspirin Nausea And Vomiting    ROS Review of Systems    Objective:    Physical Exam Constitutional:      Appearance: She is well-developed.  HENT:     Head: Normocephalic and atraumatic.  Cardiovascular:     Rate and Rhythm: Normal rate and regular rhythm.     Heart sounds: Normal heart sounds.  Pulmonary:     Effort: Pulmonary effort is normal.     Breath sounds: Normal breath sounds.  Skin:    General: Skin is warm and dry.  Neurological:     Mental Status: She is alert and oriented to person, place, and time.  Psychiatric:        Behavior: Behavior normal.     BP (!) 119/49   Pulse 70   Ht '5\' 5"'  (1.651 m)   Wt 254 lb (115.2 kg)   SpO2 98%   BMI 42.27 kg/m  Wt Readings from Last 3 Encounters:  01/26/21 254 lb (115.2 kg)  11/12/20 259 lb (117.5 kg)  08/24/20 262 lb (118.8 kg)     Health Maintenance Due  Topic Date Due  . URINE MICROALBUMIN  07/25/2018  . FOOT EXAM  05/18/2019    There are no preventive care reminders to display for this patient.  Lab Results  Component Value Date   TSH 1.66 12/17/2019   Lab Results  Component Value Date   WBC 8.8 12/17/2019   HGB 13.4 12/17/2019   HCT 42.1 12/17/2019   MCV 91.9 12/17/2019   PLT 279 12/17/2019   Lab Results  Component Value Date   NA 145 12/17/2019   K 4.5 12/17/2019   CO2 26 12/17/2019   GLUCOSE 165 (H) 12/17/2019   BUN 24 12/17/2019   CREATININE 1.36 (H) 12/17/2019   BILITOT 0.3 12/17/2019   ALKPHOS 81 05/08/2017   AST 10 12/17/2019   ALT 9 12/17/2019   PROT 6.6 12/17/2019   ALBUMIN 4.0 05/08/2017   CALCIUM 9.5 12/17/2019   ANIONGAP 6 07/22/2016   GFR 87.26 07/29/2013   Lab Results  Component Value Date   CHOL 154 06/30/2020   Lab  Results  Component Value Date   HDL 51 06/30/2020   Lab Results  Component Value Date   LDLCALC 75 06/30/2020   Lab Results  Component Value Date   TRIG 186 (H) 06/30/2020   Lab Results  Component Value Date   CHOLHDL 3.0 06/30/2020   Lab Results  Component Value Date   HGBA1C 6.4 (A) 11/12/2020      Assessment & Plan:   Problem List Items Addressed This Visit      Cardiovascular and Mediastinum   Essential hypertension    Pressure is actually little bit low today.  On that I have her cut her amlodipine in half.      Relevant Medications   zolpidem (AMBIEN) 5 MG tablet   Other Relevant Orders   CBC   COMPLETE METABOLIC PANEL WITH GFR   TSH     Endocrine   Diabetes mellitus, type II ([place])    We will go ahead and send Januvia for 90-day supply.  Follow-up in 1 month for diabetic checkup.      Relevant Medications   sitaGLIPtin (JANUVIA) 50 MG tablet     Other   Insomnia    She does well with Ambien but more recently has been taking 1-1/2 tabs.  It  seems to be effective worse if she only takes 5 mg she is only getting a few hours of sleep.  Okay to continue 7.5 mg for at least the next month but we did discuss the importance of getting back down to what is considered the most safe and effective dose.  I do think some of this is transient with the recent sudden death of her husband.  But we may need to consider therapy/counseling      Relevant Medications   zolpidem (AMBIEN) 5 MG tablet   Grief    Think significant sleep disruption.  We did refill her Ambien to take 1-1/2 tabs which seems to be helpful for her.  Takes as a muscle relaxer twice a day so want to be careful about excess sedation.  May need to consider therapy/grief counseling if not improving.       Other Visit Diagnoses    Fatigue, unspecified type    -  Primary   Relevant Medications   zolpidem (AMBIEN) 5 MG tablet   Other Relevant Orders   CBC   COMPLETE METABOLIC PANEL WITH GFR   TSH    General weakness       Relevant Medications   zolpidem (AMBIEN) 5 MG tablet   Other Relevant Orders   CBC   COMPLETE METABOLIC PANEL WITH GFR   TSH   Dizziness       Relevant Medications   zolpidem (AMBIEN) 5 MG tablet   Other Relevant Orders   CBC   COMPLETE METABOLIC PANEL WITH GFR   TSH     Dizziness-she is describing more of a lightheaded sensation versus true vertigo.  This could be related to her low blood pressure.  It looks like her weight is down about 5 pounds so she may not be eating and drinking as much as she was previously.  Try to check on the order for her diabetic shoes.  Looking at her recent prescriptions it looks like we had sent 1 almost a year ago not sure what happened.  Meds ordered this encounter  Medications  . sitaGLIPtin (JANUVIA) 50 MG tablet    Sig: Take 1 tablet (50 mg total) by mouth daily.    Dispense:  90 tablet    Refill:  1  . zolpidem (AMBIEN) 5 MG tablet    Sig: TAKE 1.5 TABLETS BY MOUTH AT BEDTIME AS NEEDED FOR SLEEP    Dispense:  45 tablet    Refill:  1    Please deliver    Follow-up: Return in about 4 weeks (around 02/23/2021) for Diabetes follow-up and sleep .    Beatrice Lecher, MD

## 2021-01-26 NOTE — Addendum Note (Signed)
Addended byAnnamaria Helling on: 01/26/2021 04:51 PM   Modules accepted: Orders

## 2021-01-26 NOTE — Telephone Encounter (Signed)
Patient says that she has not heard back about her diabetic shoes.  It looks like we had sent a prescription please call to check on this to see if we might need to fill out additional paperwork etc. or just refax the paperwork we already have.

## 2021-01-26 NOTE — Assessment & Plan Note (Signed)
Pressure is actually little bit low today.  On that I have her cut her amlodipine in half.

## 2021-01-26 NOTE — Assessment & Plan Note (Signed)
We will go ahead and send Januvia for 90-day supply.  Follow-up in 1 month for diabetic checkup.

## 2021-01-26 NOTE — Patient Instructions (Signed)
Please cut your amlodipine in half.   

## 2021-01-26 NOTE — Assessment & Plan Note (Signed)
She does well with Ambien but more recently has been taking 1-1/2 tabs.  It seems to be effective worse if she only takes 5 mg she is only getting a few hours of sleep.  Okay to continue 7.5 mg for at least the next month but we did discuss the importance of getting back down to what is considered the most safe and effective dose.  I do think some of this is transient with the recent sudden death of her husband.  But we may need to consider therapy/counseling

## 2021-01-27 ENCOUNTER — Telehealth: Payer: Self-pay | Admitting: Neurology

## 2021-01-27 NOTE — Telephone Encounter (Signed)
Patient left vm stating she was told to come back in a month, but already scheduled for May 3rd. She wants to know if she should keep appt or move this back? She is still having shaking. Please advise. [phone].

## 2021-01-27 NOTE — Telephone Encounter (Signed)
Tried to resend RX for shoes, would not go thru. Will try again later.

## 2021-01-27 NOTE — Telephone Encounter (Signed)
Diabetic shoe order resent to fax number [phone] with confirmation received. They should contact patient directly.

## 2021-01-28 DIAGNOSIS — I10 Essential (primary) hypertension: Secondary | ICD-10-CM | POA: Diagnosis not present

## 2021-01-28 DIAGNOSIS — R531 Weakness: Secondary | ICD-10-CM | POA: Diagnosis not present

## 2021-01-28 DIAGNOSIS — R42 Dizziness and giddiness: Secondary | ICD-10-CM | POA: Diagnosis not present

## 2021-01-28 DIAGNOSIS — R5383 Other fatigue: Secondary | ICD-10-CM | POA: Diagnosis not present

## 2021-01-28 NOTE — Telephone Encounter (Signed)
plesae push out f/u date to around 5/17

## 2021-01-29 LAB — CBC
HCT: 40 % (ref 35.0–45.0)
Hemoglobin: 12.9 g/dL (ref 11.7–15.5)
MCH: 30.7 pg (ref 27.0–33.0)
MCHC: 32.3 g/dL (ref 32.0–36.0)
MCV: 95.2 fL (ref 80.0–100.0)
MPV: 10.9 fL (ref 7.5–12.5)
Platelets: 263 10*3/uL (ref 140–400)
RBC: 4.2 10*6/uL (ref 3.80–5.10)
RDW: 13.1 % (ref 11.0–15.0)
WBC: 10.2 10*3/uL (ref 3.8–10.8)

## 2021-01-29 LAB — COMPLETE METABOLIC PANEL WITH GFR
AG Ratio: 1.6 (calc) (ref 1.0–2.5)
ALT: 14 U/L (ref 6–29)
AST: 13 U/L (ref 10–35)
Albumin: 4.2 g/dL (ref 3.6–5.1)
Alkaline phosphatase (APISO): 82 U/L (ref 37–153)
BUN/Creatinine Ratio: 25 (calc) — ABNORMAL HIGH (ref 6–22)
BUN: 48 mg/dL — ABNORMAL HIGH (ref 7–25)
CO2: 21 mmol/L (ref 20–32)
Calcium: 9.8 mg/dL (ref 8.6–10.4)
Chloride: 103 mmol/L (ref 98–110)
Creat: 1.9 mg/dL — ABNORMAL HIGH (ref 0.60–0.88)
GFR, Est African American: 28 mL/min/{1.73_m2} — ABNORMAL LOW (ref >=60)
GFR, Est Non African American: 24 mL/min/{1.73_m2} — ABNORMAL LOW (ref >=60)
Globulin: 2.6 g/dL (calc) (ref 1.9–3.7)
Glucose, Bld: 252 mg/dL — ABNORMAL HIGH (ref 65–99)
Potassium: 4.9 mmol/L (ref 3.5–5.3)
Sodium: 140 mmol/L (ref 135–146)
Total Bilirubin: 0.5 mg/dL (ref 0.2–1.2)
Total Protein: 6.8 g/dL (ref 6.1–8.1)

## 2021-01-29 LAB — TSH: TSH: 2.18 mIU/L (ref 0.40–4.50)

## 2021-01-29 NOTE — Telephone Encounter (Signed)
Can we call patient to reschedule her appt?

## 2021-01-29 NOTE — Telephone Encounter (Signed)
Appt date has been changed to 02/23/21.AM

## 2021-02-01 ENCOUNTER — Other Ambulatory Visit: Payer: Self-pay | Admitting: *Deleted

## 2021-02-01 DIAGNOSIS — R7989 Other specified abnormal findings of blood chemistry: Secondary | ICD-10-CM

## 2021-02-09 ENCOUNTER — Ambulatory Visit: Payer: Self-pay | Admitting: Family Medicine

## 2021-02-09 DIAGNOSIS — R7989 Other specified abnormal findings of blood chemistry: Secondary | ICD-10-CM | POA: Diagnosis not present

## 2021-02-09 LAB — BASIC METABOLIC PANEL
BUN/Creatinine Ratio: 24 (calc) — ABNORMAL HIGH (ref 6–22)
BUN: 44 mg/dL — ABNORMAL HIGH (ref 7–25)
CO2: 21 mmol/L (ref 20–32)
Calcium: 10 mg/dL (ref 8.6–10.4)
Chloride: 105 mmol/L (ref 98–110)
Creat: 1.83 mg/dL — ABNORMAL HIGH (ref 0.60–0.88)
Glucose, Bld: 195 mg/dL — ABNORMAL HIGH (ref 65–99)
Potassium: 4.8 mmol/L (ref 3.5–5.3)
Sodium: 140 mmol/L (ref 135–146)

## 2021-02-12 ENCOUNTER — Telehealth: Payer: Self-pay | Admitting: Gastroenterology

## 2021-02-12 NOTE — Telephone Encounter (Signed)
Patient returned call. I informed of the number to call regarding the lab questions.

## 2021-02-12 NOTE — Telephone Encounter (Signed)
Left message for pt to call the lab regarding her questions about stool specimen. Left number [phone] for her to call.

## 2021-02-12 NOTE — Telephone Encounter (Signed)
Inbound call from patient. Stating she need instructions for stool sample. Best contact number [phone]

## 2021-02-16 ENCOUNTER — Telehealth: Payer: Self-pay | Admitting: Podiatry

## 2021-02-16 ENCOUNTER — Other Ambulatory Visit: Payer: Self-pay | Admitting: *Deleted

## 2021-02-16 DIAGNOSIS — I10 Essential (primary) hypertension: Secondary | ICD-10-CM

## 2021-02-16 MED ORDER — METOPROLOL SUCCINATE ER 25 MG PO TB24: 25.0000 mg | ORAL_TABLET | Freq: Every day | ORAL | 1 refills | Status: DC

## 2021-02-16 NOTE — Telephone Encounter (Signed)
Pt left message stating she needed to cancel her appt for Friday for the brace.   I returned call to confirm and she did cancel stated Friday's are not good for her and will call to r/s

## 2021-02-19 ENCOUNTER — Other Ambulatory Visit: Payer: Medicare PPO

## 2021-02-23 ENCOUNTER — Ambulatory Visit: Payer: Medicare PPO | Admitting: Family Medicine

## 2021-02-23 ENCOUNTER — Encounter: Payer: Self-pay | Admitting: Family Medicine

## 2021-02-23 ENCOUNTER — Other Ambulatory Visit: Payer: Self-pay

## 2021-02-23 VITALS — BP 95/50 | HR 94 | Ht 65.0 in | Wt 254.0 lb

## 2021-02-23 DIAGNOSIS — I10 Essential (primary) hypertension: Secondary | ICD-10-CM | POA: Diagnosis not present

## 2021-02-23 DIAGNOSIS — Z6841 Body Mass Index (BMI) 40.0 and over, adult: Secondary | ICD-10-CM

## 2021-02-23 DIAGNOSIS — E119 Type 2 diabetes mellitus without complications: Secondary | ICD-10-CM | POA: Diagnosis not present

## 2021-02-23 DIAGNOSIS — R635 Abnormal weight gain: Secondary | ICD-10-CM

## 2021-02-23 DIAGNOSIS — I959 Hypotension, unspecified: Secondary | ICD-10-CM

## 2021-02-23 LAB — POCT GLYCOSYLATED HEMOGLOBIN (HGB A1C): Hemoglobin A1C: 5.9 % — AB (ref 4.0–5.6)

## 2021-02-23 MED ORDER — METFORMIN HCL 1000 MG PO TABS: 1000.0000 mg | ORAL_TABLET | Freq: Two times a day (BID) | ORAL | 3 refills | Status: DC

## 2021-02-23 MED ORDER — BUPROPION HCL ER (XL) 150 MG PO TB24
ORAL_TABLET | ORAL | 1 refills | Status: DC
Start: 2021-02-23 — End: 2021-02-26

## 2021-02-23 NOTE — Assessment & Plan Note (Signed)
1 see looks phenomenal today at 5.9.  So organ to discontinue the Januvia and go back to just plain metformin.  Especially if she decides to go on weight watchers and start really restricting her food intake I do want her to start having hypoglycemia.

## 2021-02-23 NOTE — Patient Instructions (Signed)
Please stop the amlodipine.  Ok to stop the Januvia when you run out, or sooner if you start losing weight.

## 2021-02-23 NOTE — Assessment & Plan Note (Signed)
We will stop amlodipine completely and have her check her blood pressure at home or she can follow-up for nurse visit in about a week.  Either way just let us know if it still low and we can make another adjustment to the medication if needed.

## 2021-02-23 NOTE — Progress Notes (Signed)
Established Patient Office Visit  Subjective:  Patient ID: Carolyn Lambert, female    DOB: 10/08/1941  Age: 80 y.o. MRN: [id number]  CC:  Chief Complaint  Patient presents with  . Diabetes  . Hypertension    HPI Carolyn Lambert presents for   Diabetes - no hypoglycemic events. No wounds or sores that are not healing well. No increased thirst or urination. Checking glucose at home. Taking medications as prescribed without any side effects.  She is currently on Januvia and metformin and she just refilled the Januvia for 90-day supply.  She is been tolerating it well.  She says she would like referral to endocrinology but she really wants to work on weight loss.  Though she is planning on getting back into weight watchers and says [facility] she knows she needs to really just get out of the house in general.  Hypertension- Pt denies chest pain, SOB, dizziness, or heart palpitations.  Taking meds as directed w/o problems.  Denies medication side effects.    Hypotension-we also had her cut her amlodipine in half when I saw her about 4 weeks ago.  Past Medical History:  Diagnosis Date  . Anxiety and depression   . Arthritis   . Chronic fatigue syndrome   . Diabetes mellitus   . DVT (deep venous thrombosis) ([place])    s/p knee replacmet.   . Fibromyalgia   . GERD (gastroesophageal reflux disease)   . Hypercholesteremia   . Hypertension   . Major depressive disorder 03/20/2012  . Obesity   . Ovarian cancer ([place])   . Peripheral edema   . Sinusitis July 2013  . Sleep apnea    O2 concentrator at night    Past Surgical History:  Procedure Laterality Date  . ABDOMINAL HYSTERECTOMY    . CATARACT EXTRACTION W/ INTRAOCULAR LENS & ANTERIOR VITRECTOMY Left 11/02/26  . CATARACT EXTRACTION W/ INTRAOCULAR LENS IMPLANT Right 09/28/2016  . LUMBAR DISC SURGERY     x 2  . ORIF ANKLE FRACTURE Right 11/13/2018   [facility]  . REPLACEMENT TOTAL KNEE  Bilateral    2 on left and 1 on right, Dr Wynelle Link  . ROTATOR CUFF REPAIR Right   . TONSILLECTOMY    . VEIN LIGATION AND STRIPPING      Family History  Problem Relation Age of Onset  . Hypertension Mother   . Atrial fibrillation Mother   . Diabetes Mother   . Hyperlipidemia Mother   . Diabetes type II Father   . Lung cancer Father   . AAA (abdominal aortic aneurysm) Father   . Depression Daughter   . Diabetes Daughter   . Diabetes Son   . Depression Son   . Depression Son   . Depression Son   . Colon cancer Neg Hx     Social History   Socioeconomic History  . Marital status: Married    Spouse name: Not on file  . Number of children: 5  . Years of education: Not on file  . Highest education level: Not on file  Occupational History  . Occupation: retired  Tobacco Use  . Smoking status: Never Smoker  . Smokeless tobacco: Never Used  Vaping Use  . Vaping Use: Never used  Substance and Sexual Activity  . Alcohol use: Yes    Comment: Occasionally, wine  . Drug use: No    Comment: Caffeine: Coffee -2-3  cups per day.   Marland Kitchen Sexual  activity: Not Currently  Other Topics Concern  . Not on file  Social History Narrative  . Not on file   Social Determinants of Health   Financial Resource Strain: Not on file  Food Insecurity: Not on file  Transportation Needs: Not on file  Physical Activity: Not on file  Stress: Not on file  Social Connections: Not on file  Intimate Partner Violence: Not on file    Outpatient Medications Prior to Visit  Medication Sig Dispense Refill  . Alcohol Swabs (B-D SINGLE USE SWABS REGULAR) PADS Clean test area with swab before testing. 1 each 3  . alendronate (FOSAMAX) 70 MG tablet TAKE 1 TABLET(70 MG) BY MOUTH 1 TIME A WEEK WITH A FULL GLASS OF WATER AND ON AN EMPTY STOMACH 12 tablet 3  . AMBULATORY NON FORMULARY MEDICATION Dx DM E11.9 - DM shoes - Fax to [phone] 1 each 0  . ammonium lactate (LAC-HYDRIN) 12 % lotion Apply 1 application  topically daily. After showers    . aspirin EC 81 MG tablet Take 81 mg by mouth daily.    Marland Kitchen atorvastatin (LIPITOR) 20 MG tablet Take 1 tablet (20 mg total) by mouth daily. 90 tablet 3  . amLODipine (NORVASC) 10 MG tablet TAKE 1 TABLET EVERY DAY 90 tablet 2  . baclofen (LIORESAL) 10 MG tablet TAKE 1 TABLET BY MOUTH 2  TIMES DAILY AS DIRECTED 180 tablet 1  . DULoxetine (CYMBALTA) 60 MG capsule TAKE 1 CAPSULE EVERY DAY 90 capsule 2  . furosemide (LASIX) 20 MG tablet TAKE ONE TABLET BY MOUTH DAILY AS NEEDED FOR SWELLING 90 tablet 0  . metoprolol succinate (TOPROL-XL) 25 MG 24 hr tablet Take 1 tablet (25 mg total) by mouth at bedtime. For Blood pressure 90 tablet 1  . OXYGEN Inhale 2 L/min into the lungs at bedtime. O2 concentrator    . Prodigy Twist Top Lancets 28G MISC Use as directed. 1 each 3  . RESTASIS 0.05 % ophthalmic emulsion     . spironolactone (ALDACTONE) 25 MG tablet Take 1 tablet (25 mg total) by mouth daily. 90 tablet 0  . zolpidem (AMBIEN) 5 MG tablet TAKE 1.5 TABLETS BY MOUTH AT BEDTIME AS NEEDED FOR SLEEP 45 tablet 1  . Blood Glucose Calibration (PRODIGY CONTROL SOLUTION) Low SOLN Use as directed 1 each 3  . Blood Glucose Monitoring Suppl (PRODIGY AUTOCODE BLOOD GLUCOSE) w/Device KIT 1 strip by Does not apply route daily. 1 kit 0  . buPROPion (WELLBUTRIN XL) 150 MG 24 hr tablet TAKE 1 TABLET BY MOUTH  TWICE DAILY AT 8AM AND 3PM 180 tablet 1  . Continuous Blood Gluc Receiver (FREESTYLE LIBRE 14 DAY READER) DEVI 1 Device by Does not apply route 4 (four) times daily as needed (blood sugar check). 1 each 0  . Continuous Blood Gluc Sensor (FREESTYLE LIBRE 14 DAY SENSOR) MISC 1 Device by Does not apply route 4 (four) times daily as needed (check blood sugars). 2 each 2  . glucose blood (PRODIGY NO CODING BLOOD GLUC) test strip Use as instructed 100 each 12  . metFORMIN (GLUCOPHAGE) 1000 MG tablet Take 1 tablet (1,000 mg total) by mouth 2 (two) times daily. 180 tablet 3  . sitaGLIPtin  (JANUVIA) 50 MG tablet Take 1 tablet (50 mg total) by mouth daily. 90 tablet 1   No facility-administered medications prior to visit.    Allergies  Allergen Reactions  . Losartan Other (See Comments) and Hives    Cough  Cough   . Gabapentin  Other (See Comments)    dizziness  . Lisinopril Other (See Comments)    Dry cough  . Oxycodone Nausea And Vomiting  . Oxycodone-Aspirin Nausea And Vomiting    ROS Review of Systems    Objective:    Physical Exam  BP (!) 95/50   Pulse 94   Ht '5\' 5"'  (1.651 m)   Wt 254 lb (115.2 kg)   SpO2 99%   BMI 42.27 kg/m  Wt Readings from Last 3 Encounters:  02/23/21 254 lb (115.2 kg)  01/26/21 254 lb (115.2 kg)  11/12/20 259 lb (117.5 kg)     Health Maintenance Due  Topic Date Due  . URINE MICROALBUMIN  07/25/2018  . FOOT EXAM  05/18/2019  . COVID-19 Vaccine (3 - Booster for Pfizer series) 06/01/2020    There are no preventive care reminders to display for this patient.  Lab Results  Component Value Date   TSH 2.18 01/28/2021   Lab Results  Component Value Date   WBC 10.2 01/28/2021   HGB 12.9 01/28/2021   HCT 40.0 01/28/2021   MCV 95.2 01/28/2021   PLT 263 01/28/2021   Lab Results  Component Value Date   NA 140 02/09/2021   K 4.8 02/09/2021   CO2 21 02/09/2021   GLUCOSE 195 (H) 02/09/2021   BUN 44 (H) 02/09/2021   CREATININE 1.83 (H) 02/09/2021   BILITOT 0.5 01/28/2021   ALKPHOS 81 05/08/2017   AST 13 01/28/2021   ALT 14 01/28/2021   PROT 6.8 01/28/2021   ALBUMIN 4.0 05/08/2017   CALCIUM 10.0 02/09/2021   ANIONGAP 6 07/22/2016   GFR 87.26 07/29/2013   Lab Results  Component Value Date   CHOL 154 06/30/2020   Lab Results  Component Value Date   HDL 51 06/30/2020   Lab Results  Component Value Date   LDLCALC 75 06/30/2020   Lab Results  Component Value Date   TRIG 186 (H) 06/30/2020   Lab Results  Component Value Date   CHOLHDL 3.0 06/30/2020   Lab Results  Component Value Date   HGBA1C 6.4 (A)  11/12/2020      Assessment & Plan:   Problem List Items Addressed This Visit      Cardiovascular and Mediastinum   Essential hypertension - Primary    We will stop amlodipine completely and have her check her blood pressure at home or she can follow-up for nurse visit in about a week.  Either way just let us know if it still low and we can make another adjustment to the medication if needed.        Endocrine   Diabetes mellitus, type II ([place])    1 see looks phenomenal today at 5.9.  So organ to discontinue the Januvia and go back to just plain metformin.  Especially if she decides to go on weight watchers and start really restricting her food intake I do want her to start having hypoglycemia.      Relevant Medications   metFORMIN (GLUCOPHAGE) 1000 MG tablet   Other Relevant Orders   POCT glycosylated hemoglobin (Hb A1C)   Ambulatory referral to Endocrinology     Other   BMI 40.0-44.9, adult [facility])    Would like referral to endocrinology for her diabetes and weight gain.  We did discuss that there are some newer agents that are injectables that can sometimes be helpful but sometimes they are cost prohibitive especially with Medicare plans.  I think starting with weight watchers and possibly  [facility] would be [facility] for her.  Also I think it would be very positive for her mentally especially while she was still struggling with grief      Relevant Medications   metFORMIN (GLUCOPHAGE) 1000 MG tablet    Other Visit Diagnoses    Weight gain       Relevant Orders   Ambulatory referral to Endocrinology   Hypotension, unspecified hypotension type         Hypotension-we will discontinue amlodipine she is already been splitting it in half.  I want her to check her blood pressure couple times at home if she is able to get her cuff to work she thinks the battery might be dead.  Or she is welcome to come to the office in about a week and a half and check her blood  pressure here.  If it still low then we can make an adjustment to one of her other medications.  I do think this could be contributing to some of her fatigue.  Meds ordered this encounter  Medications  . metFORMIN (GLUCOPHAGE) 1000 MG tablet    Sig: Take 1 tablet (1,000 mg total) by mouth 2 (two) times daily.    Dispense:  180 tablet    Refill:  3    Requesting 1 year supply  . buPROPion (WELLBUTRIN XL) 150 MG 24 hr tablet    Sig: TAKE 1 TABLET BY MOUTH  TWICE DAILY AT 8AM AND 3PM    Dispense:  180 tablet    Refill:  1    Follow-up: Return in about 4 weeks (around 03/23/2021) for Hypertension.    Beatrice Lecher, MD

## 2021-02-23 NOTE — Assessment & Plan Note (Signed)
Would like referral to endocrinology for her diabetes and weight gain.  We did discuss that there are some newer agents that are injectables that can sometimes be helpful but sometimes they are cost prohibitive especially with Medicare plans.  I think starting with weight watchers and possibly even [facility] would be really helpful for her.  Also I think it would be very positive for her mentally especially while she was still struggling with grief

## 2021-02-26 ENCOUNTER — Telehealth: Payer: Self-pay | Admitting: *Deleted

## 2021-02-26 DIAGNOSIS — E119 Type 2 diabetes mellitus without complications: Secondary | ICD-10-CM

## 2021-02-26 MED ORDER — METFORMIN HCL 1000 MG PO TABS: 1000.0000 mg | ORAL_TABLET | Freq: Two times a day (BID) | ORAL | 3 refills | Status: DC

## 2021-02-26 MED ORDER — BUPROPION HCL ER (XL) 150 MG PO TB24: ORAL_TABLET | ORAL | 1 refills | Status: DC

## 2021-02-26 MED ORDER — ALENDRONATE SODIUM 70 MG PO TABS: ORAL_TABLET | ORAL | 3 refills | Status: DC

## 2021-02-26 MED ORDER — DULOXETINE HCL 60 MG PO CPEP: 60.0000 mg | ORAL_CAPSULE | Freq: Every day | ORAL | 2 refills | Status: DC

## 2021-02-26 NOTE — Telephone Encounter (Signed)
Pt called and stated that she was supposed to have her medications sent to [facility] NOT to [place].   I did resubmit the prescriptions that were needed to her mail order

## 2021-03-04 ENCOUNTER — Ambulatory Visit: Payer: Medicare PPO

## 2021-03-04 ENCOUNTER — Other Ambulatory Visit: Payer: Self-pay | Admitting: *Deleted

## 2021-03-04 DIAGNOSIS — I10 Essential (primary) hypertension: Secondary | ICD-10-CM

## 2021-03-04 MED ORDER — AMBULATORY NON FORMULARY MEDICATION: 0 refills | Status: DC

## 2021-03-05 ENCOUNTER — Ambulatory Visit: Payer: Medicare PPO

## 2021-03-12 ENCOUNTER — Ambulatory Visit (INDEPENDENT_AMBULATORY_CARE_PROVIDER_SITE_OTHER): Payer: Medicare PPO | Admitting: Family Medicine

## 2021-03-12 ENCOUNTER — Other Ambulatory Visit: Payer: Self-pay

## 2021-03-12 VITALS — BP 124/69 | HR 76

## 2021-03-12 DIAGNOSIS — I10 Essential (primary) hypertension: Secondary | ICD-10-CM

## 2021-03-12 NOTE — Progress Notes (Signed)
2nd BP looks perfect!!!!!! Carolyn Lambert!!!!!

## 2021-03-12 NOTE — Progress Notes (Signed)
Pt in for bp check this afternoon since having a very low reading in office last time.  First reading today was 151/72 and after sitting for 10 minutes her reading came down to 124/69. Pt has appointment with you Tues June 7th.

## 2021-03-15 DIAGNOSIS — L299 Pruritus, unspecified: Secondary | ICD-10-CM | POA: Diagnosis not present

## 2021-03-15 DIAGNOSIS — L219 Seborrheic dermatitis, unspecified: Secondary | ICD-10-CM | POA: Diagnosis not present

## 2021-03-16 ENCOUNTER — Encounter: Payer: Self-pay | Admitting: Family Medicine

## 2021-03-16 ENCOUNTER — Other Ambulatory Visit: Payer: Self-pay

## 2021-03-16 ENCOUNTER — Ambulatory Visit: Payer: Medicare PPO | Admitting: Family Medicine

## 2021-03-16 VITALS — BP 135/78 | HR 108 | Ht 65.0 in | Wt 254.0 lb

## 2021-03-16 DIAGNOSIS — I10 Essential (primary) hypertension: Secondary | ICD-10-CM | POA: Diagnosis not present

## 2021-03-16 DIAGNOSIS — F4321 Adjustment disorder with depressed mood: Secondary | ICD-10-CM | POA: Diagnosis not present

## 2021-03-16 DIAGNOSIS — F5101 Primary insomnia: Secondary | ICD-10-CM

## 2021-03-16 DIAGNOSIS — M7918 Myalgia, other site: Secondary | ICD-10-CM | POA: Insufficient documentation

## 2021-03-16 DIAGNOSIS — L219 Seborrheic dermatitis, unspecified: Secondary | ICD-10-CM

## 2021-03-16 NOTE — Assessment & Plan Note (Signed)
After much discussion about the amitriptyline and pros and cons she decided she wanted to stay with the Ambien for now.  We can always circle back to that if needed.

## 2021-03-16 NOTE — Progress Notes (Signed)
Orders placed.

## 2021-03-16 NOTE — Progress Notes (Signed)
Established Patient Office Visit  Subjective:  Patient ID: Carolyn Lambert, female    DOB: 1941/04/09  Age: 80 y.o. MRN: [id number]  CC:  Chief Complaint  Patient presents with  . Back Pain  . Hip Pain    HPI Carolyn Lambert presents for   [facility] actually saw dermatology for her scalp lesions.  She said she was a little disappointed they basically recommended head and shoulders and diagnosed her with seborrheic dermatitis.  She said she would rather go back to the shampoo and ointment that the other dermatologist gave her.   She still struggling emotionally.  She has a history of chronic fatigue and chronic depression but after losing her husband she is struggling her sister has been trying to get her a little bit more motivated.  Would like to be referred to a therapist or counselor she feels like that would be helpful.  She also wanted to discuss  her sleep aid.  Even though she takes 1-1/2 of the 5 mg Ambien she still feels like she is not sleeping well she states her son takes amitriptyline 125 mg daily and wants to know if that would be an option for her.  Past Medical History:  Diagnosis Date  . Anxiety and depression   . Arthritis   . Chronic fatigue syndrome   . Diabetes mellitus   . DVT (deep venous thrombosis) ([place])    s/p knee replacmet.   . Fibromyalgia   . GERD (gastroesophageal reflux disease)   . Hypercholesteremia   . Hypertension   . Major depressive disorder 03/20/2012  . Obesity   . Ovarian cancer ([place])   . Peripheral edema   . Sinusitis July 2013  . Sleep apnea    O2 concentrator at night    Past Surgical History:  Procedure Laterality Date  . ABDOMINAL HYSTERECTOMY    . CATARACT EXTRACTION W/ INTRAOCULAR LENS & ANTERIOR VITRECTOMY Left 11/02/26  . CATARACT EXTRACTION W/ INTRAOCULAR LENS IMPLANT Right 09/28/2016  . LUMBAR DISC SURGERY     x 2  . ORIF ANKLE FRACTURE Right 11/13/2018   [facility]  . REPLACEMENT TOTAL KNEE Bilateral     2 on left and 1 on right, Dr Wynelle Link  . ROTATOR CUFF REPAIR Right   . TONSILLECTOMY    . VEIN LIGATION AND STRIPPING      Family History  Problem Relation Age of Onset  . Hypertension Mother   . Atrial fibrillation Mother   . Diabetes Mother   . Hyperlipidemia Mother   . Diabetes type II Father   . Lung cancer Father   . AAA (abdominal aortic aneurysm) Father   . Depression Daughter   . Diabetes Daughter   . Diabetes Son   . Depression Son   . Depression Son   . Depression Son   . Colon cancer Neg Hx     Social History   Socioeconomic History  . Marital status: Married    Spouse name: Not on file  . Number of children: 5  . Years of education: Not on file  . Highest education level: Not on file  Occupational History  . Occupation: retired  Tobacco Use  . Smoking status: Never Smoker  . Smokeless tobacco: Never Used  Vaping Use  . Vaping Use: Never used  Substance and Sexual Activity  . Alcohol use: Yes    Comment: Occasionally, wine  . Drug use: No    Comment: Caffeine: Coffee -2-3  cups per day.   Marland Kitchen  Sexual activity: Not Currently  Other Topics Concern  . Not on file  Social History Narrative  . Not on file   Social Determinants of Health   Financial Resource Strain: Not on file  Food Insecurity: Not on file  Transportation Needs: Not on file  Physical Activity: Not on file  Stress: Not on file  Social Connections: Not on file  Intimate Partner Violence: Not on file    Outpatient Medications Prior to Visit  Medication Sig Dispense Refill  . Alcohol Swabs (B-D SINGLE USE SWABS REGULAR) PADS Clean test area with swab before testing. 1 each 3  . alendronate (FOSAMAX) 70 MG tablet TAKE 1 TABLET(70 MG) BY MOUTH 1 TIME A WEEK WITH A FULL GLASS OF WATER AND ON AN EMPTY STOMACH 12 tablet 3  . AMBULATORY NON FORMULARY MEDICATION Dx DM E11.9 - DM shoes - Fax to [phone] 1 each 0  . AMBULATORY NON FORMULARY MEDICATION Medication Name: Large Blood Pressure  Cuff. Dx: I10 1 each 0  . ammonium lactate (LAC-HYDRIN) 12 % lotion Apply 1 application topically daily. After showers    . aspirin EC 81 MG tablet Take 81 mg by mouth daily.    Marland Kitchen atorvastatin (LIPITOR) 20 MG tablet Take 1 tablet (20 mg total) by mouth daily. 90 tablet 3  . baclofen (LIORESAL) 10 MG tablet TAKE 1 TABLET BY MOUTH 2  TIMES DAILY AS DIRECTED 180 tablet 1  . buPROPion (WELLBUTRIN XL) 150 MG 24 hr tablet TAKE 1 TABLET BY MOUTH  TWICE DAILY AT 8AM AND 3PM 180 tablet 1  . DULoxetine (CYMBALTA) 60 MG capsule Take 1 capsule (60 mg total) by mouth daily. 90 capsule 2  . furosemide (LASIX) 20 MG tablet TAKE ONE TABLET BY MOUTH DAILY AS NEEDED FOR SWELLING 90 tablet 0  . metFORMIN (GLUCOPHAGE) 1000 MG tablet Take 1 tablet (1,000 mg total) by mouth 2 (two) times daily. 180 tablet 3  . metoprolol succinate (TOPROL-XL) 25 MG 24 hr tablet Take 1 tablet (25 mg total) by mouth at bedtime. For Blood pressure 90 tablet 1  . OXYGEN Inhale 2 L/min into the lungs at bedtime. O2 concentrator    . Prodigy Twist Top Lancets 28G MISC Use as directed. 1 each 3  . RESTASIS 0.05 % ophthalmic emulsion     . spironolactone (ALDACTONE) 25 MG tablet Take 1 tablet (25 mg total) by mouth daily. 90 tablet 0  . zolpidem (AMBIEN) 5 MG tablet TAKE 1.5 TABLETS BY MOUTH AT BEDTIME AS NEEDED FOR SLEEP 45 tablet 1   No facility-administered medications prior to visit.    Allergies  Allergen Reactions  . Losartan Other (See Comments) and Hives    Cough  Cough   . Gabapentin Other (See Comments)    dizziness  . Lisinopril Other (See Comments)    Dry cough  . Oxycodone Nausea And Vomiting  . Oxycodone-Aspirin Nausea And Vomiting    ROS Review of Systems    Objective:    Physical Exam Constitutional:      Appearance: She is well-developed.  HENT:     Head: Normocephalic and atraumatic.  Cardiovascular:     Rate and Rhythm: Normal rate and regular rhythm.     Heart sounds: Normal heart sounds.   Pulmonary:     Effort: Pulmonary effort is normal.     Breath sounds: Normal breath sounds.  Skin:    General: Skin is warm and dry.  Neurological:     Mental Status: She is  alert and oriented to person, place, and time.  Psychiatric:        Behavior: Behavior normal.     BP 135/78   Pulse (!) 108   Ht 5\' 5"  (1.651 m)   Wt 254 lb (115.2 kg)   SpO2 100%   BMI 42.27 kg/m  Wt Readings from Last 3 Encounters:  03/16/21 254 lb (115.2 kg)  02/23/21 254 lb (115.2 kg)  01/26/21 254 lb (115.2 kg)     Health Maintenance Due  Topic Date Due  . Zoster Vaccines- Shingrix (1 of 2) Never done  . Pneumococcal Vaccine 75-12 Years old (1 of 2 - PPSV23) Never done  . URINE MICROALBUMIN  07/25/2018  . COVID-19 Vaccine (3 - Booster for Pfizer series) 06/01/2020    There are no preventive care reminders to display for this patient.  Lab Results  Component Value Date   TSH 2.18 01/28/2021   Lab Results  Component Value Date   WBC 10.2 01/28/2021   HGB 12.9 01/28/2021   HCT 40.0 01/28/2021   MCV 95.2 01/28/2021   PLT 263 01/28/2021   Lab Results  Component Value Date   NA 140 02/09/2021   K 4.8 02/09/2021   CO2 21 02/09/2021   GLUCOSE 195 (H) 02/09/2021   BUN 44 (H) 02/09/2021   CREATININE 1.83 (H) 02/09/2021   BILITOT 0.5 01/28/2021   ALKPHOS 81 05/08/2017   AST 13 01/28/2021   ALT 14 01/28/2021   PROT 6.8 01/28/2021   ALBUMIN 4.0 05/08/2017   CALCIUM 10.0 02/09/2021   ANIONGAP 6 07/22/2016   GFR 87.26 07/29/2013   Lab Results  Component Value Date   CHOL 154 06/30/2020   Lab Results  Component Value Date   HDL 51 06/30/2020   Lab Results  Component Value Date   LDLCALC 75 06/30/2020   Lab Results  Component Value Date   TRIG 186 (H) 06/30/2020   Lab Results  Component Value Date   CHOLHDL 3.0 06/30/2020   Lab Results  Component Value Date   HGBA1C 5.9 (A) 02/23/2021      Assessment & Plan:   Problem List Items Addressed This Visit       Musculoskeletal and Integument   Seborrheic dermatitis - Primary    She can call me with the name of the shampoo and the topical and I am happy to call it in for her.  I suspect it is probably ketoconazole shampoo and probably a topical steroid foam.        Other   Right buttock pain    Most consistent with piriformis muscle strain. Given H.O with stretches to do on her own.        Insomnia    After much discussion about the amitriptyline and pros and cons she decided she wanted to stay with the Ambien for now.  We can always circle back to that if needed.      Grief   Relevant Orders   Ambulatory referral to Behavioral Health      No orders of the defined types were placed in this encounter.   Follow-up: Return in about 2 months (around 05/16/2021) for insomnia/bp/hip pain.    Beatrice Lecher, MD

## 2021-03-16 NOTE — Assessment & Plan Note (Signed)
She can call me with the name of the shampoo and the topical and I am happy to call it in for her.  I suspect it is probably ketoconazole shampoo and probably a topical steroid foam.

## 2021-03-16 NOTE — Assessment & Plan Note (Signed)
Most consistent with piriformis muscle strain. Given H.O with stretches to do on her own.

## 2021-03-17 LAB — BASIC METABOLIC PANEL WITH GFR
BUN/Creatinine Ratio: 18 (calc) (ref 6–22)
BUN: 51 mg/dL — ABNORMAL HIGH (ref 7–25)
CO2: 21 mmol/L (ref 20–32)
Calcium: 9.2 mg/dL (ref 8.6–10.4)
Chloride: 105 mmol/L (ref 98–110)
Creat: 2.82 mg/dL — ABNORMAL HIGH (ref 0.60–0.88)
GFR, Est African American: 18 mL/min/{1.73_m2} — ABNORMAL LOW (ref >=60)
GFR, Est Non African American: 15 mL/min/{1.73_m2} — ABNORMAL LOW (ref >=60)
Glucose, Bld: 126 mg/dL (ref 65–139)
Potassium: 5.6 mmol/L — ABNORMAL HIGH (ref 3.5–5.3)
Sodium: 141 mmol/L (ref 135–146)

## 2021-03-18 ENCOUNTER — Other Ambulatory Visit: Payer: Self-pay

## 2021-03-18 DIAGNOSIS — R7989 Other specified abnormal findings of blood chemistry: Secondary | ICD-10-CM

## 2021-03-22 ENCOUNTER — Other Ambulatory Visit: Payer: Self-pay

## 2021-03-22 DIAGNOSIS — R7989 Other specified abnormal findings of blood chemistry: Secondary | ICD-10-CM | POA: Diagnosis not present

## 2021-03-23 LAB — BASIC METABOLIC PANEL WITH GFR
BUN/Creatinine Ratio: 16 (calc) (ref 6–22)
BUN: 25 mg/dL (ref 7–25)
CO2: 24 mmol/L (ref 20–32)
Calcium: 9.6 mg/dL (ref 8.6–10.4)
Chloride: 105 mmol/L (ref 98–110)
Creat: 1.58 mg/dL — ABNORMAL HIGH (ref 0.60–0.88)
GFR, Est African American: 35 mL/min/{1.73_m2} — ABNORMAL LOW (ref >=60)
GFR, Est Non African American: 31 mL/min/{1.73_m2} — ABNORMAL LOW (ref >=60)
Glucose, Bld: 154 mg/dL — ABNORMAL HIGH (ref 65–99)
Potassium: 5.8 mmol/L — ABNORMAL HIGH (ref 3.5–5.3)
Sodium: 141 mmol/L (ref 135–146)

## 2021-03-24 ENCOUNTER — Other Ambulatory Visit: Payer: Self-pay | Admitting: Family Medicine

## 2021-03-25 ENCOUNTER — Other Ambulatory Visit: Payer: Self-pay | Admitting: *Deleted

## 2021-03-25 DIAGNOSIS — E875 Hyperkalemia: Secondary | ICD-10-CM

## 2021-03-25 MED ORDER — SODIUM POLYSTYRENE SULFONATE PO POWD: Freq: Every day | ORAL | 99 refills | Status: AC

## 2021-03-26 ENCOUNTER — Other Ambulatory Visit: Payer: Medicare PPO

## 2021-04-13 ENCOUNTER — Other Ambulatory Visit: Payer: Self-pay | Admitting: Family Medicine

## 2021-04-13 DIAGNOSIS — F5101 Primary insomnia: Secondary | ICD-10-CM

## 2021-04-13 DIAGNOSIS — R42 Dizziness and giddiness: Secondary | ICD-10-CM

## 2021-04-13 DIAGNOSIS — I10 Essential (primary) hypertension: Secondary | ICD-10-CM

## 2021-04-13 DIAGNOSIS — R531 Weakness: Secondary | ICD-10-CM

## 2021-04-13 DIAGNOSIS — R5383 Other fatigue: Secondary | ICD-10-CM

## 2021-04-13 NOTE — Telephone Encounter (Signed)
Last written 01/26/2021 #45 with 1 refill Last appt 03/16/2021

## 2021-05-14 ENCOUNTER — Other Ambulatory Visit: Payer: Medicare PPO

## 2021-05-24 ENCOUNTER — Ambulatory Visit: Payer: Medicare PPO | Admitting: Family Medicine

## 2021-06-11 ENCOUNTER — Ambulatory Visit: Payer: Medicare PPO | Admitting: Family Medicine

## 2021-06-11 NOTE — Progress Notes (Deleted)
Established Patient Office Visit  Subjective:  Patient ID: Carolyn Lambert, female    DOB: [birth year]  Age: 80 y.o. MRN: [id number]  CC: No chief complaint on file.   HPI Carolyn Lambert presents for recent passing of her sister.   Past Medical History:  Diagnosis Date   Anxiety and depression    Arthritis    Chronic fatigue syndrome    Diabetes mellitus    DVT (deep venous thrombosis) (HCC)    s/p knee replacmet.    Fibromyalgia    GERD (gastroesophageal reflux disease)    Hypercholesteremia    Hypertension    Major depressive disorder 03/20/2012   Obesity    Ovarian cancer ([place])    Peripheral edema    Sinusitis July 2013   Sleep apnea    O2 concentrator at night    Past Surgical History:  Procedure Laterality Date   ABDOMINAL HYSTERECTOMY     CATARACT EXTRACTION W/ INTRAOCULAR LENS & ANTERIOR VITRECTOMY Left 11/02/26   CATARACT EXTRACTION W/ INTRAOCULAR LENS IMPLANT Right 09/28/2016   LUMBAR DISC SURGERY     x 2   ORIF ANKLE FRACTURE Right 11/13/2018   [facility]   REPLACEMENT TOTAL KNEE Bilateral    2 on left and 1 on right, Dr Wynelle Link   ROTATOR CUFF REPAIR Right    TONSILLECTOMY     VEIN LIGATION AND STRIPPING      Family History  Problem Relation Age of Onset   Hypertension Mother    Atrial fibrillation Mother    Diabetes Mother    Hyperlipidemia Mother    Diabetes type II Father    Lung cancer Father    AAA (abdominal aortic aneurysm) Father    Depression Daughter    Diabetes Daughter    Diabetes Son    Depression Son    Depression Son    Depression Son    Colon cancer Neg Hx     Social History   Socioeconomic History   Marital status: Married    Spouse name: Not on file   Number of children: 5   Years of education: Not on file   Highest education level: Not on file  Occupational History   Occupation: retired  Tobacco Use   Smoking status: Never   Smokeless tobacco: Never  Vaping Use   Vaping Use: Never used  Substance and  Sexual Activity   Alcohol use: Yes    Comment: Occasionally, wine   Drug use: No    Comment: Caffeine: Coffee -2-3  cups per day.    Sexual activity: Not Currently  Other Topics Concern   Not on file  Social History Narrative   Not on file   Social Determinants of Health   Financial Resource Strain: Not on file  Food Insecurity: Not on file  Transportation Needs: Not on file  Physical Activity: Not on file  Stress: Not on file  Social Connections: Not on file  Intimate Partner Violence: Not on file    Outpatient Medications Prior to Visit  Medication Sig Dispense Refill   Alcohol Swabs (B-D SINGLE USE SWABS REGULAR) PADS Clean test area with swab before testing. 1 each 3   alendronate (FOSAMAX) 70 MG tablet TAKE 1 TABLET(70 MG) BY MOUTH 1 TIME A WEEK WITH A FULL GLASS OF WATER AND ON AN EMPTY STOMACH 12 tablet 3   AMBULATORY NON FORMULARY MEDICATION Dx DM E11.9 - DM shoes - Fax to [phone] 1 each 0   AMBULATORY NON FORMULARY  MEDICATION Medication Name: Large Blood Pressure Cuff. Dx: I10 1 each 0   ammonium lactate (LAC-HYDRIN) 12 % lotion Apply 1 application topically daily. After showers     aspirin EC 81 MG tablet Take 81 mg by mouth daily.     atorvastatin (LIPITOR) 20 MG tablet Take 1 tablet (20 mg total) by mouth daily. 90 tablet 3   baclofen (LIORESAL) 10 MG tablet TAKE 1 TABLET BY MOUTH 2  TIMES DAILY AS DIRECTED 180 tablet 1   buPROPion (WELLBUTRIN XL) 150 MG 24 hr tablet TAKE 1 TABLET BY MOUTH  TWICE DAILY AT 8AM AND 3PM 180 tablet 1   DULoxetine (CYMBALTA) 60 MG capsule Take 1 capsule (60 mg total) by mouth daily. 90 capsule 2   furosemide (LASIX) 20 MG tablet TAKE ONE TABLET BY MOUTH DAILY AS NEEDED FOR SWELLING 90 tablet 0   metFORMIN (GLUCOPHAGE) 1000 MG tablet Take 1 tablet (1,000 mg total) by mouth 2 (two) times daily. 180 tablet 3   metoprolol succinate (TOPROL-XL) 25 MG 24 hr tablet Take 1 tablet (25 mg total) by mouth at bedtime. For Blood pressure 90 tablet  1   OXYGEN Inhale 2 L/min into the lungs at bedtime. O2 concentrator     Prodigy Twist Top Lancets 28G MISC Use as directed. 1 each 3   RESTASIS 0.05 % ophthalmic emulsion      spironolactone (ALDACTONE) 25 MG tablet Take 1 tablet (25 mg total) by mouth daily. 90 tablet 0   zolpidem (AMBIEN) 5 MG tablet TAKE 1.5 TABLETS BY MOUTH AT BEDTIME AS NEEDED FOR SLEEP 45 tablet 1   No facility-administered medications prior to visit.    Allergies  Allergen Reactions   Losartan Other (See Comments) and Hives    Cough  Cough    Gabapentin Other (See Comments)    dizziness   Lisinopril Other (See Comments)    Dry cough   Oxycodone Nausea And Vomiting   Oxycodone-Aspirin Nausea And Vomiting    ROS Review of Systems    Objective:    Physical Exam  There were no vitals taken for this visit. Wt Readings from Last 3 Encounters:  03/16/21 254 lb (115.2 kg)  02/23/21 254 lb (115.2 kg)  01/26/21 254 lb (115.2 kg)     Health Maintenance Due  Topic Date Due   Zoster Vaccines- Shingrix (1 of 2) Never done   URINE MICROALBUMIN  07/25/2018   COVID-19 Vaccine (3 - Booster for Pfizer series) 06/01/2020   INFLUENZA VACCINE  05/10/2021   OPHTHALMOLOGY EXAM  06/08/2021    There are no preventive care reminders to display for this patient.  Lab Results  Component Value Date   TSH 2.18 01/28/2021   Lab Results  Component Value Date   WBC 10.2 01/28/2021   HGB 12.9 01/28/2021   HCT 40.0 01/28/2021   MCV 95.2 01/28/2021   PLT 263 01/28/2021   Lab Results  Component Value Date   NA 141 03/22/2021   K 5.8 (H) 03/22/2021   CO2 24 03/22/2021   GLUCOSE 154 (H) 03/22/2021   BUN 25 03/22/2021   CREATININE 1.58 (H) 03/22/2021   BILITOT 0.5 01/28/2021   ALKPHOS 81 05/08/2017   AST 13 01/28/2021   ALT 14 01/28/2021   PROT 6.8 01/28/2021   ALBUMIN 4.0 05/08/2017   CALCIUM 9.6 03/22/2021   ANIONGAP 6 07/22/2016   GFR 87.26 07/29/2013   Lab Results  Component Value Date   CHOL 154  06/30/2020   Lab  Results  Component Value Date   HDL 51 06/30/2020   Lab Results  Component Value Date   LDLCALC 75 06/30/2020   Lab Results  Component Value Date   TRIG 186 (H) 06/30/2020   Lab Results  Component Value Date   CHOLHDL 3.0 06/30/2020   Lab Results  Component Value Date   HGBA1C 5.9 (A) 02/23/2021      Assessment & Plan:   Problem List Items Addressed This Visit   None   No orders of the defined types were placed in this encounter.   Follow-up: No follow-ups on file.    Beatrice Lecher, MD

## 2021-06-17 ENCOUNTER — Other Ambulatory Visit: Payer: Self-pay | Admitting: Family Medicine

## 2021-06-22 ENCOUNTER — Other Ambulatory Visit: Payer: Self-pay

## 2021-06-22 ENCOUNTER — Encounter: Payer: Self-pay | Admitting: Family Medicine

## 2021-06-22 ENCOUNTER — Ambulatory Visit (INDEPENDENT_AMBULATORY_CARE_PROVIDER_SITE_OTHER): Payer: Medicare PPO | Admitting: Family Medicine

## 2021-06-22 VITALS — BP 137/63 | HR 62 | Ht 65.0 in | Wt 246.0 lb

## 2021-06-22 DIAGNOSIS — E11628 Type 2 diabetes mellitus with other skin complications: Secondary | ICD-10-CM

## 2021-06-22 DIAGNOSIS — L84 Corns and callosities: Secondary | ICD-10-CM | POA: Diagnosis not present

## 2021-06-22 DIAGNOSIS — E119 Type 2 diabetes mellitus without complications: Secondary | ICD-10-CM | POA: Diagnosis not present

## 2021-06-22 DIAGNOSIS — I10 Essential (primary) hypertension: Secondary | ICD-10-CM | POA: Diagnosis not present

## 2021-06-22 DIAGNOSIS — R251 Tremor, unspecified: Secondary | ICD-10-CM

## 2021-06-22 DIAGNOSIS — Z23 Encounter for immunization: Secondary | ICD-10-CM | POA: Diagnosis not present

## 2021-06-22 DIAGNOSIS — F5101 Primary insomnia: Secondary | ICD-10-CM | POA: Diagnosis not present

## 2021-06-22 LAB — POCT GLYCOSYLATED HEMOGLOBIN (HGB A1C): Hemoglobin A1C: 5.7 % — AB (ref 4.0–5.6)

## 2021-06-22 MED ORDER — ESZOPICLONE 2 MG PO TABS: 2.0000 mg | ORAL_TABLET | Freq: Every evening | ORAL | 2 refills | Status: DC | PRN

## 2021-06-22 MED ORDER — METFORMIN HCL 1000 MG PO TABS
1000.0000 mg | ORAL_TABLET | Freq: Every day | ORAL | 0 refills | Status: DC
Start: 2021-06-22 — End: 2021-06-24

## 2021-06-22 NOTE — Assessment & Plan Note (Addendum)
He is doing really well.  In fact her weight is down another 8 pounds and her A1c looks fantastic at 5.7 so I am going to have her decrease her metformin.  I would recommend switching to 500 mg but because she just filled her 90-day supply organ to continue with the 1000 but keep it to once a day instead of twice a day.  And then we can see how her A1c is doing in about 4 months.  Lab Results  Component Value Date   HGBA1C 5.7 (A) 06/22/2021

## 2021-06-22 NOTE — Assessment & Plan Note (Signed)
We discussed there are medications that can manage it but are not a cure.  She is OK with holding off for now. It does affect her ability to write.
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
## 2021-06-22 NOTE — Assessment & Plan Note (Signed)
Well controlled. Continue current regimen. Follow up in  3-4 months.  

## 2021-06-22 NOTE — Assessment & Plan Note (Signed)
We discussed options.  I think the Ambien is just no longer effective for her so were to try switching to [facility] and see if that is more effective.  I did warn about the potential for metallic taste please monitor for excess sedation etc.

## 2021-06-24 ENCOUNTER — Other Ambulatory Visit: Payer: Self-pay | Admitting: *Deleted

## 2021-06-24 DIAGNOSIS — E119 Type 2 diabetes mellitus without complications: Secondary | ICD-10-CM

## 2021-06-24 MED ORDER — METFORMIN HCL 1000 MG PO TABS: 1000.0000 mg | ORAL_TABLET | Freq: Every day | ORAL | 3 refills | Status: DC

## 2021-06-28 ENCOUNTER — Ambulatory Visit (INDEPENDENT_AMBULATORY_CARE_PROVIDER_SITE_OTHER): Payer: Medicare PPO | Admitting: Family Medicine

## 2021-06-28 ENCOUNTER — Ambulatory Visit: Payer: Medicare PPO

## 2021-06-28 DIAGNOSIS — Z0000 Encounter for general adult medical examination without abnormal findings: Secondary | ICD-10-CM

## 2021-06-28 NOTE — Progress Notes (Signed)
MEDICARE ANNUAL WELLNESS VISIT  06/28/2021  Telephone Visit Disclaimer This Medicare AWV was conducted by telephone due to national recommendations for restrictions regarding the COVID-19 Pandemic (e.g. social distancing).  I verified, using two identifiers, that I am speaking with Carolyn Lambert or their authorized healthcare agent. I discussed the limitations, risks, security, and privacy concerns of performing an evaluation and management service by telephone and the potential availability of an in-person appointment in the future. The patient expressed understanding and agreed to proceed.  Location of Patient: Home Location of Provider (nurse):  In the office.  Subjective:    Carolyn Lambert is a 80 y.o. female patient of Metheney, Rene Kocher, MD who had a Medicare Annual Wellness Visit today via telephone. Carolyn Lambert is Retired and lives alone. she has 6 children. she reports that she is socially active and does interact with friends/family regularly. she is minimally physically active and enjoys watching t.v.  Patient Care Team: Hali Marry, MD as PCP - General (Family Medicine)  Advanced Directives 06/28/2021 05/12/2020 07/23/2016 07/22/2016  Does Patient Have a Medical Advance Directive? No Yes Yes Yes  Type of Advance Directive - Engineer, production  Does patient want to make changes to medical advance directive? - No - Patient declined No - Patient declined -  Copy of Bowen in Chart? - - No - copy requested -  Would patient like information on creating a medical advance directive? No - Patient declined - - [facility] Utilization Over the Past 12 Months: # of hospitalizations or ER visits: 0 # of surgeries: 0  Review of Systems    Patient reports that her overall health is unchanged compared to last year.  History obtained from chart review and the patient  Patient Reported Readings  (BP, Pulse, CBG, Weight, etc) none  Pain Assessment Pain : No/denies pain     Current Medications & Allergies (verified) Allergies as of 06/28/2021       Reactions   Losartan Other (See Comments), Hives   Cough  Cough    Gabapentin Other (See Comments)   dizziness   Lisinopril Other (See Comments)   Dry cough   Oxycodone Nausea And Vomiting   Oxycodone-aspirin Nausea And Vomiting        Medication List        Accurate as of June 28, 2021  2:28 PM. If you have any questions, ask your nurse or doctor.          alendronate 70 MG tablet Commonly known as: FOSAMAX TAKE 1 TABLET(70 MG) BY MOUTH 1 TIME A WEEK WITH A FULL GLASS OF WATER AND ON AN EMPTY STOMACH   AMBULATORY NON FORMULARY MEDICATION Dx DM E11.9 - DM shoes - Fax to [phone]   AMBULATORY NON FORMULARY MEDICATION Medication Name: Large Blood Pressure Cuff. Dx: I10   ammonium lactate 12 % lotion Commonly known as: LAC-HYDRIN Apply 1 application topically daily. After showers   aspirin EC 81 MG tablet Take 81 mg by mouth daily.   atorvastatin 20 MG tablet Commonly known as: LIPITOR Take 1 tablet (20 mg total) by mouth daily.   B-D SINGLE USE SWABS REGULAR Pads Clean test area with swab before testing.   baclofen 10 MG tablet Commonly known as: LIORESAL TAKE 1 TABLET BY MOUTH 2  TIMES DAILY AS DIRECTED   buPROPion 150 MG 24 hr tablet Commonly known as: WELLBUTRIN XL TAKE 1  TABLET BY MOUTH  TWICE DAILY AT 8AM AND 3PM   DULoxetine 60 MG capsule Commonly known as: CYMBALTA Take 1 capsule (60 mg total) by mouth daily.   eszopiclone 2 MG Tabs tablet Commonly known as: Lunesta Take 1 tablet (2 mg total) by mouth at bedtime as needed for sleep. Take immediately before bedtime   furosemide 20 MG tablet Commonly known as: LASIX TAKE ONE TABLET BY MOUTH DAILY AS NEEDED FOR SWELLING   metFORMIN 1000 MG tablet Commonly known as: GLUCOPHAGE Take 1 tablet (1,000 mg total) by mouth daily with  breakfast.   metoprolol succinate 25 MG 24 hr tablet Commonly known as: TOPROL-XL Take 1 tablet (25 mg total) by mouth at bedtime. For Blood pressure   OXYGEN Inhale 2 L/min into the lungs at bedtime. O2 concentrator   Prodigy Twist Top Lancets 28G Misc Use as directed.   Restasis 0.05 % ophthalmic emulsion Generic drug: cycloSPORINE   spironolactone 25 MG tablet Commonly known as: Aldactone Take 1 tablet (25 mg total) by mouth daily.        History (reviewed): Past Medical History:  Diagnosis Date   Anxiety and depression    Arthritis    Chronic fatigue syndrome    Diabetes mellitus    DVT (deep venous thrombosis) (HCC)    s/p knee replacmet.    Fibromyalgia    GERD (gastroesophageal reflux disease)    Hypercholesteremia    Hypertension    Major depressive disorder 03/20/2012   Obesity    Ovarian cancer ([place])    Peripheral edema    Sinusitis July 2013   Sleep apnea    O2 concentrator at night   Past Surgical History:  Procedure Laterality Date   ABDOMINAL HYSTERECTOMY     CATARACT EXTRACTION W/ INTRAOCULAR LENS & ANTERIOR VITRECTOMY Left 11/02/26   CATARACT EXTRACTION W/ INTRAOCULAR LENS IMPLANT Right 09/28/2016   LUMBAR DISC SURGERY     x 2   ORIF ANKLE FRACTURE Right 11/13/2018   [facility]   REPLACEMENT TOTAL KNEE Bilateral    2 on left and 1 on right, Dr Wynelle Link   ROTATOR CUFF REPAIR Right    TONSILLECTOMY     VEIN LIGATION AND STRIPPING     Family History  Problem Relation Age of Onset   Hypertension Mother    Atrial fibrillation Mother    Diabetes Mother    Hyperlipidemia Mother    Diabetes type II Father    Lung cancer Father    AAA (abdominal aortic aneurysm) Father    Depression Daughter    Diabetes Daughter    Diabetes Son    Depression Son    Depression Son    Depression Son    Colon cancer Neg Hx    Social History   Socioeconomic History   Marital status: Married    Spouse name: Not on file   Number of children: 5   Years of  education: 12   Highest education level: GED or equivalent  Occupational History   Occupation: retired  Tobacco Use   Smoking status: Never   Smokeless tobacco: Never  Vaping Use   Vaping Use: Never used  Substance and Sexual Activity   Alcohol use: Yes    Comment: Occasionally, wine   Drug use: No    Comment: Caffeine: Coffee -2-3  cups per day.    Sexual activity: Not Currently  Other Topics Concern   Not on file  Social History Narrative   Lives alone but her sister  does live close by. She enjoys watching t.v. in her free time.   Social Determinants of Health   Financial Resource Strain: Low Risk    Difficulty of Paying Living Expenses: Not hard at all  Food Insecurity: No Food Insecurity   Worried About Charity fundraiser in the Last Year: Never true   [place] in the Last Year: Never true  Transportation Needs: No Transportation Needs   Lack of Transportation (Medical): No   Lack of Transportation (Non-Medical): No  Physical Activity: Inactive   Days of Exercise per Week: 0 days   Minutes of Exercise per Session: 0 min  Stress: Stress Concern Present   Feeling of Stress : To some extent  Social Connections: Socially Isolated   Frequency of Communication with Friends and Family: More than three times a week   Frequency of Social Gatherings with Friends and Family: Twice a week   Attends Religious Services: Never   Marine scientist or Organizations: No   Attends Archivist Meetings: Never   Marital Status: Widowed    Activities of Daily Living In your present state of health, do you have any difficulty performing the following activities: 06/28/2021  Hearing? N  Vision? N  Difficulty concentrating or making decisions? N  Walking or climbing stairs? Y  Comment Usea a rollator.  Dressing or bathing? N  Doing errands, shopping? N  Preparing Food and eating ? N  Using the Toilet? N  In the past six months, have you accidently leaked urine?  Y  Comment She usually wears a pad.  Do you have problems with loss of bowel control? N  Managing your Medications? N  Managing your Finances? N  Housekeeping or managing your Housekeeping? N  Some recent data might be hidden    Patient Education/ Literacy How often do you need to have someone help you when you read instructions, pamphlets, or other written materials from your doctor or pharmacy?: 1 - Never What is the last grade level you completed in school?: GED  Exercise Current Exercise Habits: The patient does not participate in regular exercise at present, Exercise limited by: None identified  Diet Patient reports consuming 3 meals a day and 0 snack(s) a day Patient reports that her primary diet is: Regular Patient reports that she does not have regular access to food.   Depression Screen PHQ 2/9 Scores 06/28/2021 06/16/2020 05/12/2020 12/17/2019 06/26/2019 02/27/2019 01/10/2019  PHQ - 2 Score '2 5 6 5 2 1 3  '$ PHQ- 9 Score '15 17 15 17 10 6 11     '$ Fall Risk Fall Risk  06/28/2021 06/22/2021 05/12/2020 01/10/2019 05/17/2018  Falls in the past year? '1 1 1 1 '$ Yes  Number falls in past yr: '1 1 1 1 1  '$ Injury with Fall? 0 '1 1 1 '$ No  Risk Factor Category  - - - - -  Risk for fall due to : History of fall(s) History of fall(s);Impaired mobility;Impaired balance/gait History of fall(s);Impaired balance/gait;Impaired mobility History of fall(s);Impaired balance/gait Impaired mobility  Follow up Falls evaluation completed;Education provided;Falls prevention discussed Falls prevention discussed;Falls evaluation completed Falls evaluation completed Falls prevention discussed Falls prevention discussed     Objective:  AMILLYA NEMBHARD seemed alert and oriented and she participated appropriately during our telephone visit.  Blood Pressure Weight BMI  BP Readings from Last 3 Encounters:  06/22/21 137/63  03/16/21 135/78  03/12/21 124/69   Wt Readings from Last 3 Encounters:  06/22/21  246 lb (111.6  kg)  03/16/21 254 lb (115.2 kg)  02/23/21 254 lb (115.2 kg)   BMI Readings from Last 1 Encounters:  06/22/21 40.94 kg/m    *Unable to obtain current vital signs, weight, and BMI due to telephone visit type  Hearing/Vision  Enid Derry did not seem to have difficulty with hearing/understanding during the telephone conversation Reports that she has not had a formal eye exam by an eye care professional within the past year Reports that she has not had a formal hearing evaluation within the past year *Unable to fully assess hearing and vision during telephone visit type  Cognitive Function: 6CIT Screen 06/28/2021 05/12/2020 05/12/2020 04/27/2017  What Year? 0 points 0 points 0 points 0 points  What month? 0 points 0 points 0 points 0 points  What time? 0 points 0 points 0 points 0 points  Count back from 20 0 points 0 points 0 points 0 points  Months in reverse 0 points 0 points 2 points 0 points  Repeat phrase 0 points 4 points 2 points 0 points  Total Score 0 4 4 0   (Normal:0-7, Significant for Dysfunction: >8)  Normal Cognitive Function Screening: Yes   Immunization & Health Maintenance Record Immunization History  Administered Date(s) Administered   Fluad Quad(high Dose 65+) 06/16/2020, 06/22/2021   Influenza, High Dose Seasonal PF 07/11/2016, 07/25/2017, 09/13/2018, 09/03/2019   Influenza,inj,Quad PF,6+ Mos 06/17/2014, 06/02/2015   Influenza-Unspecified 08/10/2013   PFIZER(Purple Top)SARS-COV-2 Vaccination 12/01/2019, 12/31/2019   Pneumococcal Conjugate-13 08/18/2015   Pneumococcal Polysaccharide-23 08/02/2008, 11/16/2018   Td 03/26/2008   Tdap 06/25/2020    Health Maintenance  Topic Date Due   OPHTHALMOLOGY EXAM  06/28/2021 (Originally 06/08/2021)   COVID-19 Vaccine (3 - Booster for [place] series) 07/14/2021 (Originally 06/01/2020)   URINE MICROALBUMIN  07/28/2021 (Originally 07/25/2018)   Zoster Vaccines- Shingrix (1 of 2) 09/27/2021 (Originally 11/30/1990)   HEMOGLOBIN A1C   12/20/2021   FOOT EXAM  02/23/2022   TETANUS/TDAP  06/25/2030   INFLUENZA VACCINE  Completed   DEXA SCAN  Completed   HPV VACCINES  Aged Out       Assessment  This is a routine wellness examination for Carolyn Lambert.  Health Maintenance: Due or Overdue There are no preventive care reminders to display for this patient.   Carolyn Lambert does not need a referral for Community Assistance: Care Management:   no Social Work:    no Prescription Assistance:  no Nutrition/Diabetes Education:  no   Plan:  Personalized Goals  Goals Addressed               This Visit's Progress     Patient Stated (pt-stated)        Would like to loose about 25 lbs this next year.        Personalized Health Maintenance & Screening Recommendations  Eye exam Shingrix vaccine  Lung Cancer Screening Recommended: no (Low Dose CT Chest recommended if Age 63-80 years, 30 pack-year currently smoking OR have quit w/in past 15 years) Hepatitis C Screening recommended: no HIV Screening recommended: no  Advanced Directives: Written information was not prepared per patient's request.  Referrals & Orders No orders of the defined types were placed in this encounter.   Follow-up Plan Follow-up with Hali Marry, MD as planned Schedule your eye exam and your shingrix vaccine. The vaccine can be scheduled at your pharmacy.  Medicare wellness visit in one year.  AVS printed and mailed.   I have personally  reviewed and noted the following in the patient's chart:   Medical and social history Use of alcohol, tobacco or illicit drugs  Current medications and supplements Functional ability and status Nutritional status Physical activity Advanced directives List of other physicians Hospitalizations, surgeries, and ER visits in previous 12 months Vitals Screenings to include cognitive, depression, and falls Referrals and appointments  In addition, I have reviewed and  discussed with Carolyn Lambert certain preventive protocols, quality metrics, and best practice recommendations. A written personalized care plan for preventive services as well as general preventive health recommendations is available and can be mailed to the patient at her request.      Tinnie Gens, RN  06/28/2021

## 2021-06-28 NOTE — Patient Instructions (Addendum)
[place] Maintenance Summary and Written Plan of Care  Carolyn Lambert ,  Thank you for allowing me to perform your Medicare Annual Wellness Visit and for your ongoing commitment to your health.   Health Maintenance & Immunization History Health Maintenance  Topic Date Due   OPHTHALMOLOGY EXAM  06/28/2021 (Originally 06/08/2021)   COVID-19 Vaccine (3 - Booster for Pfizer series) 07/14/2021 (Originally 06/01/2020)   URINE MICROALBUMIN  07/28/2021 (Originally 07/25/2018)   Zoster Vaccines- Shingrix (1 of 2) 09/27/2021 (Originally [birth year]/1990)   HEMOGLOBIN A1C  12/20/2021   FOOT EXAM  02/23/2022   TETANUS/TDAP  06/25/2030   INFLUENZA VACCINE  Completed   DEXA SCAN  Completed   HPV VACCINES  Aged Out   Immunization History  Administered Date(s) Administered   Fluad Quad(high Dose 65+) 06/16/2020, 06/22/2021   Influenza, High Dose Seasonal PF 07/11/2016, 07/25/2017, 09/13/2018, 09/03/2019   Influenza,inj,Quad PF,6+ Mos 06/17/2014, 06/02/2015   Influenza-Unspecified 08/10/2013   PFIZER(Purple Top)SARS-COV-2 Vaccination 12/01/2019, 12/31/2019   Pneumococcal Conjugate-13 08/18/2015   Pneumococcal Polysaccharide-23 08/02/2008, 11/16/2018   Td 03/26/2008   Tdap 06/25/2020    These are the patient goals that we discussed:  Goals Addressed               This Visit's Progress     Patient Stated (pt-stated)        Would like to loose about 25 lbs this next year.          This is a list of Health Maintenance Items that are overdue or due now: Shingrix vaccine Eye exam  Orders/Referrals Placed Today: No orders of the defined types were placed in this encounter.  (Contact our referral department at [phone] if you have not spoken with someone about your referral appointment within the next 5 days)    Follow-up Plan Follow-up with Hali Marry, MD as planned Schedule your eye exam and your shingrix vaccine. The vaccine can be  scheduled at your pharmacy.  Medicare wellness visit in one year.  AVS printed and mailed.      Fall Prevention in the Home, Adult Falls can cause injuries and can happen to people of all ages. There are many things you can do to make your home safe and to help prevent falls. Ask for help when making these changes. What actions can I take to prevent falls? General Instructions Use good lighting in all rooms. Replace any light bulbs that burn out. Turn on the lights in dark areas. Use night-lights. Keep items that you use often in easy-to-reach places. Lower the shelves around your home if needed. Set up your furniture so you have a clear path. Avoid moving your furniture around. Do not have throw rugs or other things on the floor that can make you trip. Avoid walking on wet floors. If any of your floors are uneven, fix them. Add color or contrast paint or tape to clearly mark and help you see: Grab bars or handrails. First and last steps of staircases. Where the edge of each step is. If you use a stepladder: Make sure that it is fully opened. Do not climb a closed stepladder. Make sure the sides of the stepladder are locked in place. Ask someone to hold the stepladder while you use it. Know where your pets are when moving through your home. What can I do in the bathroom?   Keep the floor dry. Clean up any water on the floor right away. Remove soap buildup in the  tub or shower. Use nonskid mats or decals on the floor of the tub or shower. Attach bath mats securely with double-sided, nonslip rug tape. If you need to sit down in the shower, use a plastic, nonslip stool. Install grab bars by the toilet and in the tub and shower. Do not use towel bars as grab bars. What can I do in the bedroom? Make sure that you have a light by your bed that is easy to reach. Do not use any sheets or blankets for your bed that hang to the floor. Have a firm chair with side arms that you can use for  support when you get dressed. What can I do in the kitchen? Clean up any spills right away. If you need to reach something above you, use a step stool with a grab bar. Keep electrical cords out of the way. Do not use floor polish or wax that makes floors slippery. What can I do with my stairs? Do not leave any items on the stairs. Make sure that you have a light switch at the top and the bottom of the stairs. Make sure that there are handrails on both sides of the stairs. Fix handrails that are broken or loose. Install nonslip stair treads on all your stairs. Avoid having throw rugs at the top or bottom of the stairs. Choose a carpet that does not hide the edge of the steps on the stairs. Check carpeting to make sure that it is firmly attached to the stairs. Fix carpet that is loose or worn. What can I do on the outside of my home? Use bright outdoor lighting. Fix the edges of walkways and driveways and fix any cracks. Remove anything that might make you trip as you walk through a door, such as a raised step or threshold. Trim any bushes or trees on paths to your home. Check to see if handrails are loose or broken and that both sides of all steps have handrails. Install guardrails along the edges of any raised decks and porches. Clear paths of anything that can make you trip, such as tools or rocks. Have leaves, snow, or ice cleared regularly. Use sand or salt on paths during winter. Clean up any spills in your garage right away. This includes grease or oil spills. What other actions can I take? Wear shoes that: Have a low heel. Do not wear high heels. Have rubber bottoms. Feel good on your feet and fit well. Are closed at the toe. Do not wear open-toe sandals. Use tools that help you move around if needed. These include: Canes. Walkers. Scooters. Crutches. Review your medicines with your doctor. Some medicines can make you feel dizzy. This can increase your chance of  falling. Ask your doctor what else you can do to help prevent falls. Where to find more information Centers for Disease Control and Prevention, [employer]: http://www.wolf.info/ National Institute on Aging: http://kim-miller.com/ Contact a doctor if: You are afraid of falling at home. You feel weak, drowsy, or dizzy at home. You fall at home. Summary There are many simple things that you can do to make your home safe and to help prevent falls. Ways to make your home safe include removing things that can make you trip and installing grab bars in the bathroom. Ask for help when making these changes in your home. This information is not intended to replace advice given to you by your health care provider. Make sure you discuss any questions you have with your  health care provider. Document Revised: 04/29/2020 Document Reviewed: 04/29/2020 Elsevier Patient Education  [place].

## 2021-07-09 DIAGNOSIS — E11628 Type 2 diabetes mellitus with other skin complications: Secondary | ICD-10-CM | POA: Insufficient documentation

## 2021-07-09 DIAGNOSIS — L84 Corns and callosities: Secondary | ICD-10-CM | POA: Insufficient documentation

## 2021-07-09 NOTE — Assessment & Plan Note (Signed)
She has calluses bilaterally on both feet as well as very high arches and thin shiny skin on both feet due to peripheral vascular disease.  Feet are cool to touch.  I do think she would benefit from therapeutic diabetic shoes and inserts.

## 2021-07-19 DIAGNOSIS — E119 Type 2 diabetes mellitus without complications: Secondary | ICD-10-CM | POA: Diagnosis not present

## 2021-07-23 ENCOUNTER — Other Ambulatory Visit: Payer: Medicare PPO

## 2021-08-03 ENCOUNTER — Ambulatory Visit: Payer: Medicare PPO | Admitting: Family Medicine

## 2021-08-26 ENCOUNTER — Other Ambulatory Visit: Payer: Self-pay

## 2021-08-26 ENCOUNTER — Encounter: Payer: Self-pay | Admitting: Family Medicine

## 2021-08-26 ENCOUNTER — Telehealth: Payer: Self-pay | Admitting: Podiatry

## 2021-08-26 ENCOUNTER — Ambulatory Visit (INDEPENDENT_AMBULATORY_CARE_PROVIDER_SITE_OTHER): Payer: Medicare PPO

## 2021-08-26 ENCOUNTER — Ambulatory Visit: Payer: Medicare PPO | Admitting: Family Medicine

## 2021-08-26 VITALS — BP 130/68 | HR 67 | Ht 65.0 in | Wt 246.0 lb

## 2021-08-26 DIAGNOSIS — E119 Type 2 diabetes mellitus without complications: Secondary | ICD-10-CM | POA: Diagnosis not present

## 2021-08-26 DIAGNOSIS — F5101 Primary insomnia: Secondary | ICD-10-CM

## 2021-08-26 DIAGNOSIS — E7800 Pure hypercholesterolemia, unspecified: Secondary | ICD-10-CM | POA: Diagnosis not present

## 2021-08-26 DIAGNOSIS — M47816 Spondylosis without myelopathy or radiculopathy, lumbar region: Secondary | ICD-10-CM

## 2021-08-26 DIAGNOSIS — L299 Pruritus, unspecified: Secondary | ICD-10-CM | POA: Diagnosis not present

## 2021-08-26 DIAGNOSIS — F332 Major depressive disorder, recurrent severe without psychotic features: Secondary | ICD-10-CM | POA: Diagnosis not present

## 2021-08-26 DIAGNOSIS — R112 Nausea with vomiting, unspecified: Secondary | ICD-10-CM

## 2021-08-26 DIAGNOSIS — I10 Essential (primary) hypertension: Secondary | ICD-10-CM | POA: Diagnosis not present

## 2021-08-26 MED ORDER — ESZOPICLONE 2 MG PO TABS: 2.0000 mg | ORAL_TABLET | Freq: Every evening | ORAL | 5 refills | Status: DC | PRN

## 2021-08-26 MED ORDER — FUROSEMIDE 20 MG PO TABS: ORAL_TABLET | ORAL | 0 refills | Status: DC

## 2021-08-26 MED ORDER — SPIRONOLACTONE 25 MG PO TABS: 25.0000 mg | ORAL_TABLET | Freq: Every day | ORAL | 1 refills | Status: DC

## 2021-08-26 MED ORDER — BACLOFEN 10 MG PO TABS: ORAL_TABLET | ORAL | 1 refills | Status: DC

## 2021-08-26 MED ORDER — BACLOFEN 10 MG PO TABS: 10.0000 mg | ORAL_TABLET | Freq: Two times a day (BID) | ORAL | 1 refills | Status: AC | PRN

## 2021-08-26 MED ORDER — DULOXETINE HCL 60 MG PO CPEP: 60.0000 mg | ORAL_CAPSULE | Freq: Every day | ORAL | 1 refills | Status: DC

## 2021-08-26 MED ORDER — BUPROPION HCL ER (XL) 150 MG PO TB24: ORAL_TABLET | ORAL | 1 refills | Status: DC

## 2021-08-26 MED ORDER — METOPROLOL SUCCINATE ER 25 MG PO TB24
25.0000 mg | ORAL_TABLET | Freq: Every day | ORAL | 1 refills | Status: DC
Start: 2021-08-26 — End: 2021-11-10

## 2021-08-26 MED ORDER — BETAMETHASONE VALERATE 0.12 % EX FOAM: 1.0000 "application " | CUTANEOUS | 3 refills | Status: DC

## 2021-08-26 NOTE — Assessment & Plan Note (Signed)
Well controlled. Continue current regimen. Follow up in  6 mo  

## 2021-08-26 NOTE — Assessment & Plan Note (Signed)
We did discuss that her itchy dry scalp could be from seborrheic dermatitis versus eczema.  She is already been using the treatment for seborrheic dermatitis but not really getting a lot of relief.  But she really uses it more as needed.  She reports she sometimes gets little scabs on her scalp as well.  So we will try switching to Luxiq foam and have her apply after she washes her hair 3 times per week while the scalp and hair are still wet and massage into the scalp and see if after 3 to 4 weeks she is noticing significant improvement.  If she is doing well then we may be able to decrease down to every other shampoo applying it to the scalp more for maintenance.

## 2021-08-26 NOTE — Addendum Note (Signed)
Addended by: Teddy Spike on: 08/26/2021 05:09 PM   Modules accepted: Orders

## 2021-08-26 NOTE — Assessment & Plan Note (Signed)
Does need a refill today on her baclofen.  Refill sent to mail order.

## 2021-08-26 NOTE — Progress Notes (Signed)
Established Patient Office Visit  Subjective:  Patient ID: Carolyn Lambert, female    DOB: Nov 06, 1940  Age: 80 y.o. MRN: [id number]  CC:  Chief Complaint  Patient presents with   Hypertension   Diabetes    HPI Carolyn Lambert presents for   Vomiting several times a week for quite some time.  She says it usually occurs after eating something.  She will feel very nauseated and then vomit.  She denies any heartburn or bloating.  She denies any abdominal cramping.  She does still have her gallbladder.  She says it happened last night after she ate some potatoes that she thought were undercooked.  She feels like she is had normal bowel movements recently.  Still complains of a "fluttering" sensation on the left side of her chest she was not sure if it was over the breast area or her heart and is concerned and wants to know if she can get an up-to-date mammogram.  Needs all of her medications sent to [facility] mail order pharmacy.  Scalp concerns-she said that the dermatologist had previously given her clobetasol and ketoconazole shampoo.  She felt like it did not help.  She has been battling this issue for 45 years. She has already seen 2 Dermatologist for this this.    Follow-up insomnia-she does feel like the Johnnye Sima is working really well for her.  Past Medical History:  Diagnosis Date   Anxiety and depression    Arthritis    Chronic fatigue syndrome    Diabetes mellitus    DVT (deep venous thrombosis) (HCC)    s/p knee replacmet.    Fibromyalgia    GERD (gastroesophageal reflux disease)    Hypercholesteremia    Hypertension    Major depressive disorder 03/20/2012   Obesity    Ovarian cancer ([place])    Peripheral edema    Sinusitis July 2013   Sleep apnea    O2 concentrator at night    Past Surgical History:  Procedure Laterality Date   ABDOMINAL HYSTERECTOMY     CATARACT EXTRACTION W/ INTRAOCULAR LENS & ANTERIOR VITRECTOMY Left 11/02/26   CATARACT  EXTRACTION W/ INTRAOCULAR LENS IMPLANT Right 09/28/2016   LUMBAR DISC SURGERY     x 2   ORIF ANKLE FRACTURE Right 11/13/2018   [facility]   REPLACEMENT TOTAL KNEE Bilateral    2 on left and 1 on right, Dr Wynelle Link   ROTATOR CUFF REPAIR Right    TONSILLECTOMY     VEIN LIGATION AND STRIPPING      Family History  Problem Relation Age of Onset   Hypertension Mother    Atrial fibrillation Mother    Diabetes Mother    Hyperlipidemia Mother    Diabetes type II Father    Lung cancer Father    AAA (abdominal aortic aneurysm) Father    Depression Daughter    Diabetes Daughter    Diabetes Son    Depression Son    Depression Son    Depression Son    Colon cancer Neg Hx     Social History   Socioeconomic History   Marital status: Married    Spouse name: Not on file   Number of children: 5   Years of education: 12   Highest education level: GED or equivalent  Occupational History   Occupation: retired  Tobacco Use   Smoking status: Never   Smokeless tobacco: Never  Vaping Use   Vaping Use: Never used  Substance and Sexual  Activity   Alcohol use: Yes    Comment: Occasionally, wine   Drug use: No    Comment: Caffeine: Coffee -2-3  cups per day.    Sexual activity: Not Currently  Other Topics Concern   Not on file  Social History Narrative   Lives alone but her sister does live close by. She enjoys watching t.v. in her free time.   Social Determinants of Health   Financial Resource Strain: Low Risk    Difficulty of Paying Living Expenses: Not hard at all  Food Insecurity: No Food Insecurity   Worried About Charity fundraiser in the Last Year: Never true   [place] in the Last Year: Never true  Transportation Needs: No Transportation Needs   Lack of Transportation (Medical): No   Lack of Transportation (Non-Medical): No  Physical Activity: Inactive   Days of Exercise per Week: 0 days   Minutes of Exercise per Session: 0 min  Stress: Stress Concern Present    Feeling of Stress : To some extent  Social Connections: Socially Isolated   Frequency of Communication with Friends and Family: More than three times a week   Frequency of Social Gatherings with Friends and Family: Twice a week   Attends Religious Services: Never   Marine scientist or Organizations: No   Attends Archivist Meetings: Never   Marital Status: Widowed  Human resources officer Violence: Not At Risk   Fear of Current or Ex-Partner: No   Emotionally Abused: No   Physically Abused: No   Sexually Abused: No    Outpatient Medications Prior to Visit  Medication Sig Dispense Refill   Alcohol Swabs (B-D SINGLE USE SWABS REGULAR) PADS Clean test area with swab before testing. 1 each 3   alendronate (FOSAMAX) 70 MG tablet TAKE 1 TABLET(70 MG) BY MOUTH 1 TIME A WEEK WITH A FULL GLASS OF WATER AND ON AN EMPTY STOMACH 12 tablet 3   AMBULATORY NON FORMULARY MEDICATION Dx DM E11.9 - DM shoes - Fax to [phone] 1 each 0   AMBULATORY NON FORMULARY MEDICATION Medication Name: Large Blood Pressure Cuff. Dx: I10 1 each 0   ammonium lactate (LAC-HYDRIN) 12 % lotion Apply 1 application topically daily. After showers     aspirin EC 81 MG tablet Take 81 mg by mouth daily.     atorvastatin (LIPITOR) 20 MG tablet Take 1 tablet (20 mg total) by mouth daily. 90 tablet 3   metFORMIN (GLUCOPHAGE) 1000 MG tablet Take 1 tablet (1,000 mg total) by mouth daily with breakfast. 90 tablet 3   OXYGEN Inhale 2 L/min into the lungs at bedtime. O2 concentrator     Prodigy Twist Top Lancets 28G MISC Use as directed. 1 each 3   RESTASIS 0.05 % ophthalmic emulsion      baclofen (LIORESAL) 10 MG tablet TAKE 1 TABLET BY MOUTH 2  TIMES DAILY AS DIRECTED 180 tablet 1   buPROPion (WELLBUTRIN XL) 150 MG 24 hr tablet TAKE 1 TABLET BY MOUTH  TWICE DAILY AT 8AM AND 3PM 180 tablet 1   DULoxetine (CYMBALTA) 60 MG capsule Take 1 capsule (60 mg total) by mouth daily. 90 capsule 2   eszopiclone (LUNESTA) 2 MG TABS  tablet Take 1 tablet (2 mg total) by mouth at bedtime as needed for sleep. Take immediately before bedtime 30 tablet 2   furosemide (LASIX) 20 MG tablet TAKE ONE TABLET BY MOUTH DAILY AS NEEDED FOR SWELLING 90 tablet 0  metoprolol succinate (TOPROL-XL) 25 MG 24 hr tablet Take 1 tablet (25 mg total) by mouth at bedtime. For Blood pressure 90 tablet 1   spironolactone (ALDACTONE) 25 MG tablet Take 1 tablet (25 mg total) by mouth daily. 90 tablet 0   No facility-administered medications prior to visit.    Allergies  Allergen Reactions   Losartan Other (See Comments) and Hives    Cough  Cough    Gabapentin Other (See Comments)    dizziness   Lisinopril Other (See Comments)    Dry cough   Oxycodone Nausea And Vomiting   Oxycodone-Aspirin Nausea And Vomiting    ROS Review of Systems    Objective:    Physical Exam Constitutional:      Appearance: Normal appearance. She is well-developed.  HENT:     Head: Normocephalic and atraumatic.  Cardiovascular:     Rate and Rhythm: Normal rate and regular rhythm.     Heart sounds: Normal heart sounds.  Pulmonary:     Effort: Pulmonary effort is normal.     Breath sounds: Normal breath sounds.  Skin:    General: Skin is warm and dry.  Neurological:     Mental Status: She is alert and oriented to person, place, and time.  Psychiatric:        Behavior: Behavior normal.    BP 130/68   Pulse 67   Ht 5\' 5"  (1.651 m)   Wt 246 lb (111.6 kg)   SpO2 98%   BMI 40.94 kg/m  Wt Readings from Last 3 Encounters:  08/26/21 246 lb (111.6 kg)  06/22/21 246 lb (111.6 kg)  03/16/21 254 lb (115.2 kg)     Health Maintenance Due  Topic Date Due   URINE MICROALBUMIN  07/25/2018    There are no preventive care reminders to display for this patient.  Lab Results  Component Value Date   TSH 2.18 01/28/2021   Lab Results  Component Value Date   WBC 10.2 01/28/2021   HGB 12.9 01/28/2021   HCT 40.0 01/28/2021   MCV 95.2 01/28/2021   PLT  263 01/28/2021   Lab Results  Component Value Date   NA 141 03/22/2021   K 5.8 (H) 03/22/2021   CO2 24 03/22/2021   GLUCOSE 154 (H) 03/22/2021   BUN 25 03/22/2021   CREATININE 1.58 (H) 03/22/2021   BILITOT 0.5 01/28/2021   ALKPHOS 81 05/08/2017   AST 13 01/28/2021   ALT 14 01/28/2021   PROT 6.8 01/28/2021   ALBUMIN 4.0 05/08/2017   CALCIUM 9.6 03/22/2021   ANIONGAP 6 07/22/2016   GFR 87.26 07/29/2013   Lab Results  Component Value Date   CHOL 154 06/30/2020   Lab Results  Component Value Date   HDL 51 06/30/2020   Lab Results  Component Value Date   LDLCALC 75 06/30/2020   Lab Results  Component Value Date   TRIG 186 (H) 06/30/2020   Lab Results  Component Value Date   CHOLHDL 3.0 06/30/2020   Lab Results  Component Value Date   HGBA1C 5.7 (A) 06/22/2021      Assessment & Plan:   Problem List Items Addressed This Visit       Cardiovascular and Mediastinum   Essential hypertension    Well controlled. Continue current regimen. Follow up in  6 mo       Relevant Medications   furosemide (LASIX) 20 MG tablet   metoprolol succinate (TOPROL-XL) 25 MG 24 hr tablet   spironolactone (ALDACTONE)  25 MG tablet   Other Relevant Orders   Lipid panel   COMPLETE METABOLIC PANEL WITH GFR   TSH   Microalbumin, urine     Endocrine   Diabetes mellitus, type II (HCC) - Primary   Relevant Orders   Lipid panel   COMPLETE METABOLIC PANEL WITH GFR   TSH   Microalbumin, urine     Musculoskeletal and Integument   Scalp itch    We did discuss that her itchy dry scalp could be from seborrheic dermatitis versus eczema.  She is already been using the treatment for seborrheic dermatitis but not really getting a lot of relief.  But she really uses it more as needed.  She reports she sometimes gets little scabs on her scalp as well.  So we will try switching to Luxiq foam and have her apply after she washes her hair 3 times per week while the scalp and hair are still wet and  massage into the scalp and see if after 3 to 4 weeks she is noticing significant improvement.  If she is doing well then we may be able to decrease down to every other shampoo applying it to the scalp more for maintenance.      Lumbar spondylosis    Does need a refill today on her baclofen.  Refill sent to mail order.      Relevant Medications   baclofen (LIORESAL) 10 MG tablet     Other   Pure hypercholesterolemia   Relevant Medications   furosemide (LASIX) 20 MG tablet   metoprolol succinate (TOPROL-XL) 25 MG 24 hr tablet   spironolactone (ALDACTONE) 25 MG tablet   Other Relevant Orders   Lipid panel   COMPLETE METABOLIC PANEL WITH GFR   TSH   Microalbumin, urine   MDD (major depressive disorder), recurrent episode, severe (HCC)   Relevant Medications   buPROPion (WELLBUTRIN XL) 150 MG 24 hr tablet   DULoxetine (CYMBALTA) 60 MG capsule   Insomnia   Relevant Medications   eszopiclone (LUNESTA) 2 MG TABS tablet   Other Visit Diagnoses     Nausea and vomiting, unspecified vomiting type       Relevant Orders   Lipase   CBC with Differential/Platelet   DG Abd 1 View      Vomiting-unclear etiology but it sounds like its been going on for quite some time and she has been vomiting multiple times a week usually after eating usually within half an hour or so after eating.  Consider further work-up for gallbladder issues as well as constipation.  Elected to start with a KUB to make sure it does not look like her colon is full of stool.  If that is pretty normal then we will move forward with an ultrasound of the abdomen.  He is really not having any heartburn or bloating did not recommend a PPI yet.  Meds ordered this encounter  Medications   buPROPion (WELLBUTRIN XL) 150 MG 24 hr tablet    Sig: TAKE 1 TABLET BY MOUTH  TWICE DAILY AT 8AM AND 3PM    Dispense:  180 tablet    Refill:  1   DISCONTD: baclofen (LIORESAL) 10 MG tablet    Sig: TAKE 1 TABLET BY MOUTH 2  TIMES DAILY AS  DIRECTED Strength: 10 mg    Dispense:  180 tablet    Refill:  1   DULoxetine (CYMBALTA) 60 MG capsule    Sig: Take 1 capsule (60 mg total) by mouth daily.  Dispense:  90 capsule    Refill:  1   eszopiclone (LUNESTA) 2 MG TABS tablet    Sig: Take 1 tablet (2 mg total) by mouth at bedtime as needed for sleep. Take immediately before bedtime    Dispense:  30 tablet    Refill:  5   furosemide (LASIX) 20 MG tablet    Sig: TAKE ONE TABLET BY MOUTH DAILY AS NEEDED FOR SWELLING    Dispense:  90 tablet    Refill:  0   metoprolol succinate (TOPROL-XL) 25 MG 24 hr tablet    Sig: Take 1 tablet (25 mg total) by mouth at bedtime. For Blood pressure    Dispense:  90 tablet    Refill:  1   spironolactone (ALDACTONE) 25 MG tablet    Sig: Take 1 tablet (25 mg total) by mouth daily.    Dispense:  90 tablet    Refill:  1   baclofen (LIORESAL) 10 MG tablet    Sig: Take 1 tablet (10 mg total) by mouth 2 (two) times daily as needed for muscle spasms. TAKE 1 TABLET BY MOUTH 2  TIMES DAILY AS DIRECTED Strength: 10 mg    Dispense:  180 tablet    Refill:  1   Betamethasone Valerate 0.12 % foam    Sig: Apply 1 application topically 3 (three) times a week.    Dispense:  300 g    Refill:  3    Follow-up: Return in about 6 weeks (around 10/07/2021) for [place].    Beatrice Lecher, MD

## 2021-08-26 NOTE — Patient Instructions (Signed)
Please apply the steroid foam to your scalp after you shampoo your hair.  While your hair and scalp are still wet massage and gently with your fingertips and then wash her fingertips.  Okay to use 3 times per week.  If not improving after 1 month then please let me know.

## 2021-08-26 NOTE — Telephone Encounter (Signed)
Pt left a message stating she is needing an appt to be remeasured for her braces that was lost last year.  I returned call and left a message that since it has been over a year since pt has seen one of our providers she would need to have an appt to be evaluated again. Before we can make the appt for the brace. I asked pt to call back next week or the week after and we will try to get pt scheduled for both appts on the same day.

## 2021-08-27 ENCOUNTER — Other Ambulatory Visit: Payer: Self-pay | Admitting: *Deleted

## 2021-08-27 DIAGNOSIS — R7989 Other specified abnormal findings of blood chemistry: Secondary | ICD-10-CM

## 2021-08-27 LAB — COMPLETE METABOLIC PANEL WITH GFR
AG Ratio: 1.8 (calc) (ref 1.0–2.5)
ALT: 12 U/L (ref 6–29)
AST: 12 U/L (ref 10–35)
Albumin: 4 g/dL (ref 3.6–5.1)
Alkaline phosphatase (APISO): 87 U/L (ref 37–153)
BUN/Creatinine Ratio: 14 (calc) (ref 6–22)
BUN: 36 mg/dL — ABNORMAL HIGH (ref 7–25)
CO2: 27 mmol/L (ref 20–32)
Calcium: 9.3 mg/dL (ref 8.6–10.4)
Chloride: 103 mmol/L (ref 98–110)
Creat: 2.56 mg/dL — ABNORMAL HIGH (ref 0.60–0.95)
Globulin: 2.2 g/dL (calc) (ref 1.9–3.7)
Glucose, Bld: 218 mg/dL — ABNORMAL HIGH (ref 65–99)
Potassium: 5.3 mmol/L (ref 3.5–5.3)
Sodium: 141 mmol/L (ref 135–146)
Total Bilirubin: 0.4 mg/dL (ref 0.2–1.2)
Total Protein: 6.2 g/dL (ref 6.1–8.1)
eGFR: 18 mL/min/{1.73_m2} — ABNORMAL LOW (ref >=60)

## 2021-08-27 LAB — CBC WITH DIFFERENTIAL/PLATELET
Absolute Monocytes: 566 cells/uL (ref 200–950)
Basophils Absolute: 67 cells/uL (ref 0–200)
Basophils Relative: 0.7 %
Eosinophils Absolute: 154 cells/uL (ref 15–500)
Eosinophils Relative: 1.6 %
HCT: 41.7 % (ref 35.0–45.0)
Hemoglobin: 13.5 g/dL (ref 11.7–15.5)
Lymphs Abs: 1786 cells/uL (ref 850–3900)
MCH: 30.5 pg (ref 27.0–33.0)
MCHC: 32.4 g/dL (ref 32.0–36.0)
MCV: 94.1 fL (ref 80.0–100.0)
MPV: 11.5 fL (ref 7.5–12.5)
Monocytes Relative: 5.9 %
Neutro Abs: 7027 cells/uL (ref 1500–7800)
Neutrophils Relative %: 73.2 %
Platelets: 239 10*3/uL (ref 140–400)
RBC: 4.43 10*6/uL (ref 3.80–5.10)
RDW: 11.8 % (ref 11.0–15.0)
Total Lymphocyte: 18.6 %
WBC: 9.6 10*3/uL (ref 3.8–10.8)

## 2021-08-27 LAB — MICROALBUMIN, URINE: Microalb, Ur: 9.2 mg/dL

## 2021-08-27 LAB — LIPID PANEL
Cholesterol: 148 mg/dL (ref <200)
HDL: 53 mg/dL (ref >=50)
LDL Cholesterol (Calc): 72 mg/dL (calc)
Non-HDL Cholesterol (Calc): 95 mg/dL (calc) (ref <130)
Total CHOL/HDL Ratio: 2.8 (calc) (ref <5.0)
Triglycerides: 151 mg/dL — ABNORMAL HIGH (ref <150)

## 2021-08-27 LAB — TSH: TSH: 3.2 mIU/L (ref 0.40–4.50)

## 2021-08-27 LAB — LIPASE: Lipase: 18 U/L (ref 7–60)

## 2021-08-27 NOTE — Progress Notes (Signed)
Call patient: LDL cholesterol looks great.  Triglycerides also look better than last time so great work.  Kidney function is back up again.  It had jumped up in June and then it went back down when we rechecked it and now its back up again so not quite sure what is going on except for the fact that she does look very dry on her blood work.  May need to start decreasing how often she is taking her Lasix.  And plan to recheck BMP in 2 week or after Thanksgiving is fine.  Thyroid is normal.  White count looks great.  No anemia or infection.

## 2021-08-30 LAB — FUNGAL STAIN
FUNGAL SMEAR:: NONE SEEN
MICRO NUMBER:: 12654628
SPECIMEN QUALITY:: ADEQUATE

## 2021-08-30 NOTE — Progress Notes (Signed)
Call patient: She did have a fair amount of stool in her belly.  So I think we need to help clear some of that out and I do think she would actually feel better and her nausea should improve.  I would recommend 1 capful of MiraLAX mixed with 6 to 8 ounces of water twice a day until she has a soft bowel movement that could take a couple days or even up to a week.  When she is getting softer schools then okay to decrease down to 1 capful nightly.

## 2021-08-31 NOTE — Progress Notes (Signed)
Hi Aryn, the skin scraping was negative for any type of fungus which is reassuring.  I am hoping that the new steroid foam is more helpful.

## 2021-09-01 ENCOUNTER — Telehealth: Payer: Self-pay | Admitting: Podiatry

## 2021-09-01 NOTE — Telephone Encounter (Signed)
Pt left message yesterday afternoon about needing an appt to see the doctor and be measured for brace again as her last one was lost in shipping.  I returned call and left message and pt called back and is scheduled to see Dr Margarette Asal and Aaron Edelman on 12.12

## 2021-09-15 DIAGNOSIS — E119 Type 2 diabetes mellitus without complications: Secondary | ICD-10-CM | POA: Diagnosis not present

## 2021-09-20 ENCOUNTER — Other Ambulatory Visit: Payer: Medicare PPO

## 2021-09-20 ENCOUNTER — Ambulatory Visit: Payer: Medicare PPO | Admitting: Podiatry

## 2021-09-21 ENCOUNTER — Other Ambulatory Visit: Payer: Self-pay | Admitting: Family Medicine

## 2021-09-21 DIAGNOSIS — I10 Essential (primary) hypertension: Secondary | ICD-10-CM

## 2021-09-30 ENCOUNTER — Telehealth: Payer: Self-pay | Admitting: *Deleted

## 2021-09-30 NOTE — Telephone Encounter (Signed)
Pt's son call and wanted to inform and hopefully discuss his mother's recent behavior. He said that his mother drove from [place] to [place] and was sitting in his driveway. She had been calling him frantically back to back over 20+ times today. He said that he was in [place] and had to call the [employer] department and have them let her in until he got there.  He would like to discuss his mothers medications with Dr. Madilyn Fireman because he has read the side effects of her medications and questions whether or not her sleeping pills in addition to her other medications could be causing her to react this way.  I asked him if his mother was in a safe environment he said that she is at his house and is doing fine.  Fwd to pcp for advice.

## 2021-10-07 ENCOUNTER — Ambulatory Visit: Payer: Medicare PPO | Admitting: Family Medicine

## 2021-10-07 ENCOUNTER — Telehealth: Payer: Self-pay | Admitting: *Deleted

## 2021-10-07 NOTE — Telephone Encounter (Signed)
Pt called to inform:

## 2021-10-11 DIAGNOSIS — Z7983 Long term (current) use of bisphosphonates: Secondary | ICD-10-CM | POA: Diagnosis not present

## 2021-10-11 DIAGNOSIS — Z791 Long term (current) use of non-steroidal anti-inflammatories (NSAID): Secondary | ICD-10-CM | POA: Diagnosis not present

## 2021-10-11 DIAGNOSIS — R4182 Altered mental status, unspecified: Secondary | ICD-10-CM | POA: Diagnosis not present

## 2021-10-11 DIAGNOSIS — F0390 Unspecified dementia without behavioral disturbance: Secondary | ICD-10-CM | POA: Diagnosis not present

## 2021-10-11 DIAGNOSIS — Z20822 Contact with and (suspected) exposure to covid-19: Secondary | ICD-10-CM | POA: Diagnosis not present

## 2021-10-11 DIAGNOSIS — Z79899 Other long term (current) drug therapy: Secondary | ICD-10-CM | POA: Diagnosis not present

## 2021-10-11 DIAGNOSIS — M8580 Other specified disorders of bone density and structure, unspecified site: Secondary | ICD-10-CM | POA: Diagnosis not present

## 2021-10-11 DIAGNOSIS — R6889 Other general symptoms and signs: Secondary | ICD-10-CM | POA: Diagnosis not present

## 2021-10-11 DIAGNOSIS — R410 Disorientation, unspecified: Secondary | ICD-10-CM | POA: Diagnosis not present

## 2021-10-11 DIAGNOSIS — E7800 Pure hypercholesterolemia, unspecified: Secondary | ICD-10-CM | POA: Diagnosis not present

## 2021-10-21 ENCOUNTER — Encounter: Payer: Self-pay | Admitting: Family Medicine

## 2021-10-21 ENCOUNTER — Ambulatory Visit: Payer: Medicare PPO | Admitting: Family Medicine

## 2021-10-21 ENCOUNTER — Other Ambulatory Visit: Payer: Self-pay

## 2021-10-21 VITALS — BP 139/61 | HR 73 | Resp 18 | Ht 65.0 in | Wt 254.0 lb

## 2021-10-21 DIAGNOSIS — I10 Essential (primary) hypertension: Secondary | ICD-10-CM | POA: Diagnosis not present

## 2021-10-21 DIAGNOSIS — R413 Other amnesia: Secondary | ICD-10-CM | POA: Diagnosis not present

## 2021-10-21 DIAGNOSIS — F5101 Primary insomnia: Secondary | ICD-10-CM | POA: Diagnosis not present

## 2021-10-21 NOTE — Assessment & Plan Note (Signed)
Lately off of Lunesta which I think is great just to make sure that it is not abutting the waters and causing any side effects or impairment.

## 2021-10-21 NOTE — Progress Notes (Signed)
Patient has appointment scheduled with [facility] on 11/11/21.

## 2021-10-21 NOTE — Assessment & Plan Note (Signed)
Blood pressure actually looks good today.  It sounds like she is now using a pillbox for her medication wounds which is think is helpful think it is a big safety issue so just making sure that she is taking them consistently and not missing them and/or incidentally taking extra.

## 2021-10-21 NOTE — Progress Notes (Signed)
Established Patient Office Visit  Subjective:  Patient ID: Carolyn Lambert, female    DOB: 1941-04-24  Age: 81 y.o. MRN: [id number]  CC:  Chief Complaint  Patient presents with   Discuss memory     Patient and Patients son would like to discuss ED follow up for Dementia diagnosis.    Oxygen Tank    Patient requesting for a new prescription for Oxygen Tank ( 2 liters) sent to [facility] in [facility].     HPI [name] presents for f/u Mood.    Her son had also called right after Christmas a couple of weeks ago and let us know that she had an episode where she drove to his home and kept calling him over and over.  He was in [place] at the time and was not home and so ended up having to call the sheriff's department to go out and check on her and see what was going on.  Called because he was concerned that maybe her sleep medication or something was affecting her.  He also ended up going to the hospital on January 2 and being evaluated her son who is here with her today which is not the son who called [name] previously, said that they were told that she had the beginnings of dementia.  She had labs and CT` head scan.  Unfortunately I am actually unable to visualize those reports because they put her date of birth down incorrectly so it is not matching to her chart.  They had a birthdate of November 01, 1940 and her actual birth date is 22-Jun-1941.  Hypertension- Pt denies chest pain, SOB, dizziness, or heart palpitations.  Taking meds as directed w/o problems.  Denies medication side effects.    Diabetes - no hypoglycemic events. No wounds or sores that are not healing well. No increased thirst or urination. Checking glucose at home. Taking medications as prescribed without any side effects.  Patient requesting for a new prescription for Oxygen concentrator that she has in her home to [facility] in [place].  She says its not a portable concentrator it is large and  sits on the floor.  But they need a new order.  Past Medical History:  Diagnosis Date   Anxiety and depression    Arthritis    Chronic fatigue syndrome    Diabetes mellitus    DVT (deep venous thrombosis) (HCC)    s/p knee replacmet.    Fibromyalgia    GERD (gastroesophageal reflux disease)    Hypercholesteremia    Hypertension    Major depressive disorder 03/20/2012   Obesity    Ovarian cancer ([place])    Peripheral edema    Sinusitis July 2013   Sleep apnea    O2 concentrator at night    Past Surgical History:  Procedure Laterality Date   ABDOMINAL HYSTERECTOMY     CATARACT EXTRACTION W/ INTRAOCULAR LENS & ANTERIOR VITRECTOMY Left 11/02/26   CATARACT EXTRACTION W/ INTRAOCULAR LENS IMPLANT Right 09/28/2016   LUMBAR DISC SURGERY     x 2   ORIF ANKLE FRACTURE Right 11/13/2018   [facility]   REPLACEMENT TOTAL KNEE Bilateral    2 on left and 1 on right, Dr Wynelle Link   ROTATOR CUFF REPAIR Right    TONSILLECTOMY     VEIN LIGATION AND STRIPPING      Family History  Problem Relation Age of Onset   Hypertension Mother    Atrial fibrillation Mother  Diabetes Mother    Hyperlipidemia Mother    Diabetes type II Father    Lung cancer Father    AAA (abdominal aortic aneurysm) Father    Depression Daughter    Diabetes Daughter    Diabetes Son    Depression Son    Depression Son    Depression Son    Colon cancer Neg Hx     Social History   Socioeconomic History   Marital status: Married    Spouse name: Not on file   Number of children: 5   Years of education: 12   Highest education level: GED or equivalent  Occupational History   Occupation: retired  Tobacco Use   Smoking status: Never   Smokeless tobacco: Never  Scientific laboratory technician Use: Never used  Substance and Sexual Activity   Alcohol use: Yes    Comment: Occasionally, wine   Drug use: No    Comment: Caffeine: Coffee -2-3  cups per day.    Sexual activity: Not Currently  Other Topics Concern   Not on file   Social History Narrative   Lives alone but her sister does live close by. She enjoys watching t.v. in her free time.   Social Determinants of Health   Financial Resource Strain: Low Risk    Difficulty of Paying Living Expenses: Not hard at all  Food Insecurity: No Food Insecurity   Worried About Charity fundraiser in the Last Year: Never true   [place] in the Last Year: Never true  Transportation Needs: No Transportation Needs   Lack of Transportation (Medical): No   Lack of Transportation (Non-Medical): No  Physical Activity: Inactive   Days of Exercise per Week: 0 days   Minutes of Exercise per Session: 0 min  Stress: Stress Concern Present   Feeling of Stress : To some extent  Social Connections: Socially Isolated   Frequency of Communication with Friends and Family: More than three times a week   Frequency of Social Gatherings with Friends and Family: Twice a week   Attends Religious Services: Never   Marine scientist or Organizations: No   Attends Archivist Meetings: Never   Marital Status: Widowed  Human resources officer Violence: Not At Risk   Fear of Current or Ex-Partner: No   Emotionally Abused: No   Physically Abused: No   Sexually Abused: No    Outpatient Medications Prior to Visit  Medication Sig Dispense Refill   Alcohol Swabs (B-D SINGLE USE SWABS REGULAR) PADS Clean test area with swab before testing. 1 each 3   alendronate (FOSAMAX) 70 MG tablet TAKE 1 TABLET(70 MG) BY MOUTH 1 TIME A WEEK WITH A FULL GLASS OF WATER AND ON AN EMPTY STOMACH 12 tablet 3   AMBULATORY NON FORMULARY MEDICATION Dx DM E11.9 - DM shoes - Fax to [phone] 1 each 0   amLODipine (NORVASC) 10 MG tablet Take 10 mg by mouth daily.     ammonium lactate (LAC-HYDRIN) 12 % lotion Apply 1 application topically daily. After showers     aspirin EC 81 MG tablet Take 81 mg by mouth daily.     atorvastatin (LIPITOR) 20 MG tablet Take 1 tablet (20 mg total) by mouth daily. 90  tablet 3   baclofen (LIORESAL) 10 MG tablet Take 1 tablet (10 mg total) by mouth 2 (two) times daily as needed for muscle spasms. TAKE 1 TABLET BY MOUTH 2  TIMES DAILY AS DIRECTED Strength: 10  mg 180 tablet 1   Betamethasone Valerate 0.12 % foam Apply 1 application topically 3 (three) times a week. 300 g 3   buPROPion (WELLBUTRIN XL) 150 MG 24 hr tablet TAKE 1 TABLET BY MOUTH  TWICE DAILY AT 8AM AND 3PM 180 tablet 1   DULoxetine (CYMBALTA) 60 MG capsule Take 1 capsule (60 mg total) by mouth daily. 90 capsule 1   furosemide (LASIX) 20 MG tablet TAKE ONE TABLET BY MOUTH DAILY AS NEEDED FOR SWELLING 90 tablet 0   metFORMIN (GLUCOPHAGE) 1000 MG tablet Take 1 tablet (1,000 mg total) by mouth daily with breakfast. 90 tablet 3   metoprolol succinate (TOPROL-XL) 25 MG 24 hr tablet Take 1 tablet (25 mg total) by mouth at bedtime. For Blood pressure 90 tablet 1   Prodigy Twist Top Lancets 28G MISC Use as directed. 1 each 3   RESTASIS 0.05 % ophthalmic emulsion      OXYGEN Inhale 2 L/min into the lungs at bedtime. O2 concentrator (Patient not taking: Reported on 10/21/2021)     spironolactone (ALDACTONE) 25 MG tablet Take 1 tablet (25 mg total) by mouth daily. 90 tablet 1   AMBULATORY NON FORMULARY MEDICATION Medication Name: Large Blood Pressure Cuff. Dx: I10 1 each 0   eszopiclone (LUNESTA) 2 MG TABS tablet Take 1 tablet (2 mg total) by mouth at bedtime as needed for sleep. Take immediately before bedtime 30 tablet 5   No facility-administered medications prior to visit.    Allergies  Allergen Reactions   Losartan Other (See Comments) and Hives    Cough  Cough    Gabapentin Other (See Comments)    dizziness   Lisinopril Other (See Comments)    Dry cough   Oxycodone Nausea And Vomiting   Oxycodone-Aspirin Nausea And Vomiting    ROS Review of Systems    Objective:    Physical Exam  BP 139/61    Pulse 73    Resp 18    Ht '5\' 5"'  (1.651 m)    Wt 254 lb (115.2 kg)    SpO2 95%    BMI 42.27 kg/m   Wt Readings from Last 3 Encounters:  10/21/21 254 lb (115.2 kg)  08/26/21 246 lb (111.6 kg)  06/22/21 246 lb (111.6 kg)     Health Maintenance Due  Topic Date Due   Zoster Vaccines- Shingrix (2 of 2) 09/23/2021    There are no preventive care reminders to display for this patient.  Lab Results  Component Value Date   TSH 3.20 08/26/2021   Lab Results  Component Value Date   WBC 9.6 08/26/2021   HGB 13.5 08/26/2021   HCT 41.7 08/26/2021   MCV 94.1 08/26/2021   PLT 239 08/26/2021   Lab Results  Component Value Date   NA 141 08/26/2021   K 5.3 08/26/2021   CO2 27 08/26/2021   GLUCOSE 218 (H) 08/26/2021   BUN 36 (H) 08/26/2021   CREATININE 2.56 (H) 08/26/2021   BILITOT 0.4 08/26/2021   ALKPHOS 81 05/08/2017   AST 12 08/26/2021   ALT 12 08/26/2021   PROT 6.2 08/26/2021   ALBUMIN 4.0 05/08/2017   CALCIUM 9.3 08/26/2021   ANIONGAP 6 07/22/2016   EGFR 18 (L) 08/26/2021   GFR 87.26 07/29/2013   Lab Results  Component Value Date   CHOL 148 08/26/2021   Lab Results  Component Value Date   HDL 53 08/26/2021   Lab Results  Component Value Date   LDLCALC 72 08/26/2021  Lab Results  Component Value Date   TRIG 151 (H) 08/26/2021   Lab Results  Component Value Date   CHOLHDL 2.8 08/26/2021   Lab Results  Component Value Date   HGBA1C 5.7 (A) 06/22/2021      Assessment & Plan:   Problem List Items Addressed This Visit       Cardiovascular and Mediastinum   Essential hypertension    Blood pressure actually looks good today.  It sounds like she is now using a pillbox for her medication wounds which is think is helpful think it is a big safety issue so just making sure that she is taking them consistently and not missing them and/or incidentally taking extra.      Relevant Medications   amLODipine (NORVASC) 10 MG tablet     Other   Memory impairment - Primary    She actually scored a 28 out of 30 on the MMSE today.  But it sounds like she is  actually having pretty significant symptoms.  She is had confusion while driving places and then getting lost.  She has been more forgetful.  She is now using a pillbox.  Again I am unable to access some of the imaging and labs that were done in the ED we will have to correct her birthdate and the systems that it matches in care everywhere since these were done at [place].  Does have an appointment with neurology in about 2 weeks which is perfect they can do further work-up to differentiate what type of dementia she may have and if she would benefit from any type of intervention or treatment.  And also discussed safety issues.  Currently she has been advised not to drive or cook.  And they can determine better if she may need assistance and may not be able to live alone any longer.  We discussed that today during the appointment      Insomnia    Lately off of [place] which I think is great just to make sure that it is not abutting the waters and causing any side effects or impairment.       Encouraged her son who was here today to get the hospital to correct the birthdate issue so that the correct scans and blood work could actually be entered into the correct chart so that they can be reviewed by other providers and especially the neurologist who is going to see her at the end of the month.  No orders of the defined types were placed in this encounter.   Follow-up: Return in about 2 months (around 12/19/2021).    Beatrice Lecher, MD

## 2021-10-21 NOTE — Assessment & Plan Note (Signed)
She actually scored a 28 out of 30 on the MMSE today.  But it sounds like she is actually having pretty significant symptoms.  She is had confusion while driving places and then getting lost.  She has been more forgetful.  She is now using a pillbox.  Again I am unable to access some of the imaging and labs that were done in the ED we will have to correct her birthdate and the systems that it matches in care everywhere since these were done at [place].  Does have an appointment with neurology in about 2 weeks which is perfect they can do further work-up to differentiate what type of dementia she may have and if she would benefit from any type of intervention or treatment.  And also discussed safety issues.  Currently she has been advised not to drive or cook.  And they can determine better if she may need assistance and may not be able to live alone any longer.  We discussed that today during the appointment

## 2021-11-01 ENCOUNTER — Ambulatory Visit: Payer: Medicare PPO | Admitting: Podiatry

## 2021-11-01 ENCOUNTER — Ambulatory Visit: Payer: Medicare PPO

## 2021-11-01 ENCOUNTER — Other Ambulatory Visit: Payer: Self-pay

## 2021-11-01 ENCOUNTER — Ambulatory Visit (INDEPENDENT_AMBULATORY_CARE_PROVIDER_SITE_OTHER): Payer: Medicare PPO

## 2021-11-01 DIAGNOSIS — E11628 Type 2 diabetes mellitus with other skin complications: Secondary | ICD-10-CM

## 2021-11-01 DIAGNOSIS — M21372 Foot drop, left foot: Secondary | ICD-10-CM

## 2021-11-01 DIAGNOSIS — R2681 Unsteadiness on feet: Secondary | ICD-10-CM | POA: Diagnosis not present

## 2021-11-01 DIAGNOSIS — W19XXXS Unspecified fall, sequela: Secondary | ICD-10-CM | POA: Diagnosis not present

## 2021-11-01 DIAGNOSIS — M2141 Flat foot [pes planus] (acquired), right foot: Secondary | ICD-10-CM

## 2021-11-01 DIAGNOSIS — M2142 Flat foot [pes planus] (acquired), left foot: Secondary | ICD-10-CM

## 2021-11-01 DIAGNOSIS — M7751 Other enthesopathy of right foot: Secondary | ICD-10-CM

## 2021-11-01 DIAGNOSIS — M7750 Other enthesopathy of unspecified foot: Secondary | ICD-10-CM

## 2021-11-02 NOTE — Progress Notes (Signed)
SITUATION Patient Name:  Carolyn Lambert MRN:   [id number] Reason for Visit: Evaluation for [facility] AFO  Patient Report: Chief Complaint:   Foot drop and Ankle Instability Petra Kuba of Discomfort/Pain:  Ambulatory Standing Resting Location:    bilateral lower extremity Onset & Duration:   Gradual and Present longer than 3 months Course:    gradually worsening Aggravating or Alleviating Factors: Ambulation, Standing  OBJECTIVE DATA & MEASUREMENTS Prognosis:    Good Duration of use:   5 years  Diagnosis:   ICD-10-CM   1. 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 Flat foot (pes planus) (acquired), left foot  M21.42     3. Flat foot (pes planus) (acquired), right foot  M21.41      GOALS, NECESSITIES, & JUSTIFICATIONS Recommended Device: [place] AFO full foot with insoles bilateral Color:    Black Closure:   Velcro  Laterality HCPCS Code Description Justification  bilateral [id number] Plastic orthosis, custom molded from a model of the patient, custom fabricated, includes casting and cast preparation. Necessary to provide triplanar support to the foot/ankle complex  bilateral L2330 Addition to lower extremity, lacer molded to patient model Necessary to ensure secure hold of orthosis to patient's limb  bilateral L2820 Addition to lower extremity orthosis, soft interface for molded plastic below knee section Necessary to relieve pressure on bony prominences    I certify that Nelda Severe qualifies for and will benefit from an ankle foot orthosis used during ambulation based on meeting all of the following criteria;   The patient is: - Ambulatory, and - Has weakness or deformity of the foot and ankle, and - Requires stabilization for medical reasons, and - Has the potential to benefit functionally  The patients medical record contains sufficient documentation of the patients medical condition to substantiate the necessity for the type and  quantity of the items ordered.  The goals of this therapy: - Improve Mobility - Improve Lower Extremity Stability - Decrease Pain - Facilitate Soft Tissue Healing - Facilitate Immobilization, healing and treatment of an injury  Necessity of Ankle Foot Orthotic molded to patient model: A custom (vs. prefabricated) ankle foot orthosis has been prescribed based on the following criteria which are specific to the condition of this patient; - The patient could not be fit with a prefabricated AFO - The condition necessitating the orthosis is expected to be permanent or of longstanding duration (more than 6 months) - There is need to control the ankle or foot in more than one plane - The patient has a documented neurological, circulatory, or orthopedic condition that requires custom fabrication over a model to prevent tissue injury - The patient has a healing fracture that lacks normal anatomical integrity or anthropometric proportions  I hereby certify that the ankle foot orthotic described above is a rigid or semi-rigid device which is used for the purpose of supporting a weak or deformed body member or restricting or eliminating motion in a diseased or injured part of the body. It is designed to provide support and counterforce on the limb or body part that is being braced. In my opinion, the custom molded ankle foot orthosis is both reasonable and necessary in reference to accepted standards of medical practice in the treatment  of the patient condition and rehabilitation.  ACTIONS PERFORMED Patient was evaluated and casted for Specialty AFO via [employer] Casting Sock. Procedure was explained to patient and family. Patient tolerated procedure. patient  and family selected device color and closure method.   PLAN Patient to return in four to six weeks for fitting and delivery of device. Plan of care was explained to and agreed upon by patient and family. All questions were answered and concerns  addressed.

## 2021-11-02 NOTE — Progress Notes (Signed)
SITUATION Reason for Consult: Evaluation for Prefabricated Diabetic Shoes and Bilateral Custom Diabetic Inserts. Patient / Caregiver Report: Patient would like well fitting shoes  OBJECTIVE DATA: Patient History / Diagnosis:    ICD-10-CM   1. 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 Flat foot (pes planus) (acquired), left foot  M21.42     3. Flat foot (pes planus) (acquired), right foot  M21.41       Current or Previous Devices:   Apex shoes and prefab insoles from an outside facility  In-Person Foot Examination: Ulcers & Callousing:   None and no history  Toe / Foot Deformities:   - Pes Planus    Shoe Size: 10.5XW  ORTHOTIC RECOMMENDATION Recommended Devices: - 1x pair prefabricated PDAC approved diabetic shoes: [id number] 10.5XW - 3x pair custom-to-patient vacuum formed diabetic insoles.   GOALS OF SHOES AND INSOLES - Reduce shear and pressure - Reduce / Prevent callus formation - Reduce / Prevent ulceration - Protect the fragile healing compromised diabetic foot.  Patient would benefit from diabetic shoes and inserts as patient has diabetes mellitus and the patient has one or more of the following conditions: - Peripheral neuropathy with evidence of callus formation - Foot deformity - Poor circulation  ACTIONS PERFORMED Patient was casted for insoles via crush box and measured for shoes via brannock device. Procedure was explained and patient tolerated procedure well. All questions were answered and concerns addressed.  PLAN Patient is to ensure treating physician receives and completes diabetic paperwork. Casts and shoe order are to be held until paperwork is received. Once received patient is to be scheduled for fitting in four weeks.

## 2021-11-04 DIAGNOSIS — R296 Repeated falls: Secondary | ICD-10-CM | POA: Diagnosis not present

## 2021-11-04 DIAGNOSIS — R443 Hallucinations, unspecified: Secondary | ICD-10-CM | POA: Diagnosis not present

## 2021-11-04 DIAGNOSIS — F03A18 Unspecified dementia, mild, with other behavioral disturbance: Secondary | ICD-10-CM | POA: Diagnosis not present

## 2021-11-04 DIAGNOSIS — R519 Headache, unspecified: Secondary | ICD-10-CM | POA: Diagnosis not present

## 2021-11-04 DIAGNOSIS — F32A Depression, unspecified: Secondary | ICD-10-CM | POA: Diagnosis not present

## 2021-11-04 DIAGNOSIS — R251 Tremor, unspecified: Secondary | ICD-10-CM | POA: Diagnosis not present

## 2021-11-04 LAB — METHYLMALONIC ACID, SERUM: Methylmalonic Acid: 1120

## 2021-11-04 LAB — VITAMIN B12: Vitamin B-12: 221

## 2021-11-05 NOTE — Progress Notes (Signed)
Subjective: 81 year old female presents the office today for concerns of dropfoot on her left side.  Also she has a history of an ankle fracture on the right side and she had chronic pain since then she is asking for a brace on the right side as well as she feels that she has better stability.  She did not get the brace for the left dropfoot previously she presents today for molding of the brace with our orthotist, Aaron Edelman today.  No recent injuries.   Objective: AAO x3, NAD DP/PT pulses palpable bilaterally, CRT less than 3 seconds Dropfoot present on the left side.  There is chronic appearing edema present bilaterally.  Tenderness to palpation on the right ankle particularly on the lateral aspect ankle with the previous fracture was noted.  Tenderness along the lateral ankle complex along the ATFL, CFL.  No area of pinpoint tenderness bilaterally.  No pain with calf compression, swelling, warmth, erythema  Assessment: Left dropfoot; chronic right ankle pain, instability  Plan: -All treatment options discussed with the patient including all alternatives, risks, complications.  -X-rays obtained reviewed.  Previous ankle fracture ORIF noted.  No subacute fracture. -We discussed bracing options.  She was seen today by our orthotist, Aaron Edelman for braces.  Over the braces will help increase gait, stability and help decrease pain. -Patient encouraged to call the office with any questions, concerns, change in symptoms.   Trula Slade DPM

## 2021-11-08 ENCOUNTER — Telehealth: Payer: Self-pay | Admitting: Podiatry

## 2021-11-08 NOTE — Telephone Encounter (Signed)
Per Stacy @ [employer] pts plan is active as of 1.1.2021 and we are in network. for brace codes L1940/L2820/L2330 no Josem Kaufmann is required as far as valid and billable follows same guidelines as medicare. Covered @ 80% no deductible, out of pocket is 4000.00(met 20.00) if billed with office visit pt has 40.00 copay. Reference number [id number].Marland Kitchen

## 2021-11-08 NOTE — Telephone Encounter (Signed)
Pt called asking if we had received the signed paperwork needed for her brace.  Upon checking it was for her brace and diabetic shoes and we have not received it back yet.  Then I received a call from pts new poa and I explained that we have not received the needed documents for pts diabetic shoes and it was faxed to pcp last thursday and today again. I also explained that I get an notification when the documents come in.He has a call into the pcp office already and will ask about this when they call him back.

## 2021-11-09 ENCOUNTER — Telehealth: Payer: Self-pay | Admitting: Family Medicine

## 2021-11-09 DIAGNOSIS — E119 Type 2 diabetes mellitus without complications: Secondary | ICD-10-CM

## 2021-11-09 DIAGNOSIS — F332 Major depressive disorder, recurrent severe without psychotic features: Secondary | ICD-10-CM

## 2021-11-09 DIAGNOSIS — R413 Other amnesia: Secondary | ICD-10-CM

## 2021-11-09 DIAGNOSIS — F5101 Primary insomnia: Secondary | ICD-10-CM

## 2021-11-09 DIAGNOSIS — R443 Hallucinations, unspecified: Secondary | ICD-10-CM

## 2021-11-09 DIAGNOSIS — I10 Essential (primary) hypertension: Secondary | ICD-10-CM

## 2021-11-09 NOTE — Telephone Encounter (Signed)
Dr. Synetta Shadow Carolyn Lambert's grandson called and stated he recently became Power of Attorney to her.  He said he had taken her to [facility] to a memory care provider and they gave him some referrals that Carolyn Lambert would need.  He also stated she was getting ready to move to [facility] and would like for [name] to send the referral there.  He stated last week he had her at the podiatrist and they faxed paperwork for her to order special shoes and Braces and said they can't get them until the forms are done.  Carolyn Lambert is also trying to get her medicine pre packaged for her and said they drew labs at one of her appointments and her B-12 was low and he didn't know if she needed a referral for this.  He is asking for the following:   1.Referrals to Neurology Geriatric Psych  Memory Care  2. Paperwork that was faxed for braces and shoes by the podiatrist so they can get them ordered  3. Prescriptions sent to [facility].  Please Advise Carolyn Lambert

## 2021-11-10 MED ORDER — FUROSEMIDE 20 MG PO TABS: ORAL_TABLET | ORAL | 0 refills | Status: AC

## 2021-11-10 MED ORDER — METFORMIN HCL ER 500 MG PO TB24: 500.0000 mg | ORAL_TABLET | Freq: Every day | ORAL | 1 refills | Status: DC

## 2021-11-10 MED ORDER — BETAMETHASONE VALERATE 0.12 % EX FOAM: 1.0000 "application " | CUTANEOUS | 3 refills | Status: DC

## 2021-11-10 MED ORDER — BUPROPION HCL ER (XL) 150 MG PO TB24: ORAL_TABLET | ORAL | 1 refills | Status: AC

## 2021-11-10 MED ORDER — ATORVASTATIN CALCIUM 20 MG PO TABS: 20.0000 mg | ORAL_TABLET | Freq: Every day | ORAL | 3 refills | Status: AC

## 2021-11-10 MED ORDER — ALENDRONATE SODIUM 70 MG PO TABS: ORAL_TABLET | ORAL | 3 refills | Status: AC

## 2021-11-10 MED ORDER — METOPROLOL SUCCINATE ER 25 MG PO TB24: 25.0000 mg | ORAL_TABLET | Freq: Every day | ORAL | 1 refills | Status: AC

## 2021-11-10 MED ORDER — DULOXETINE HCL 60 MG PO CPEP: 60.0000 mg | ORAL_CAPSULE | Freq: Every day | ORAL | 1 refills | Status: AC

## 2021-11-10 MED ORDER — AMLODIPINE BESYLATE 10 MG PO TABS: 10.0000 mg | ORAL_TABLET | Freq: Every day | ORAL | 0 refills | Status: AC

## 2021-11-10 NOTE — Telephone Encounter (Signed)
There is a release of medical information in the chart to be able to talk with her grandson. I spoke with him and he is going to have the Neurologist fax [name] the records of the office visit and labs.   He has agreed with a [place] referral to a geriatric psychiatrist.   He spoke with the company about the diabetic shoes on Monday and they report they have not received the paperwork. I will check the faxed forms folder.

## 2021-11-10 NOTE — Telephone Encounter (Signed)
Received faxed note about B12 but no actual lab value was sent so I can't make a recommendation until I know the results.   [facility] referral for Pysch iand Neuro n [place].  Orders Placed This Encounter  Procedures   Ambulatory referral to Psychiatry    Referral Priority:   Routine    Referral Type:   Psychiatric    Referral Reason:   Specialty Services Required    Requested Specialty:   Psychiatry    Number of Visits Requested:   1   Ambulatory referral to Neurology    Referral Priority:   Routine    Referral Type:   Consultation    Referral Reason:   Specialty Services Required    Requested Specialty:   Neurology    Number of Visits Requested:   1    Meds ordered this encounter  Medications   alendronate (FOSAMAX) 70 MG tablet    Sig: TAKE 1 TABLET(70 MG) BY MOUTH 1 TIME A WEEK WITH A FULL GLASS OF WATER AND ON AN EMPTY STOMACH    Dispense:  12 tablet    Refill:  3   amLODipine (NORVASC) 10 MG tablet    Sig: Take 1 tablet (10 mg total) by mouth daily.    Dispense:  90 tablet    Refill:  0   atorvastatin (LIPITOR) 20 MG tablet    Sig: Take 1 tablet (20 mg total) by mouth daily.    Dispense:  90 tablet    Refill:  3    Requesting 1 year supply   metoprolol succinate (TOPROL-XL) 25 MG 24 hr tablet    Sig: Take 1 tablet (25 mg total) by mouth at bedtime. For Blood pressure    Dispense:  90 tablet    Refill:  1   furosemide (LASIX) 20 MG tablet    Sig: TAKE ONE TABLET BY MOUTH DAILY AS NEEDED FOR SWELLING    Dispense:  45 tablet    Refill:  0   DULoxetine (CYMBALTA) 60 MG capsule    Sig: Take 1 capsule (60 mg total) by mouth daily.    Dispense:  90 capsule    Refill:  1   buPROPion (WELLBUTRIN XL) 150 MG 24 hr tablet    Sig: TAKE 1 TABLET BY MOUTH  TWICE DAILY AT 8AM AND 3PM    Dispense:  180 tablet    Refill:  1   Betamethasone Valerate 0.12 % foam    Sig: Apply 1 application topically 3 (three) times a week.    Dispense:  300 g    Refill:  3   metFORMIN  (GLUCOPHAGE-XR) 500 MG 24 hr tablet    Sig: Take 1 tablet (500 mg total) by mouth daily with breakfast.    Dispense:  90 tablet    Refill:  1

## 2021-11-10 NOTE — Telephone Encounter (Signed)
Faxed paperwork for shoes with office notes.

## 2021-11-10 NOTE — Telephone Encounter (Signed)
I understand.  But we also have to have a copy of the power of attorney and whether or not patient has been deemed unable to make her own medical decisions.  If she has then we can move forward otherwise we have to call Carolyn Lambert herself and verify if she is okay with the referrals.    When I last saw her a couple of weeks ago she had an appointment with neurology already.  Did she make it to that appointment they were going to start the initial work-up for memory impairment.  Unfortunately when I saw her during the visit we could not access any of her information because they had her incorrect date of birth at [place].  As far as I can tell it still has not been corrected so I still cannot get any of her scans or imaging for further evaluation.    We had actually tried to refer her to a geriatric psychiatrist back in the summer and we were having significant difficulty finding someone who was taking new patients.  We can certainly try in the [place] area again if she is okay with that.   Also I am a little confused if he already has referrals in place then I am not clear what we need to do on our end.  And who was the memory care provider in [place]?  Was this the neurology appointment that she had after she saw me?  In regards to the diabetic shoes-paper work was already faxed last week back for the shoes so he may want to check on that later this week.  In regards to the B12 -  I am not sure what he is talking about I have not seen any labs that included a B12 if he has a copy of those and more than happy to look at them and make a recommendation, but I do not have that on file.

## 2021-11-11 ENCOUNTER — Telehealth: Payer: Self-pay

## 2021-11-11 DIAGNOSIS — H524 Presbyopia: Secondary | ICD-10-CM | POA: Diagnosis not present

## 2021-11-11 DIAGNOSIS — H35362 Drusen (degenerative) of macula, left eye: Secondary | ICD-10-CM | POA: Diagnosis not present

## 2021-11-11 DIAGNOSIS — E119 Type 2 diabetes mellitus without complications: Secondary | ICD-10-CM | POA: Diagnosis not present

## 2021-11-11 LAB — HM DIABETES EYE EXAM

## 2021-11-11 NOTE — Telephone Encounter (Signed)
Edison Nasuti advised. Also Dr Madilyn Fireman did find the B12 levels and it was low. He is going to give her B12 OTC supplements. No other questions.

## 2021-11-11 NOTE — Telephone Encounter (Signed)
Paulino Door - patient's son, called stating he is now the patient's power of attorney. He is requesting for patient's pharmacy to be updated to [facility]. He wants all of patient's current medication to be sent to this pharmacy. Thanks in advance.

## 2021-11-12 NOTE — Telephone Encounter (Signed)
Medications sent

## 2021-12-13 DIAGNOSIS — E119 Type 2 diabetes mellitus without complications: Secondary | ICD-10-CM | POA: Diagnosis not present

## 2021-12-13 DIAGNOSIS — L219 Seborrheic dermatitis, unspecified: Secondary | ICD-10-CM | POA: Diagnosis not present

## 2021-12-13 DIAGNOSIS — H04123 Dry eye syndrome of bilateral lacrimal glands: Secondary | ICD-10-CM | POA: Diagnosis not present

## 2021-12-13 DIAGNOSIS — F332 Major depressive disorder, recurrent severe without psychotic features: Secondary | ICD-10-CM | POA: Diagnosis not present

## 2021-12-13 DIAGNOSIS — Z6841 Body Mass Index (BMI) 40.0 and over, adult: Secondary | ICD-10-CM | POA: Diagnosis not present

## 2021-12-13 DIAGNOSIS — M21379 Foot drop, unspecified foot: Secondary | ICD-10-CM | POA: Diagnosis not present

## 2021-12-13 DIAGNOSIS — M810 Age-related osteoporosis without current pathological fracture: Secondary | ICD-10-CM | POA: Diagnosis not present

## 2021-12-13 DIAGNOSIS — E7800 Pure hypercholesterolemia, unspecified: Secondary | ICD-10-CM | POA: Diagnosis not present

## 2021-12-13 DIAGNOSIS — I10 Essential (primary) hypertension: Secondary | ICD-10-CM | POA: Diagnosis not present

## 2021-12-13 DIAGNOSIS — R600 Localized edema: Secondary | ICD-10-CM | POA: Diagnosis not present

## 2021-12-13 DIAGNOSIS — E538 Deficiency of other specified B group vitamins: Secondary | ICD-10-CM | POA: Diagnosis not present

## 2021-12-14 ENCOUNTER — Other Ambulatory Visit: Payer: Self-pay

## 2021-12-14 ENCOUNTER — Ambulatory Visit (INDEPENDENT_AMBULATORY_CARE_PROVIDER_SITE_OTHER): Payer: Medicare PPO

## 2021-12-14 DIAGNOSIS — M7751 Other enthesopathy of right foot: Secondary | ICD-10-CM

## 2021-12-14 DIAGNOSIS — E109 Type 1 diabetes mellitus without complications: Secondary | ICD-10-CM | POA: Diagnosis not present

## 2021-12-14 DIAGNOSIS — M21372 Foot drop, left foot: Secondary | ICD-10-CM

## 2021-12-14 DIAGNOSIS — L84 Corns and callosities: Secondary | ICD-10-CM | POA: Diagnosis not present

## 2021-12-14 DIAGNOSIS — M2141 Flat foot [pes planus] (acquired), right foot: Secondary | ICD-10-CM

## 2021-12-14 DIAGNOSIS — M2142 Flat foot [pes planus] (acquired), left foot: Secondary | ICD-10-CM

## 2021-12-14 DIAGNOSIS — E11628 Type 2 diabetes mellitus with other skin complications: Secondary | ICD-10-CM

## 2021-12-14 DIAGNOSIS — R2681 Unsteadiness on feet: Secondary | ICD-10-CM | POA: Diagnosis not present

## 2021-12-14 NOTE — Progress Notes (Signed)
SITUATION ?Reason for Visit: Fitting of Diabetic [place] ?Patient / Caregiver Report:  Patient is satisfied with fit and function of shoes and insoles. ? ?OBJECTIVE DATA: ?Patient History / Diagnosis:   ?  ICD-10-CM   ?1. 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 L84   ?  ?2. Flat foot (pes planus) (acquired), left foot  M21.42   ?  ?3. Flat foot (pes planus) (acquired), right foot  M21.41   ?  ?4. Capsulitis of ankle, right  M77.51   ?  ?5. Left foot drop  M21.372   ?  ?6. Gait instability  R26.81   ?  ? ? ?Change in Status:   None ? ?ACTIONS PERFORMED: ?In-Person Delivery, patient was fit with: ?- 1x pair A5500 PDAC approved prefabricated Diabetic Shoes: Apex [id number] 10.5XW ?- 3x pair K1601 PDAC approved vacuum formed custom diabetic insoles; [place] - fabricated as part of AFOs ? ?Shoes and insoles were verified for structural integrity and safety. Patient wore shoes and insoles in office. Skin was inspected and free of areas of concern after wearing shoes and inserts. Shoes and inserts fit properly. Patient / Caregiver provided with ferbal instruction and demonstration regarding donning, doffing, wear, care, proper fit, function, purpose, cleaning, and use of shoes and insoles ' and in all related precautions and risks and benefits regarding shoes and insoles. Patient / Caregiver was instructed to wear properly fitting socks with shoes at all times. Patient was also provided with verbal instruction regarding how to report any failures or malfunctions of shoes or inserts, and necessary follow up care. Patient / Caregiver was also instructed to contact physician regarding change in status that may affect function of shoes and inserts.  ? ?Patient / Caregiver verbalized undersatnding of instruction provided. Patient / Caregiver demonstrated independence with proper donning and doffing of shoes and inserts. ? ?PLAN ?Patient to follow up as needed. Plan of care was discussed with and  agreed upon by patient and/or caregiver. All questions were answered and concerns addressed. ? ?

## 2021-12-14 NOTE — Progress Notes (Signed)
SITUATION ?Reason for Visit: Dispensation and Fitting of Custom Molded Gauntlet ?Patient Report: Patient reports comfort in ambulation and understands all instructions. ? ?OBJECTIVE DATA ?Patient History / Diagnosis:   ?  ICD-10-CM   ?1. 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 L84   ?  ?2. Flat foot (pes planus) (acquired), left foot  M21.42   ?  ?3. Flat foot (pes planus) (acquired), right foot  M21.41   ?  ?4. Capsulitis of ankle, right  M77.51   ?  ?5. Left foot drop  M21.372   ?  ?6. Gait instability  R26.81   ?  ? ? ?Provided Device:  Custom Molded Gauntlet: Downs full foot ? ?Goals of Orthosis: ?- Improve gait ?- Decrease energy expenditure during the gait cycle ?- Improve balance ?- Stabilize motion at ankle and subtalar joint ?- Compensate for muscle weakness ?- Facilitate motion ?- Provide triplanar ankle and foot stabilization for weight bearing activities ? ?Device Justification: ?- Patient is ambulatory  ?- Device is medically necessary as part of the overall treatment due to the patient's condition and related symptoms ?- It is anticipated that the patient will benefit functionally with use of the device.  ?- The custom device is utilized in an attempt to avoid the need for surgery and because a prefabricated device is inappropriate. ? ?Upon gait analysis, the device appeared to be fitting well and the patient states that the device is comfortable. ? ?ACTIONS PERFORMED ?Patient was fit with Dance movement psychotherapist. Patient tolerated fitting procedure. Fit of the device is good. Patient was able to apply properly and ambulate without distress. Device function is to restrict and limit motion and provide stabilization in the ankle joint.  ? ?Goals and function of this device were explained in detail to the patient. The patient was shown how to properly apply, wear, and care for the device. It was explained that the device will fit and function best in an  adjustable-closure shoe with a firm heel counter and a wide base of support. When the device was dispensed, it was suitable for the patient's condition and not substandard. No guarantees were given. Precautions were reviewed.  ? ?Written instructions, warranty information, and a copy of DMEPOS Supplier Standards were provided. All questions answered and concerns addressed. ? ?PLAN ?Patient is to follow up in one week or as necessary (PRN). Plan of care was discussed with and agreed upon by patient. ? ? ?

## 2021-12-15 DIAGNOSIS — E119 Type 2 diabetes mellitus without complications: Secondary | ICD-10-CM | POA: Diagnosis not present

## 2021-12-20 ENCOUNTER — Telehealth: Payer: Self-pay | Admitting: *Deleted

## 2021-12-20 ENCOUNTER — Ambulatory Visit: Payer: Medicare PPO | Admitting: Family Medicine

## 2021-12-20 NOTE — Telephone Encounter (Signed)
Called and lvm asking if pt was coming in today. Asked her to return call to confirm appt.  ?

## 2021-12-27 DIAGNOSIS — F03A18 Unspecified dementia, mild, with other behavioral disturbance: Secondary | ICD-10-CM | POA: Diagnosis not present

## 2021-12-27 DIAGNOSIS — Z7189 Other specified counseling: Secondary | ICD-10-CM | POA: Diagnosis not present

## 2021-12-28 ENCOUNTER — Ambulatory Visit: Payer: Medicare PPO | Admitting: Psychiatry

## 2021-12-28 ENCOUNTER — Encounter: Payer: Self-pay | Admitting: Psychiatry

## 2021-12-28 VITALS — BP 130/70 | HR 69 | Ht 67.0 in | Wt 260.0 lb

## 2021-12-28 DIAGNOSIS — R413 Other amnesia: Secondary | ICD-10-CM | POA: Diagnosis not present

## 2021-12-28 DIAGNOSIS — G309 Alzheimer's disease, unspecified: Secondary | ICD-10-CM | POA: Diagnosis not present

## 2021-12-28 DIAGNOSIS — F02A3 Dementia in other diseases classified elsewhere, mild, with mood disturbance: Secondary | ICD-10-CM | POA: Diagnosis not present

## 2021-12-28 DIAGNOSIS — F32A Depression, unspecified: Secondary | ICD-10-CM

## 2021-12-28 DIAGNOSIS — R2689 Other abnormalities of gait and mobility: Secondary | ICD-10-CM

## 2021-12-28 MED ORDER — DONEPEZIL HCL 5 MG PO TABS: 5.0000 mg | ORAL_TABLET | Freq: Every day | ORAL | 3 refills | Status: AC

## 2021-12-28 NOTE — Progress Notes (Signed)
[facility]  PATIENT: Carolyn Lambert DOB: [birth year]  REFERRING CLINICIAN: Agapito Lambert, * HISTORY FROM: self, son REASON FOR VISIT: memory loss   HISTORICAL  CHIEF COMPLAINT:  Chief Complaint  Patient presents with   Memory Loss    Rm 1 with son Carolyn Lambert Pt is well and hasn't noticed in memory changes in herself. Son states she is forgetting people and names of her kids and basically "vegetate" and has no drive or motivation to do anything.     HISTORY OF PRESENT ILLNESS:  The patient presents for evaluation of memory loss which has been present over the past 8 months.  She will forget conversations she has had recently or forget recent events that have happened. Son reports she has also been having hallucinations and paranoia. She thought people were shining lights in her living room which was frightening her, so her family had to get black out curtains. She has made her son go outside to look for people in the yard. Son has had to call the police because she had locked herself in her room due to fear and paranoia.   One time she thought her deceased husband's family was in town when they live in [place]. Another time she drove to her son's house at 3 AM because it was sleeting. She stayed in his driveway and called him 29 times. He did find out she was taking mediations inappropriately and has since taken over her prescriptions. Hallucinations have improved since her son took over her medications.  Son states she has a history of severe depression and she currently has no motivation. Sometimes she will sit staring at that TV for hours at a time.  She has had multiple falls. Currently uses a walker but son remains concerned about her balance.  TBI:  No past history of TBI Stroke:  no past history of stroke Seizures:  no past history of seizures Sleep: Trouble staying asleep at night.  Mood: She has a history of depression and has had  electroshock therapy before, feels down a lot of the time. Takes Cymbalta 60 mg daily. Son states she has no motivation to do anything other than eat. Has good support from her family.  Functional status:  Patient lives with her son Cooking: no longer cooking Cleaning: son does the cleaning Shopping: son does the shopping Bathing: She is showering on her own but son worries she cannot clean herself well. Showers 2-3 times per week Toileting:  she is toileting on her own Driving: she is no longer driving Bills: not handling finances Medications: Son manages her medicines Forgetting loved ones names?: she has rarely forgotten family members names Word finding difficulty? yes  OTHER MEDICAL CONDITIONS: HTN, HLD, diabetes, depression   REVIEW OF SYSTEMS: Full 14 system review of systems performed and negative with exception of: memory loss, depressoin  ALLERGIES: Allergies  Allergen Reactions   Losartan Other (See Comments) and Hives    Cough  Cough    Gabapentin Other (See Comments)    dizziness   Lisinopril Other (See Comments)    Dry cough   Oxycodone Nausea And Vomiting   Oxycodone-Aspirin Nausea And Vomiting    HOME MEDICATIONS: Outpatient Medications Prior to Visit  Medication Sig Dispense Refill   alendronate (FOSAMAX) 70 MG tablet TAKE 1 TABLET(70 MG) BY MOUTH 1 TIME A WEEK WITH A FULL GLASS OF WATER AND ON AN EMPTY STOMACH 12 tablet 3   amLODipine (NORVASC) 10 MG tablet Take  1 tablet (10 mg total) by mouth daily. 90 tablet 0   ammonium lactate (LAC-HYDRIN) 12 % lotion Apply 1 application topically daily. After showers     atorvastatin (LIPITOR) 20 MG tablet Take 1 tablet (20 mg total) by mouth daily. 90 tablet 3   baclofen (LIORESAL) 10 MG tablet Take 1 tablet (10 mg total) by mouth 2 (two) times daily as needed for muscle spasms. TAKE 1 TABLET BY MOUTH 2  TIMES DAILY AS DIRECTED Strength: 10 mg 180 tablet 1   buPROPion (WELLBUTRIN XL) 150 MG 24 hr tablet TAKE 1 TABLET  BY MOUTH  TWICE DAILY AT 8AM AND 3PM 180 tablet 1   clobetasol (TEMOVATE) 0.05 % external solution Apply topically.     DULoxetine (CYMBALTA) 60 MG capsule Take 1 capsule (60 mg total) by mouth daily. 90 capsule 1   furosemide (LASIX) 20 MG tablet TAKE ONE TABLET BY MOUTH DAILY AS NEEDED FOR SWELLING 45 tablet 0   metFORMIN (GLUCOPHAGE-XR) 500 MG 24 hr tablet Take 1 tablet (500 mg total) by mouth daily with breakfast. 90 tablet 1   metoprolol succinate (TOPROL-XL) 25 MG 24 hr tablet Take 1 tablet (25 mg total) by mouth at bedtime. For Blood pressure 90 tablet 1   RESTASIS 0.05 % ophthalmic emulsion      Alcohol Swabs (B-D SINGLE USE SWABS REGULAR) PADS Clean test area with swab before testing. (Patient not taking: Reported on 12/28/2021) 1 each 3   AMBULATORY NON FORMULARY MEDICATION Dx DM E11.9 - DM shoes - Fax to [phone] (Patient not taking: Reported on 12/28/2021) 1 each 0   aspirin EC 81 MG tablet Take 81 mg by mouth daily. (Patient not taking: Reported on 12/28/2021)     Betamethasone Valerate 0.12 % foam Apply 1 application topically 3 (three) times a week. (Patient not taking: Reported on 12/28/2021) 300 g 3   OXYGEN Inhale 2 L/min into the lungs at bedtime. O2 concentrator (Patient not taking: Reported on 10/21/2021)     Prodigy Twist Top Lancets 28G MISC Use as directed. (Patient not taking: Reported on 12/28/2021) 1 each 3   No facility-administered medications prior to visit.    PAST MEDICAL HISTORY: Past Medical History:  Diagnosis Date   Anxiety and depression    Arthritis    Chronic fatigue syndrome    Diabetes mellitus    DVT (deep venous thrombosis) (HCC)    s/p knee replacmet.    Fibromyalgia    GERD (gastroesophageal reflux disease)    Hypercholesteremia    Hypertension    Major depressive disorder 03/20/2012   Obesity    Ovarian cancer (HCC)    Peripheral edema    Sinusitis July 2013   Sleep apnea    O2 concentrator at night    PAST SURGICAL HISTORY: Past  Surgical History:  Procedure Laterality Date   ABDOMINAL HYSTERECTOMY     CATARACT EXTRACTION W/ INTRAOCULAR LENS & ANTERIOR VITRECTOMY Left 11/02/26   CATARACT EXTRACTION W/ INTRAOCULAR LENS IMPLANT Right 09/28/2016   LUMBAR DISC SURGERY     x 2   ORIF ANKLE FRACTURE Right 11/13/2018   [facility]   REPLACEMENT TOTAL KNEE Bilateral    2 on left and 1 on right, Dr Lequita Halt   ROTATOR CUFF REPAIR Right    TONSILLECTOMY     VEIN LIGATION AND STRIPPING      FAMILY HISTORY: Family History  Problem Relation Age of Onset   Hypertension Mother    Atrial fibrillation Mother  Diabetes Mother    Hyperlipidemia Mother    Diabetes type II Father    Lung cancer Father    AAA (abdominal aortic aneurysm) Father    Depression Daughter    Diabetes Daughter    Diabetes Son    Depression Son    Depression Son    Depression Son    Colon cancer Neg Hx     SOCIAL HISTORY: Social History   Socioeconomic History   Marital status: Married    Spouse name: Not on file   Number of children: 5   Years of education: 12   Highest education level: GED or equivalent  Occupational History   Occupation: retired  Tobacco Use   Smoking status: Never   Smokeless tobacco: Never  Building services engineer Use: Never used  Substance and Sexual Activity   Alcohol use: Yes    Comment: Occasionally, wine   Drug use: No    Comment: Caffeine: Coffee -2-3  cups per day.    Sexual activity: Not Currently  Other Topics Concern   Not on file  Social History Narrative   Lives alone but her sister does live close by. She enjoys watching t.v. in her free time.   Social Determinants of Health   Financial Resource Strain: Low Risk    Difficulty of Paying Living Expenses: Not hard at all  Food Insecurity: No Food Insecurity   Worried About Programme researcher, broadcasting/film/video in the Last Year: Never true   Ran Out of Food in the Last Year: Never true  Transportation Needs: No Transportation Needs   Lack of Transportation  (Medical): No   Lack of Transportation (Non-Medical): No  Physical Activity: Inactive   Days of Exercise per Week: 0 days   Minutes of Exercise per Session: 0 min  Stress: Stress Concern Present   Feeling of Stress : To some extent  Social Connections: Socially Isolated   Frequency of Communication with Friends and Family: More than three times a week   Frequency of Social Gatherings with Friends and Family: Twice a week   Attends Religious Services: Never   Database administrator or Organizations: No   Attends Banker Meetings: Never   Marital Status: Widowed  Catering manager Violence: Not At Risk   Fear of Current or Ex-Partner: No   Emotionally Abused: No   Physically Abused: No   Sexually Abused: No     PHYSICAL EXAM  GENERAL EXAM/CONSTITUTIONAL: Vitals:  Vitals:   12/28/21 1435  BP: 130/70  Pulse: 69  Weight: 260 lb (117.9 kg)  Height: 5\' 7"  (1.702 m)   Body mass index is 40.72 kg/m. Wt Readings from Last 3 Encounters:  12/28/21 260 lb (117.9 kg)  10/21/21 254 lb (115.2 kg)  08/26/21 246 lb (111.6 kg)   Patient is in no distress; well developed, nourished and groomed; neck is supple  CARDIOVASCULAR: Examination of carotid arteries is normal; no carotid bruits Regular rate and rhythm, no murmurs Examination of peripheral vascular system by observation and palpation is normal   MUSCULOSKELETAL: Gait, strength, tone, movements noted in Neurologic exam below  NEUROLOGIC: Montreal Cognitive Assessment  12/28/2021  Visuospatial/ Executive (0/5) 0  Naming (0/3) 3  Attention: Read list of digits (0/2) 1  Attention: Read list of letters (0/1) 1  Attention: Serial 7 subtraction starting at 100 (0/3) 3  Language: Repeat phrase (0/2) 2  Language : Fluency (0/1) 0  Abstraction (0/2) 2  Delayed Recall (0/5) 2  Orientation (0/6) 6  Total 20   MOCA 11/04/21 was 15/30  CRANIAL NERVE:  2nd, 3rd, 4th, 6th - pupils equal and reactive to light, visual  fields full to confrontation, extraocular muscles intact, no nystagmus 5th - facial sensation symmetric 7th - facial strength symmetric 8th - hearing intact 9th - palate elevates symmetrically, uvula midline 11th - shoulder shrug symmetric 12th - tongue protrusion midline  MOTOR:  normal bulk and tone, no cogwheeling, full strength in the BUE, BLE  SENSORY:  normal and symmetric to light touch all 4 extremities  COORDINATION:  finger-nose-finger, fine finger movements normal, mild action tremor LUE  REFLEXES:  deep tendon reflexes present and symmetric  GAIT/STATION:  Walks with walker     DIAGNOSTIC DATA (LABS, IMAGING, TESTING) - I reviewed patient records, labs, notes, testing and imaging myself where available.  Lab Results  Component Value Date   WBC 9.6 08/26/2021   HGB 13.5 08/26/2021   HCT 41.7 08/26/2021   MCV 94.1 08/26/2021   PLT 239 08/26/2021      Component Value Date/Time   NA 141 08/26/2021 0000   NA 141 11/13/2018 0000   K 5.3 08/26/2021 0000   CL 103 08/26/2021 0000   CO2 27 08/26/2021 0000   GLUCOSE 218 (H) 08/26/2021 0000   BUN 36 (H) 08/26/2021 0000   BUN 28 (A) 11/13/2018 0000   CREATININE 2.56 (H) 08/26/2021 0000   CALCIUM 9.3 08/26/2021 0000   PROT 6.2 08/26/2021 0000   ALBUMIN 4.0 05/08/2017 0817   AST 12 08/26/2021 0000   ALT 12 08/26/2021 0000   ALKPHOS 81 05/08/2017 0817   BILITOT 0.4 08/26/2021 0000   GFRNONAA 31 (L) 03/22/2021 0000   GFRAA 35 (L) 03/22/2021 0000   Lab Results  Component Value Date   CHOL 148 08/26/2021   HDL 53 08/26/2021   LDLCALC 72 08/26/2021   LDLDIRECT 124.0 04/16/2013   TRIG 151 (H) 08/26/2021   CHOLHDL 2.8 08/26/2021   Lab Results  Component Value Date   HGBA1C 5.7 (A) 06/22/2021   Lab Results  Component Value Date   VITAMINB12 241 02/08/2016   Lab Results  Component Value Date   TSH 3.20 08/26/2021     ASSESSMENT AND PLAN  81 y.o. year old female with a history of HTN, HLD,  diabetes, depression who presents for evaluation of memory loss over the past 8 months. Her paranoia and hallucinations were likely due to polypharmacy, which has improved since her son has taken over her medications. However her ADLs remain significantly impacted due to her impaired memory despite these medication changes. Will order MRI brain to assess for signs of neurodegeneration and/or significant vascular disease. Will start donepezil for her memory. She continues to struggle with significant depression on Cymbalta. Referral to Psychiatry placed to help manage her depression medications.   1. Memory loss   2. Imbalance   3. Depression, unspecified depression type       PLAN: - MRI brain - Start donepezil 5 mg daily x4 weeks, then increase to 10 mg daily - Referral to Psychiatry for depression medication management - Referral to PT for balance training  Orders Placed This Encounter  Procedures   MR BRAIN WO CONTRAST   Ambulatory referral to Physical Therapy   Ambulatory referral to Psychiatry    Meds ordered this encounter  Medications   donepezil (ARICEPT) 5 MG tablet    Sig: Take 1 tablet (5 mg total) by mouth at bedtime. Take 1 pill  daily for 4 weeks, then increase to 2 pills daily    Dispense:  60 tablet    Refill:  3    Return in about 6 months (around 06/30/2022).  I spent an average of 48 minutes chart reviewing and counseling the patient, with at least 50% of the time face to face with the patient. General brain health measures discussed, including the importance of regular aerobic exercise. Reviewed safety measures including driving safety.   Ocie Doyne, MD 12/28/21 4:00 PM  [facility] [address] [place], [place] [address] [phone]

## 2021-12-28 NOTE — Patient Instructions (Addendum)
Plan: ?Brain MRI  ?Physical therapy for balance ?Referral to Psychiatry for depression management ? ?Donepezil ?We recommended that you take Donepezil (Aricept) '5mg'$  tab once a day for the first 4 weeks, then increase the dose to '10mg'$  once per day. It is better to take Donepezil with breakfast or lunch. Aricept is well tolerated, although some people may experience nausea, diarrhea, not sleeping well, vomiting, muscle cramps, feeling tired, or not wanting to eat. These side effects were usually mild and temporary. If symptoms continue or become severe, you should stop the medication and contact us.         ? ? ?DEMENTIA OVERVIEW ?"Dementia" is a general term for when a person has developed difficulties with reasoning, judgment, and memory. People who have dementia usually have some memory loss as well as difficulty in at least one other area, such as: ??Speaking or writing coherently (or understanding what is said or written) ??Recognizing familiar surroundings ??Planning and carrying out complex or multi-step tasks ?In order to be considered dementia these issues must be severe enough to interfere with a person's independence and daily activities. ?Dementia can be caused by several diseases that affect the brain. The most common cause is Alzheimer disease. Alzheimer disease is present in approximately 90 to 27 percent of all cases of dementia; other degenerative and/or vascular diseases may be present as well, particularly as a person gets older.  ? ?DEMENTIA RISK FACTORS ?There is no way to predict with certainty who will develop dementia. Each form of dementia has its own risk factors, but most forms have several risk factors in common. ?Age -- The biggest risk factor for dementia is age: dementia is rare in people younger than 64 years and becomes very common in people older than 73. For example, dementia affects approximately one in six people between 101 and 16 years old, one in three above 40 years, and almost  half of people over age 73. ? ?Family history -- Some forms of dementia have a genetic component, meaning that they tend to run in families. Having a close family member with Alzheimer disease increases your chances of developing it. People with a first-degree relative (parent or sibling) with Alzheimer disease have a greater chance of developing the disorder. The risk is probably highest if the family member developed Alzheimer disease at a younger age (less than 15 years old) and is lower if the family member did not get Alzheimer disease until late in life. However, families that have a very strong genetic tendency toward Alzheimer disease are uncommon. ? ?Other factors -- Studies indicate that high blood pressure, smoking, and diabetes may be risk factors for dementia. Experts are still not sure how treatment for these problems might influence your risk of developing dementia beyond their benefit of reducing stroke risk. ?Lifestyle factors have also been implicated in dementia. For instance, people who remain physically active, socially connected, and mentally engaged seem less likely to develop dementia than people who do not. These activities may produce more cognitive (mental) reserve or resilience, delaying the emergence of symptoms until an older age. ? ?DEMENTIA SYMPTOMS ?Each form of dementia can cause difficulty with memory, language, reasoning, and judgment, but the symptoms are often very different from person to person. Symptoms also change over time. ?The differences between one form of dementia and another may only be recognizable to skilled health care providers who have experience working with people with dementia. Sometimes family members notice changes but mistakenly attribute them to aging. ? ?  Is memory loss normal? -- Many people worry that memory problems are related to early Alzheimer disease. However, some problems are normal and just related to aging, and do not signify a progressive  dementia. Normal age-related changes often cause minor difficulties with immediate memory, for example, remembering a phone number or a set of directions for a short time. Temporary difficulty recalling proper names, even very familiar ones, is also common with aging. As people age normally, it is common to complain of less efficient and slower processing and learning of new information. Memory changes due to normal aging are usually mild and do not worsen greatly over time, nor should they interfere with a person's day-to-day functioning. ? ?Early changes -- The earliest symptoms of Alzheimer disease are gradual and often subtle. Many people and their families first notice difficulty recalling recent events or information. This often emerges as a tendency to repeat stories or questions or to request or require repetition of material to be able to remember. If you find yourself telling an older family member or friend "I told you that earlier" or "You have told me that more than once," you might begin to suspect Alzheimer disease. Other changes can include one or more of the following: ??Difficulties with language (eg, not being able to find the right words for things) ??Difficulty with concentration and reasoning ??Problems with complex tasks like paying bills, cooking, or balancing a checkbook ??Getting lost in a familiar place ? ?Late changes -- As Alzheimer disease progresses, a person's ability to think clearly continues to decline, and any or all of the changes listed above may be more disruptive. In addition, personality and behavioral symptoms can become quite troublesome. These can include: ??Increased anger or hostility, sometimes aggressive behavior; alternatively, some people become depressed or exhibit little interest in their surroundings (called "apathy") ??Sleep problems ??Hallucinations and/or delusions ??Disorientation ??Needing help with basic tasks (such as eating, bathing, and  dressing) ??Incontinence (difficulty controlling the bladder and/or bowels) ? ?The number of symptoms, the functions that are impaired, and the speed with which symptoms progress can vary widely from one person to the next. In some people, severe dementia occurs within five years of the diagnosis; for others, the progression can take more than 10 years. Most people with Alzheimer disease do not die from the disease itself, but rather from a secondary illness such as pneumonia, bladder infection, or complications of a fall. ? ?DEMENTIA DIAGNOSIS ?To diagnose dementia and identify the type of dementia, health care providers typically rely on the information they can gather by interacting with the person and speaking with their family members. The provider will typically perform memory and other cognitive (thinking) tests to assess the person's degree of difficulty with different types of problems. The results of these tests can then be monitored over time to observe whether functioning stays the same or declines. ? ?Blood tests are usually done to find out if a chemical or hormonal imbalance or vitamin deficiency is contributing to the person's difficulties. Brain scans (usually MRI) are often performed in people with dementia to look for other problems. Sometimes the MRI can also help health care providers identify the type of dementia, since different types can have characteristic brain changes. ? ?SAFETY AND LIFESTYLE ISSUES FOR PEOPLE WITH DEMENTIA ?A major issue for caregivers is making sure the person with dementia stays safe. Because many people with dementia do not realize that their mental functioning is impaired, they try to continue their day-to-day activities as usual.  This can lead to physical danger, and caregivers must help to avoid situations that can threaten the safety of the person or others. ?The following information applies specifically to people with Alzheimer disease, but much of it is also  relevant to people with other forms of dementia. ? ?Medications -- People with Alzheimer disease often have trouble remembering to take medications they are prescribed for other conditions, or they become confused about which

## 2021-12-29 ENCOUNTER — Telehealth: Payer: Self-pay | Admitting: Psychiatry

## 2021-12-29 DIAGNOSIS — Z205 Contact with and (suspected) exposure to viral hepatitis: Secondary | ICD-10-CM | POA: Diagnosis not present

## 2021-12-29 DIAGNOSIS — R748 Abnormal levels of other serum enzymes: Secondary | ICD-10-CM | POA: Diagnosis not present

## 2021-12-29 DIAGNOSIS — N289 Disorder of kidney and ureter, unspecified: Secondary | ICD-10-CM | POA: Diagnosis not present

## 2021-12-29 DIAGNOSIS — Z1159 Encounter for screening for other viral diseases: Secondary | ICD-10-CM | POA: Diagnosis not present

## 2021-12-29 NOTE — Telephone Encounter (Signed)
Referral sent to [facility] [phone]. ?

## 2021-12-30 ENCOUNTER — Encounter (HOSPITAL_COMMUNITY): Payer: Self-pay | Admitting: *Deleted

## 2021-12-30 ENCOUNTER — Emergency Department (HOSPITAL_COMMUNITY)
Admission: EM | Admit: 2021-12-30 | Discharge: 2021-12-30 | Disposition: A | Discharge status: Home / Self Care | Payer: Medicare PPO | Attending: Emergency Medicine | Admitting: Emergency Medicine

## 2021-12-30 ENCOUNTER — Telehealth: Payer: Self-pay | Admitting: Psychiatry

## 2021-12-30 ENCOUNTER — Other Ambulatory Visit: Payer: Self-pay | Admitting: Psychiatry

## 2021-12-30 ENCOUNTER — Other Ambulatory Visit: Payer: Self-pay

## 2021-12-30 DIAGNOSIS — Z79899 Other long term (current) drug therapy: Secondary | ICD-10-CM | POA: Insufficient documentation

## 2021-12-30 DIAGNOSIS — Z7984 Long term (current) use of oral hypoglycemic drugs: Secondary | ICD-10-CM | POA: Diagnosis not present

## 2021-12-30 DIAGNOSIS — E119 Type 2 diabetes mellitus without complications: Secondary | ICD-10-CM | POA: Insufficient documentation

## 2021-12-30 DIAGNOSIS — R799 Abnormal finding of blood chemistry, unspecified: Secondary | ICD-10-CM | POA: Diagnosis not present

## 2021-12-30 DIAGNOSIS — R7989 Other specified abnormal findings of blood chemistry: Secondary | ICD-10-CM | POA: Insufficient documentation

## 2021-12-30 DIAGNOSIS — R413 Other amnesia: Secondary | ICD-10-CM

## 2021-12-30 DIAGNOSIS — F32A Depression, unspecified: Secondary | ICD-10-CM

## 2021-12-30 DIAGNOSIS — Z8543 Personal history of malignant neoplasm of ovary: Secondary | ICD-10-CM | POA: Insufficient documentation

## 2021-12-30 DIAGNOSIS — R899 Unspecified abnormal finding in specimens from other organs, systems and tissues: Secondary | ICD-10-CM

## 2021-12-30 DIAGNOSIS — I10 Essential (primary) hypertension: Secondary | ICD-10-CM | POA: Diagnosis not present

## 2021-12-30 LAB — CBC WITH DIFFERENTIAL/PLATELET
Abs Immature Granulocytes: 0.02 10*3/uL (ref 0.00–0.07)
Basophils Absolute: 0.1 10*3/uL (ref 0.0–0.1)
Basophils Relative: 1 %
Eosinophils Absolute: 0.2 10*3/uL (ref 0.0–0.5)
Eosinophils Relative: 2 %
HCT: 40.2 % (ref 36.0–46.0)
Hemoglobin: 13 g/dL (ref 12.0–15.0)
Immature Granulocytes: 0 %
Lymphocytes Relative: 34 %
Lymphs Abs: 3 10*3/uL (ref 0.7–4.0)
MCH: 31.3 pg (ref 26.0–34.0)
MCHC: 32.3 g/dL (ref 30.0–36.0)
MCV: 96.6 fL (ref 80.0–100.0)
Monocytes Absolute: 0.6 10*3/uL (ref 0.1–1.0)
Monocytes Relative: 7 %
Neutro Abs: 5.1 10*3/uL (ref 1.7–7.7)
Neutrophils Relative %: 56 %
Platelets: 245 10*3/uL (ref 150–400)
RBC: 4.16 MIL/uL (ref 3.87–5.11)
RDW: 14.4 % (ref 11.5–15.5)
WBC: 8.9 10*3/uL (ref 4.0–10.5)
nRBC: 0 % (ref 0.0–0.2)

## 2021-12-30 LAB — URINALYSIS, ROUTINE W REFLEX MICROSCOPIC
Bilirubin Urine: NEGATIVE
Glucose, UA: NEGATIVE mg/dL
Hgb urine dipstick: NEGATIVE
Ketones, ur: NEGATIVE mg/dL
Nitrite: NEGATIVE
Protein, ur: NEGATIVE mg/dL
Specific Gravity, Urine: 1.013 (ref 1.005–1.030)
pH: 6 (ref 5.0–8.0)

## 2021-12-30 LAB — COMPREHENSIVE METABOLIC PANEL
ALT: 113 U/L — ABNORMAL HIGH (ref 0–44)
AST: 61 U/L — ABNORMAL HIGH (ref 15–41)
Albumin: 3.8 g/dL (ref 3.5–5.0)
Alkaline Phosphatase: 70 U/L (ref 38–126)
Anion gap: 11 (ref 5–15)
BUN: 54 mg/dL — ABNORMAL HIGH (ref 8–23)
CO2: 26 mmol/L (ref 22–32)
Calcium: 8.9 mg/dL (ref 8.9–10.3)
Chloride: 103 mmol/L (ref 98–111)
Creatinine, Ser: 1.92 mg/dL — ABNORMAL HIGH (ref 0.44–1.00)
GFR, Estimated: 26 mL/min — ABNORMAL LOW (ref >60)
Glucose, Bld: 87 mg/dL (ref 70–99)
Potassium: 4.5 mmol/L (ref 3.5–5.1)
Sodium: 140 mmol/L (ref 135–145)
Total Bilirubin: 0.6 mg/dL (ref 0.3–1.2)
Total Protein: 6.8 g/dL (ref 6.5–8.1)

## 2021-12-30 LAB — CBG MONITORING, ED: Glucose-Capillary: 96 mg/dL (ref 70–99)

## 2021-12-30 MED ORDER — SODIUM CHLORIDE 0.9 % IV BOLUS
1000.0000 mL | Freq: Once | INTRAVENOUS | Status: AC
  Administered 2021-12-30: 1000 mL via INTRAVENOUS

## 2021-12-30 NOTE — Telephone Encounter (Signed)
Referral sent to [facility] [phone]. ?

## 2021-12-30 NOTE — ED Triage Notes (Signed)
States she was advised by her PCP to come in for evaluation of abnormal labs drawn yesterday ?

## 2021-12-30 NOTE — Telephone Encounter (Signed)
Mcarthur Rossetti Josem Kaufmann: [phone] (exp. 12/30/21 to 01/29/22) order sent to GI, they will reach out to the patient to schedule.  ?

## 2021-12-30 NOTE — Telephone Encounter (Signed)
Order for Psychiatry referral to [place] placed. I accidentally placed a neuropsych referral at first, you can cancel that referral.

## 2021-12-30 NOTE — ED Provider Notes (Signed)
?[facility] ?Provider Note ? ? ?CSN: [id number] ?Arrival date & time: 12/30/21  1259 ? ?  ? ?History ?HLD, HTN, Obesity, DM2, hx ovarian cancer ?Chief Complaint  ?Patient presents with  ? Abnormal Lab  ? ? ?Carolyn Lambert is a 81 y.o. female. ?Patient presents emergency department with a chief complaint of an abnormal lab.  She was seen by her PCP yesterday who called her today and stated that she needed to come to the emergency department for IV fluids and evaluation of an elevated creatinine.  Patient denies any new symptoms.  She denies any chest pain, shortness of breath, weakness, numbness, tingling, abdominal pain, nausea, vomiting, diarrhea, fevers, chills. ? ? ?Abnormal Lab ? ?  ? ?Home Medications ?Prior to Admission medications   ?Medication Sig Start Date End Date Taking? Authorizing Provider  ?alendronate (FOSAMAX) 70 MG tablet TAKE 1 TABLET(70 MG) BY MOUTH 1 TIME A WEEK WITH A FULL GLASS OF WATER AND ON AN EMPTY STOMACH 11/10/21  Yes Hali Marry, MD  ?amLODipine (NORVASC) 10 MG tablet Take 1 tablet (10 mg total) by mouth daily. ?Patient taking differently: Take 10 mg by mouth every evening. 11/10/21  Yes Hali Marry, MD  ?ammonium lactate (LAC-HYDRIN) 12 % lotion Apply 1 application topically daily. After showers 04/11/20  Yes [provider]  ?atorvastatin (LIPITOR) 20 MG tablet Take 1 tablet (20 mg total) by mouth daily. 11/10/21  Yes Hali Marry, MD  ?baclofen (LIORESAL) 10 MG tablet Take 1 tablet (10 mg total) by mouth 2 (two) times daily as needed for muscle spasms. TAKE 1 TABLET BY MOUTH 2  TIMES DAILY AS DIRECTED Strength: 10 mg 08/26/21  Yes Hali Marry, MD  ?Betamethasone Valerate 0.12 % foam Apply 1 application topically 3 (three) times a week. 11/10/21  Yes Hali Marry, MD  ?buPROPion (WELLBUTRIN XL) 150 MG 24 hr tablet TAKE 1 TABLET BY MOUTH  TWICE DAILY AT 8AM AND 3PM ?Patient taking differently: 300 mg daily.  11/10/21  Yes Hali Marry, MD  ?DULoxetine (CYMBALTA) 60 MG capsule Take 1 capsule (60 mg total) by mouth daily. 11/10/21  Yes Hali Marry, MD  ?glipiZIDE (GLUCOTROL XL) 10 MG 24 hr tablet Take 10 mg by mouth daily. 12/14/21  Yes [provider]  ?hydrochlorothiazide (HYDRODIURIL) 25 MG tablet Take 25 mg by mouth daily. 12/14/21  Yes [provider]  ?ketoconazole (NIZORAL) 2 % shampoo Apply topically 3 (three) times a week. 12/03/21  Yes [provider]  ?Melatonin 10 MG CAPS Take 1 capsule by mouth every evening.   Yes [provider]  ?metoprolol succinate (TOPROL-XL) 25 MG 24 hr tablet Take 1 tablet (25 mg total) by mouth at bedtime. For Blood pressure 11/10/21  Yes Hali Marry, MD  ?Multiple Vitamins-Minerals (PRESERVISION AREDS 2 PO) Take 1 capsule by mouth daily.   Yes [provider]  ?OXYGEN Inhale 2 L/min into the lungs at bedtime. O2 concentrator   Yes [provider]  ?RESTASIS 0.05 % ophthalmic emulsion  06/19/20  Yes Laurence Aly, OD  ?vitamin B-12 (CYANOCOBALAMIN) 1000 MCG tablet Take 1,000 mcg by mouth daily.   Yes [provider]  ?Alcohol Swabs (B-D SINGLE USE SWABS REGULAR) PADS Clean test area with swab before testing. ?Patient not taking: Reported on 12/28/2021 01/31/20   Hali Marry, MD  ?AMBULATORY NON FORMULARY MEDICATION Dx DM E11.9 - DM shoes - Fax to [phone] ?Patient not taking: Reported on 12/28/2021 01/26/21  Hali Marry, MD  ?clobetasol (TEMOVATE) 0.05 % external solution Apply topically. 09/08/21   [provider]  ?donepezil (ARICEPT) 5 MG tablet Take 1 tablet (5 mg total) by mouth at bedtime. Take 1 pill daily for 4 weeks, then increase to 2 pills daily ?Patient not taking: Reported on 12/30/2021 12/28/21   Genia Harold, MD  ?furosemide (LASIX) 20 MG tablet TAKE ONE TABLET BY MOUTH DAILY AS NEEDED FOR SWELLING ?Patient not taking: Reported on 12/30/2021 11/10/21   Hali Marry, MD  ?JANUVIA 50 MG tablet Take 50 mg by mouth daily. ?Patient not taking: Reported on 12/30/2021 09/23/21   [provider]  ?metFORMIN (GLUCOPHAGE-XR) 500 MG 24 hr tablet Take 1 tablet (500 mg total) by mouth daily with breakfast. ?Patient not taking: Reported on 12/30/2021 11/10/21   Hali Marry, MD  ?Prodigy Twist Top Lancets 28G MISC Use as directed. ?Patient not taking: Reported on 12/28/2021 01/31/20   Hali Marry, MD  ?   ? ?Allergies    ?Losartan, Gabapentin, Lisinopril, Oxycodone, and Oxycodone-aspirin   ? ?Review of Systems   ?Review of Systems  ?All other systems reviewed and are negative. ? ?Physical Exam ?Updated Vital Signs ?BP (!) 140/57 (BP Location: Left Wrist)   Pulse 63   Temp 97.7 ?F (36.5 ?C) (Oral)   Resp 18   Ht '5\' 7"'$  (1.702 m)   Wt 117.9 kg   SpO2 98%   BMI 40.72 kg/m?  ?Physical Exam ?Vitals and nursing note reviewed.  ?Constitutional:   ?   General: She is not in acute distress. ?   Appearance: Normal appearance. She is not ill-appearing, toxic-appearing or diaphoretic.  ?HENT:  ?   Head: Normocephalic and atraumatic.  ?   Nose: No nasal deformity.  ?   Mouth/Throat:  ?   Lips: Pink. No lesions.  ?   Mouth: Mucous membranes are moist. No injury, lacerations, oral lesions or angioedema.  ?   Pharynx: Oropharynx is clear. Uvula midline. No pharyngeal swelling, oropharyngeal exudate, posterior oropharyngeal erythema or uvula swelling.  ?Eyes:  ?   General: Gaze aligned appropriately. No scleral icterus.    ?   Right eye: No discharge.     ?   Left eye: No discharge.  ?   Conjunctiva/sclera: Conjunctivae normal.  ?   Right eye: Right conjunctiva is not injected. No exudate or hemorrhage. ?   Left eye: Left conjunctiva is not injected. No exudate or hemorrhage. ?Cardiovascular:  ?   Rate and Rhythm: Normal rate and regular rhythm.  ?   Pulses: Normal pulses.     ?     Radial pulses are 2+ on the right side and 2+ on the left side.  ?     Dorsalis pedis  pulses are 2+ on the right side and 2+ on the left side.  ?   Heart sounds: Normal heart sounds, S1 normal and S2 normal. Heart sounds not distant. No murmur heard. ?  No friction rub. No gallop. No S3 or S4 sounds.  ?Pulmonary:  ?   Effort: Pulmonary effort is normal. No accessory muscle usage or respiratory distress.  ?   Breath sounds: Normal breath sounds. No stridor. No wheezing, rhonchi or rales.  ?Chest:  ?   Chest wall: No tenderness.  ?Abdominal:  ?   General: Abdomen is flat. Bowel sounds are normal. There is no distension.  ?   Palpations: Abdomen is soft. There is no mass or pulsatile mass.  ?  Tenderness: There is no abdominal tenderness. There is no guarding or rebound.  ?Musculoskeletal:  ?   Right lower leg: No edema.  ?   Left lower leg: No edema.  ?Skin: ?   General: Skin is warm and dry.  ?   Coloration: Skin is not jaundiced or pale.  ?   Findings: No bruising, erythema, lesion or rash.  ?Neurological:  ?   General: No focal deficit present.  ?   Mental Status: She is alert and oriented to person, place, and time.  ?   GCS: GCS eye subscore is 4. GCS verbal subscore is 5. GCS motor subscore is 6.  ?Psychiatric:     ?   Mood and Affect: Mood normal.     ?   Behavior: Behavior normal. Behavior is cooperative.  ? ? ?ED Results / Procedures / Treatments   ?Labs ?(all labs ordered are listed, but only abnormal results are displayed) ?Labs Reviewed  ?COMPREHENSIVE METABOLIC PANEL - Abnormal; Notable for the following components:  ?    Result Value  ? BUN 54 (*)   ? Creatinine, Ser 1.92 (*)   ? AST 61 (*)   ? ALT 113 (*)   ? GFR, Estimated 26 (*)   ? All other components within normal limits  ?URINALYSIS, ROUTINE W REFLEX MICROSCOPIC - Abnormal; Notable for the following components:  ? Leukocytes,Ua LARGE (*)   ? Bacteria, UA RARE (*)   ? All other components within normal limits  ?CBC WITH DIFFERENTIAL/PLATELET  ?CBG MONITORING, ED  ? ? ?EKG ?None ? ?Radiology ?No results  found. ? ?Procedures ?Procedures  ? ?Medications Ordered in ED ?Medications  ?sodium chloride 0.9 % bolus 1,000 mL (0 mLs Intravenous Stopped 12/30/21 1650)  ? ? ?ED Course/ Medical Decision Making/ A&P ?  ?                        ?Medical [employer] Must

## 2021-12-30 NOTE — Discharge Instructions (Signed)
Your labs looked fairly stable from your previous labs over the past several years. I suspect that you have underlying chronic kidney disease that needs to be evaluated by your PCP. Please call and set up an appointment with them to discuss this further. I encourage plenty of fluids at home.  ?

## 2021-12-30 NOTE — Telephone Encounter (Signed)
Can another referral be put in for the pt's psychiatry, both [facility] or [facility] don't treat pt's for Psychiatry. If we could get one to [facility]. ?

## 2021-12-31 ENCOUNTER — Other Ambulatory Visit: Payer: Self-pay | Admitting: Family Medicine

## 2021-12-31 ENCOUNTER — Encounter: Payer: Self-pay | Admitting: Psychology

## 2022-01-03 DIAGNOSIS — F332 Major depressive disorder, recurrent severe without psychotic features: Secondary | ICD-10-CM | POA: Diagnosis not present

## 2022-01-09 ENCOUNTER — Ambulatory Visit
Admission: RE | Admit: 2022-01-09 | Discharge: 2022-01-09 | Disposition: A | Discharge status: Home / Self Care | Payer: Medicare PPO | Source: Ambulatory Visit | Attending: Psychiatry | Admitting: Psychiatry

## 2022-01-09 DIAGNOSIS — R413 Other amnesia: Secondary | ICD-10-CM

## 2022-01-10 ENCOUNTER — Telehealth: Payer: Self-pay | Admitting: *Deleted

## 2022-01-10 DIAGNOSIS — L989 Disorder of the skin and subcutaneous tissue, unspecified: Secondary | ICD-10-CM | POA: Diagnosis not present

## 2022-01-10 DIAGNOSIS — L219 Seborrheic dermatitis, unspecified: Secondary | ICD-10-CM | POA: Diagnosis not present

## 2022-01-10 NOTE — Telephone Encounter (Signed)
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 It was not present on her CT scan in 2017, so it occurred sometime between 2017 and now. Strokes in this area can cause dizziness and balance issues but don't typically affect the memory. She should start  taking aspirin 81 mg daily if she is not taking this already.  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 These can cause memory issues if a lot of them accumulate on the brain. Taking baby aspirin daily can help prevent more of these spots for accumulating. He repeated directions correctly for ASA, verbalized understanding, appreciation. ? ?

## 2022-01-11 NOTE — Telephone Encounter (Signed)
Contacted grandson back, informed him we spoke to pt son's yesterday and informed him of MRI and next steps. Dr Billey Gosling wants her to start aspirin and keep FU appt. He understood and was appreciative for the call back.  ?

## 2022-01-11 NOTE — Telephone Encounter (Signed)
Pt's son, Paulino Door (on [place]) would like a call from the nurse to discuss next step after having MRI. ?

## 2022-01-12 NOTE — Telephone Encounter (Signed)
Referral sent to [facility] [phone]. ?

## 2022-01-18 ENCOUNTER — Ambulatory Visit: Payer: Medicare PPO

## 2022-01-27 ENCOUNTER — Ambulatory Visit (INDEPENDENT_AMBULATORY_CARE_PROVIDER_SITE_OTHER): Payer: Medicare PPO

## 2022-01-27 ENCOUNTER — Encounter: Payer: Self-pay | Admitting: Pulmonary Disease

## 2022-01-27 ENCOUNTER — Ambulatory Visit: Payer: Medicare PPO | Admitting: Pulmonary Disease

## 2022-01-27 VITALS — BP 128/62 | HR 60 | Temp 97.9°F | Ht 67.0 in | Wt 260.0 lb

## 2022-01-27 DIAGNOSIS — R0609 Other forms of dyspnea: Secondary | ICD-10-CM

## 2022-01-27 DIAGNOSIS — R531 Weakness: Secondary | ICD-10-CM | POA: Diagnosis not present

## 2022-01-27 DIAGNOSIS — R0600 Dyspnea, unspecified: Secondary | ICD-10-CM | POA: Diagnosis not present

## 2022-01-27 NOTE — Patient Instructions (Signed)
Nice to meet you ? ?We will get a chest x-ray today and schedule pulmonary function test for when you come back for further evaluation of your symptoms.  Your lungs sound very clear.  I am optimistic they are healthy.  But these test will help [name] get more information. ? ?PFT at time of next visit, we will go over results when we meet in clinic ? ?Return to clinic in 2 months or sooner as needed ?

## 2022-01-27 NOTE — Progress Notes (Signed)
? ?'@Patient'$  ID: Carolyn Lambert, female    DOB: 09-17-1941, 81 y.o.   MRN: [id number] ? ?Chief Complaint  ?Patient presents with  ? Consult  ?  Consult for having issues with breathing. Pt states she has 2L oxygen at night but is unsure who prescribed it. Pt states the issues breathing has been occurring for many years now.   ? ? ?Referring provider: ?Heywood Bene, * ? ?HPI:  ? ?81 y.o. woman whom we are seeing in consultation for evaluation of dyspnea on exertion.  Note from PCP reviewed. ? ?Patient reports long history of dyspnea exertion.  Present for many years.  May be getting worse over time.  Present on flat surfaces a very short differences, only a few feet.  Cannot do inclines or stairs.  No times a day with things better or worse.  No other position make things better or worse.  Resting helps.  No seasonal environmental factors that make things better or worse.  No other alleviating or exacerbating factors.  She denies history of atopic symptoms, no seasonal allergies.  No history of blood clots.  She is up and moving throughout the day, ambulatory.  Only over short distances at a time.  Gilford Rile is helpful in terms of resting, can sit on it. ? ?Reviewed most recent chest x-ray in 2021 that shows clear lungs bilaterally.  No anemia on recent labs. ? ?PMH: Hypertension, fluid overload, ?Surgical history: Hysterectomy, cataract surgery, lumbar disc surgery, knee replacement ?Family history: Hypertension, A-fib, diabetes in mother, lung cancer in father ?Social history: Never smoker, lives in [place] ?Family History  ?Problem Relation Age of Onset  ? Hypertension Mother   ? Atrial fibrillation Mother   ? Diabetes Mother   ? Hyperlipidemia Mother   ? Diabetes type II Father   ? Lung cancer Father   ? AAA (abdominal aortic aneurysm) Father   ? Depression Daughter   ? Diabetes Daughter   ? Diabetes Son   ? Depression Son   ? Depression Son   ? Depression Son   ? Colon cancer Neg Hx   ? ? ?Past  Surgical History:  ?Procedure Laterality Date  ? ABDOMINAL HYSTERECTOMY    ? CATARACT EXTRACTION W/ INTRAOCULAR LENS & ANTERIOR VITRECTOMY Left 11/02/26  ? CATARACT EXTRACTION W/ INTRAOCULAR LENS IMPLANT Right 09/28/2016  ? LUMBAR DISC SURGERY    ? x 2  ? ORIF ANKLE FRACTURE Right 11/13/2018  ? [facility]  ? REPLACEMENT TOTAL KNEE Bilateral   ? 2 on left and 1 on right, Dr Wynelle Link  ? ROTATOR CUFF REPAIR Right   ? TONSILLECTOMY    ? VEIN LIGATION AND STRIPPING    ? ? ?Smoker/ Smoking History:  ?Maintenance:   ?Pt of:  ? ?01/27/2022  - Visit  ? ? ? ?Questionaires / Pulmonary Flowsheets:  ? ?ACT:  ?   ? View : No data to display.  ?  ?  ?  ? ? ?MMRC: ?   ? View : No data to display.  ?  ?  ?  ? ? ?Epworth:  ?   ? View : No data to display.  ?  ?  ?  ? ? ?Tests:  ? ?FENO:  ?No results found for: NITRICOXIDE ? ?PFT: ?   ? View : No data to display.  ?  ?  ?  ? ? ?WALK:  ?   ? View : No data to display.  ?  ?  ?  ? ? ?  Imaging: ?Personally reviewed and as per EMR discussion this note ?MR BRAIN WO CONTRAST ? ?Result Date: 01/09/2022 ? [facility] [address] [place], [place] [address] [phone] NEUROIMAGING REPORT STUDY DATE: 01/09/2022 PATIENT NAME: Carolyn Lambert DOB: [birth year] MRN: [id number] EXAM: MRI Brain without contrast ORDERING CLINICIAN: Genia Harold, MD CLINICAL HISTORY: 81 year old woman with memory loss COMPARISON FILMS: CT 09/20/2016 TECHNIQUE: MRI of the brain without contrast was obtained utilizing 5 mm axial slices with T1, T2, T2 flair, SWI and diffusion weighted views.  T1 sagittal and T2 coronal views were obtained. CONTRAST: none IMAGING SITE: [facility], [place], [place] FINDINGS: On sagittal images, the spinal cord is imaged caudally to C4 and is normal in caliber.   The contents of the posterior fossa are of normal size and position.   The pituitary gland has a reduced height with a normal sized sella turcica.  This is usually an incidental finding.   The optic chiasm appear normal.    There is mild to moderate generalized cortical atrophy with, mildly progressed compared to the CT scan from 2017.  There are no abnormal extra-axial collections of fluid.  There is a lacunar infarction involving the left middle cerebellar peduncle and adjacent cerebellum.  It was not present on the 09/20/2016 CT scan.  A couple small T2/FLAIR hyperintense foci are noted in the pons.  The deep gray matter appears normal.  In the hemispheres there are multiple T2/FLAIR hyperintense foci predominantly in the deep and subcortical white matter.  Largest confluencies are noted in the periatrial white matter.  Scattered among these are a few small white matter lacunar infarctions.  None of the foci appear to be acute.  The white matter changes have mildly progressed compared to the 09/20/2016 CT scan.  Diffusion weighted images are normal.  Susceptibility weighted images are normal.  There have been bilateral lens replacements.  Otherwise, the orbits appear normal.   The VIIth/VIIIth nerve complex appears normal.  The mastoid air cells appear normal.  The paranasal sinuses appear normal.  Flow voids are identified within the major intracerebral arteries.    ? ?This MRI of the brain without contrast shows the following: 1.  Mild to moderate generalized cortical atrophy, mildly progressed compared to the 09/20/2016 CT scan. 2.  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  This is chronic but was not present on the 2017 CT scan. 3.  Scattered T2/FLAIR hyperintense foci in the hemispheres with confluencies in the periatrial white matter.  This is most consistent with moderate chronic microvascular ischemic changes and has mildly progressed compared to the 2017 CT scan. 4.  No acute findings. INTERPRETING PHYSICIAN: Richard A. Felecia Shelling, MD, PhD, FAAN Certified in  [place] by [employer]   ? ?Lab Results: ?Personally  reviewed ?CBC ?   ?Component Value Date/Time  ? WBC 8.9 12/30/2021 1348  ? RBC 4.16 12/30/2021 1348  ? HGB 13.0 12/30/2021 1348  ? HCT 40.2 12/30/2021 1348  ? PLT 245 12/30/2021 1348  ? MCV 96.6 12/30/2021 1348  ? MCH 31.3 12/30/2021 1348  ? MCHC 32.3 12/30/2021 1348  ? RDW 14.4 12/30/2021 1348  ? LYMPHSABS 3.0 12/30/2021 1348  ? MONOABS 0.6 12/30/2021 1348  ? EOSABS 0.2 12/30/2021 1348  ? BASOSABS 0.1 12/30/2021 1348  ? ? ?BMET ?   ?Component Value Date/Time  ? NA 140 12/30/2021 1348  ? NA 141 11/13/2018 0000  ? K 4.5 12/30/2021 1348  ?  CL 103 12/30/2021 1348  ? CO2 26 12/30/2021 1348  ? GLUCOSE 87 12/30/2021 1348  ? BUN 54 (H) 12/30/2021 1348  ? BUN 28 (A) 11/13/2018 0000  ? CREATININE 1.92 (H) 12/30/2021 1348  ? CREATININE 2.56 (H) 08/26/2021 0000  ? CALCIUM 8.9 12/30/2021 1348  ? GFRNONAA 26 (L) 12/30/2021 1348  ? GFRNONAA 31 (L) 03/22/2021 0000  ? GFRAA 35 (L) 03/22/2021 0000  ? ? ?BNP ?No results found for: BNP ? ?ProBNP ?No results found for: PROBNP ? ?Specialty Problems   ? ?  ? Pulmonary Problems  ? Nocturnal hypoxemia  ? ? ?Allergies  ?Allergen Reactions  ? Losartan Other (See Comments) and Hives  ?  Cough  ?Cough   ? Gabapentin Other (See Comments)  ?  dizziness  ? Lisinopril Other (See Comments)  ?  Dry cough  ? Oxycodone Nausea And Vomiting  ? Oxycodone-Aspirin Nausea And Vomiting  ? ? ?Immunization History  ?Administered Date(s) Administered  ? Fluad Quad(high Dose 65+) 06/16/2020, 06/22/2021  ? Influenza, High Dose Seasonal PF 07/11/2016, 07/25/2017, 09/13/2018, 09/03/2019, 06/16/2020, 06/22/2021  ? Influenza,inj,Quad PF,6+ Mos 06/17/2014, 06/02/2015  ? Influenza-Unspecified 08/10/2013  ? PFIZER Comirnaty(Gray Top)Covid-19 Tri-Sucrose Vaccine 12/01/2019, 12/31/2019, 08/04/2021  ? PFIZER(Purple Top)SARS-COV-2 Vaccination 12/01/2019, 12/31/2019  ? Pneumococcal Conjugate-13 08/18/2015  ? Pneumococcal Polysaccharide-23 08/02/2008, 11/16/2018  ? Pneumococcal-Unspecified 08/02/2008, 11/16/2018  ? Td  03/26/2008  ? Tdap 06/25/2020  ? Zoster Recombinat (Shingrix) 07/29/2021  ? ? ?Past Medical History:  ?Diagnosis Date  ? Anxiety and depression   ? Arthritis   ? Chronic fatigue syndrome   ? Diabetes mellitus   ? DVT (d

## 2022-02-01 DIAGNOSIS — F332 Major depressive disorder, recurrent severe without psychotic features: Secondary | ICD-10-CM | POA: Diagnosis not present

## 2022-02-09 ENCOUNTER — Other Ambulatory Visit: Payer: Medicare PPO

## 2022-02-15 ENCOUNTER — Other Ambulatory Visit: Payer: Self-pay | Admitting: Family Medicine

## 2022-02-15 DIAGNOSIS — I10 Essential (primary) hypertension: Secondary | ICD-10-CM

## 2022-02-18 ENCOUNTER — Telehealth: Payer: Self-pay

## 2022-02-18 NOTE — Telephone Encounter (Signed)
Returning call to pt but vm was full.

## 2022-03-10 ENCOUNTER — Other Ambulatory Visit: Payer: Medicare PPO

## 2022-03-17 DIAGNOSIS — E119 Type 2 diabetes mellitus without complications: Secondary | ICD-10-CM | POA: Diagnosis not present

## 2022-03-22 DIAGNOSIS — M5116 Intervertebral disc disorders with radiculopathy, lumbar region: Secondary | ICD-10-CM | POA: Diagnosis not present

## 2022-03-22 DIAGNOSIS — M5441 Lumbago with sciatica, right side: Secondary | ICD-10-CM | POA: Diagnosis not present

## 2022-03-25 DIAGNOSIS — G3184 Mild cognitive impairment, so stated: Secondary | ICD-10-CM | POA: Diagnosis not present

## 2022-03-25 DIAGNOSIS — F332 Major depressive disorder, recurrent severe without psychotic features: Secondary | ICD-10-CM | POA: Diagnosis not present

## 2022-03-27 ENCOUNTER — Encounter (HOSPITAL_COMMUNITY): Payer: Self-pay

## 2022-03-27 ENCOUNTER — Other Ambulatory Visit: Payer: Self-pay

## 2022-03-27 DIAGNOSIS — R69 Illness, unspecified: Secondary | ICD-10-CM | POA: Diagnosis not present

## 2022-03-27 DIAGNOSIS — R5381 Other malaise: Secondary | ICD-10-CM | POA: Diagnosis not present

## 2022-03-27 DIAGNOSIS — Z7982 Long term (current) use of aspirin: Secondary | ICD-10-CM | POA: Insufficient documentation

## 2022-03-27 DIAGNOSIS — R2243 Localized swelling, mass and lump, lower limb, bilateral: Secondary | ICD-10-CM | POA: Diagnosis not present

## 2022-03-27 DIAGNOSIS — R0602 Shortness of breath: Secondary | ICD-10-CM | POA: Insufficient documentation

## 2022-03-27 DIAGNOSIS — Z79899 Other long term (current) drug therapy: Secondary | ICD-10-CM | POA: Insufficient documentation

## 2022-03-27 NOTE — ED Triage Notes (Addendum)
Pt BIB [facility]  from home with c/o SOB per son she is to be on o2 chronically but does not wear it. Per EMS she was 94% on RA, now on 4L [place].    Pt is 98% on RA in triage

## 2022-03-28 ENCOUNTER — Emergency Department (HOSPITAL_COMMUNITY)
Admission: EM | Admit: 2022-03-28 | Discharge: 2022-03-28 | Disposition: A | Discharge status: Home / Self Care | Payer: Medicare PPO | Attending: Emergency Medicine | Admitting: Emergency Medicine

## 2022-03-28 ENCOUNTER — Emergency Department (HOSPITAL_COMMUNITY): Payer: Medicare PPO

## 2022-03-28 DIAGNOSIS — R0602 Shortness of breath: Secondary | ICD-10-CM

## 2022-03-28 LAB — COMPREHENSIVE METABOLIC PANEL
ALT: 15 U/L (ref 0–44)
AST: 10 U/L — ABNORMAL LOW (ref 15–41)
Albumin: 3.6 g/dL (ref 3.5–5.0)
Alkaline Phosphatase: 79 U/L (ref 38–126)
Anion gap: 6 (ref 5–15)
BUN: 51 mg/dL — ABNORMAL HIGH (ref 8–23)
CO2: 28 mmol/L (ref 22–32)
Calcium: 8.4 mg/dL — ABNORMAL LOW (ref 8.9–10.3)
Chloride: 103 mmol/L (ref 98–111)
Creatinine, Ser: 1.7 mg/dL — ABNORMAL HIGH (ref 0.44–1.00)
GFR, Estimated: 30 mL/min — ABNORMAL LOW (ref >60)
Glucose, Bld: 266 mg/dL — ABNORMAL HIGH (ref 70–99)
Potassium: 4.7 mmol/L (ref 3.5–5.1)
Sodium: 137 mmol/L (ref 135–145)
Total Bilirubin: 0.6 mg/dL (ref 0.3–1.2)
Total Protein: 6.3 g/dL — ABNORMAL LOW (ref 6.5–8.1)

## 2022-03-28 LAB — CBC WITH DIFFERENTIAL/PLATELET
Abs Immature Granulocytes: 0.12 10*3/uL — ABNORMAL HIGH (ref 0.00–0.07)
Basophils Absolute: 0 10*3/uL (ref 0.0–0.1)
Basophils Relative: 0 %
Eosinophils Absolute: 0 10*3/uL (ref 0.0–0.5)
Eosinophils Relative: 0 %
HCT: 40.4 % (ref 36.0–46.0)
Hemoglobin: 12.8 g/dL (ref 12.0–15.0)
Immature Granulocytes: 1 %
Lymphocytes Relative: 10 %
Lymphs Abs: 1.1 10*3/uL (ref 0.7–4.0)
MCH: 29.8 pg (ref 26.0–34.0)
MCHC: 31.7 g/dL (ref 30.0–36.0)
MCV: 94.2 fL (ref 80.0–100.0)
Monocytes Absolute: 0.6 10*3/uL (ref 0.1–1.0)
Monocytes Relative: 5 %
Neutro Abs: 8.9 10*3/uL — ABNORMAL HIGH (ref 1.7–7.7)
Neutrophils Relative %: 84 %
Platelets: 194 10*3/uL (ref 150–400)
RBC: 4.29 MIL/uL (ref 3.87–5.11)
RDW: 13.3 % (ref 11.5–15.5)
WBC: 10.7 10*3/uL — ABNORMAL HIGH (ref 4.0–10.5)
nRBC: 0 % (ref 0.0–0.2)

## 2022-03-28 LAB — BRAIN NATRIURETIC PEPTIDE: B Natriuretic Peptide: 106 pg/mL — ABNORMAL HIGH (ref 0.0–100.0)

## 2022-03-28 LAB — CBG MONITORING, ED: Glucose-Capillary: 266 mg/dL — ABNORMAL HIGH (ref 70–99)

## 2022-03-28 LAB — LACTIC ACID, PLASMA: Lactic Acid, Venous: 1.1 mmol/L (ref 0.5–1.9)

## 2022-03-28 LAB — TROPONIN I (HIGH SENSITIVITY): Troponin I (High Sensitivity): 4 ng/L (ref <18)

## 2022-03-28 NOTE — ED Provider Notes (Signed)
[facility] EMERGENCY DEPARTMENT Provider Note   CSN: [id number] Arrival date & time: 03/27/22  2354     History  Chief Complaint  Patient presents with   Shortness of Breath    Carolyn Lambert is a 81 y.o. female.  Patient presents to the emergency department for evaluation of shortness of breath.  Reports that she went to bed feeling fine but woke up feeling very short of breath and called EMS.  EMS was told by her son that she is supposed to be on oxygen but she does not wear it.  She is not hypoxic during transport or at arrival.  Patient denies associated chest pain.  She has not had any fever or cough.  Legs are swollen but chronically, no larger than usual.       Home Medications Prior to Admission medications   Medication Sig Start Date End Date Taking? Authorizing Provider  alendronate (FOSAMAX) 70 MG tablet TAKE 1 TABLET(70 MG) BY MOUTH 1 TIME A WEEK WITH A FULL GLASS OF WATER AND ON AN EMPTY STOMACH 11/10/21   Hali Marry, MD  amLODipine (NORVASC) 10 MG tablet Take 1 tablet (10 mg total) by mouth daily. Patient taking differently: Take 10 mg by mouth every evening. 11/10/21   Hali Marry, MD  ammonium lactate (LAC-HYDRIN) 12 % lotion Apply 1 application topically daily. After showers 04/11/20   [provider]  aspirin 81 MG EC tablet Take by mouth.    [provider]  atorvastatin (LIPITOR) 20 MG tablet Take 1 tablet (20 mg total) by mouth daily. 11/10/21   Hali Marry, MD  baclofen (LIORESAL) 10 MG tablet Take 1 tablet (10 mg total) by mouth 2 (two) times daily as needed for muscle spasms. TAKE 1 TABLET BY MOUTH 2  TIMES DAILY AS DIRECTED Strength: 10 mg 08/26/21   Hali Marry, MD  Betamethasone Valerate 0.12 % foam Apply 1 application topically 3 (three) times a week. 11/10/21   Hali Marry, MD  buPROPion (WELLBUTRIN XL) 150 MG 24 hr tablet TAKE 1 TABLET BY MOUTH  TWICE DAILY AT 8AM AND 3PM Patient taking  differently: 300 mg daily. 11/10/21   Hali Marry, MD  clobetasol (TEMOVATE) 0.05 % external solution Apply topically. 09/08/21   [provider]  donepezil (ARICEPT) 5 MG tablet Take 1 tablet (5 mg total) by mouth at bedtime. Take 1 pill daily for 4 weeks, then increase to 2 pills daily 12/28/21   Genia Harold, MD  DULoxetine (CYMBALTA) 60 MG capsule Take 1 capsule (60 mg total) by mouth daily. 11/10/21   Hali Marry, MD  furosemide (LASIX) 20 MG tablet TAKE ONE TABLET BY MOUTH DAILY AS NEEDED FOR SWELLING 11/10/21   Hali Marry, MD  glipiZIDE (GLUCOTROL XL) 10 MG 24 hr tablet Take 10 mg by mouth daily. 12/14/21   [provider]  hydrochlorothiazide (HYDRODIURIL) 25 MG tablet Take 25 mg by mouth daily. 12/14/21   [provider]  ketoconazole (NIZORAL) 2 % shampoo WASH 3 TIMES A WEEK. LATHER, LET SIT FOR 5 MINUTES AND RINSE 12/31/21   Hali Marry, MD  Melatonin 10 MG CAPS Take 1 capsule by mouth every evening.    [provider]  metoprolol succinate (TOPROL-XL) 25 MG 24 hr tablet Take 1 tablet (25 mg total) by mouth at bedtime. For Blood pressure 11/10/21   Hali Marry, MD  Multiple Vitamins-Minerals (PRESERVISION AREDS 2 PO) Take 1 capsule by mouth  daily.    [provider]  OXYGEN Inhale 2 L/min into the lungs at bedtime. O2 concentrator    [provider]  RESTASIS 0.05 % ophthalmic emulsion  06/19/20   Laurence Aly, OD  risperiDONE (RISPERDAL) 0.5 MG tablet Take by mouth. 01/24/22   [provider]  vitamin B-12 (CYANOCOBALAMIN) 1000 MCG tablet Take 1,000 mcg by mouth daily.    [provider]      Allergies    Losartan, Gabapentin, Lisinopril, Oxycodone, and Oxycodone-aspirin    Review of Systems   Review of Systems  Physical Exam Updated Vital Signs BP (!) 143/66   Pulse (!) 51   Resp 14   Ht '5\' 7"'$  (1.702 m)   Wt 118 kg   SpO2 95%   BMI 40.74 kg/m  Physical  Exam Vitals and nursing note reviewed.  Constitutional:      General: She is not in acute distress.    Appearance: She is well-developed.  HENT:     Head: Normocephalic and atraumatic.     Mouth/Throat:     Mouth: Mucous membranes are moist.  Eyes:     General: Vision grossly intact. Gaze aligned appropriately.     Extraocular Movements: Extraocular movements intact.     Conjunctiva/sclera: Conjunctivae normal.  Cardiovascular:     Rate and Rhythm: Normal rate and regular rhythm.     Pulses: Normal pulses.     Heart sounds: Normal heart sounds, S1 normal and S2 normal. No murmur heard.    No friction rub. No gallop.  Pulmonary:     Effort: Pulmonary effort is normal. No respiratory distress.     Breath sounds: Normal breath sounds.  Abdominal:     General: Bowel sounds are normal.     Palpations: Abdomen is soft.     Tenderness: There is no abdominal tenderness. There is no guarding or rebound.     Hernia: No hernia is present.  Musculoskeletal:        General: No swelling.     Cervical back: Full passive range of motion without pain, normal range of motion and neck supple. No spinous process tenderness or muscular tenderness. Normal range of motion.     Right lower leg: Edema present.     Left lower leg: Edema present.  Skin:    General: Skin is warm and dry.     Capillary Refill: Capillary refill takes less than 2 seconds.     Findings: No ecchymosis, erythema, rash or wound.  Neurological:     General: No focal deficit present.     Mental Status: She is alert and oriented to person, place, and time.     GCS: GCS eye subscore is 4. GCS verbal subscore is 5. GCS motor subscore is 6.     Cranial Nerves: Cranial nerves 2-12 are intact.     Sensory: Sensation is intact.     Motor: Motor function is intact.     Coordination: Coordination is intact.  Psychiatric:        Attention and Perception: Attention normal.        Mood and Affect: Mood normal.        Speech: Speech  normal.        Behavior: Behavior normal.     ED Results / Procedures / Treatments   Labs (all labs ordered are listed, but only abnormal results are displayed) Labs Reviewed  CBC WITH DIFFERENTIAL/PLATELET - Abnormal; Notable for the following components:  Result Value   WBC 10.7 (*)    Neutro Abs 8.9 (*)    Abs Immature Granulocytes 0.12 (*)    All other components within normal limits  COMPREHENSIVE METABOLIC PANEL - Abnormal; Notable for the following components:   Glucose, Bld 266 (*)    BUN 51 (*)    Creatinine, Ser 1.70 (*)    Calcium 8.4 (*)    Total Protein 6.3 (*)    AST 10 (*)    GFR, Estimated 30 (*)    All other components within normal limits  BRAIN NATRIURETIC PEPTIDE - Abnormal; Notable for the following components:   B Natriuretic Peptide 106.0 (*)    All other components within normal limits  LACTIC ACID, PLASMA  TROPONIN I (HIGH SENSITIVITY)  TROPONIN I (HIGH SENSITIVITY)    EKG EKG Interpretation  Date/Time:  Monday March 28 2022 00:20:00 EDT Ventricular Rate:  55 PR Interval:  178 QRS Duration: 103 QT Interval:  431 QTC Calculation: 413 R Axis:   64 Text Interpretation: Sinus rhythm Abnormal R-wave progression, early transition Borderline ST elevation, inferior leads Confirmed by Orpah Greek ([id number]) on 03/28/2022 1:43:32 AM  Radiology DG Chest Port 1 View  Result Date: 03/28/2022 CLINICAL DATA:  Shortness of breath EXAM: PORTABLE CHEST 1 VIEW COMPARISON:  01/27/2022 FINDINGS: Shallow inspiration. Heart size and pulmonary vascularity are normal. Lungs are clear. No pleural effusions. No pneumothorax. Mediastinal contours appear intact. Calcification of the aorta. Degenerative changes in the spine and shoulders. Postoperative change in the right shoulder. IMPRESSION: Shallow inspiration.  No evidence of active pulmonary disease. Electronically Signed   By: Lucienne Capers M.D.   On: 03/28/2022 00:38    Procedures Procedures     Medications Ordered in ED Medications - No data to display  ED Course/ Medical Decision Making/ A&P                           Medical Decision Making Amount and/or Complexity of Data Reviewed Labs: ordered. Decision-making details documented in ED Course. Radiology: ordered and independent interpretation performed. Decision-making details documented in ED Course. ECG/medicine tests: ordered and independent interpretation performed. Decision-making details documented in ED Course.   Patient presents to the emergency room for shortness of breath.  Patient has a history of chronic shortness of breath requiring O2 supplementation.  She was not on her oxygen when she felt short of breath.  Patient reports waking up from sleep feeling more short of breath.  This has resolved here in the ED, she is not requiring any supplemental oxygen.  She has not had any chest pain.  Cardiac evaluation is negative.  No signs of congestive heart failure exacerbation.  No pneumonia.  Work-up is reassuring and she is symptom-free, normal vital signs, will discharge.        Final Clinical Impression(s) / ED Diagnoses Final diagnoses:  Shortness of breath    Rx / DC Orders ED Discharge Orders     None         Niccole Witthuhn, Gwenyth Allegra, MD 03/28/22 0149

## 2022-03-30 ENCOUNTER — Ambulatory Visit: Payer: Medicare PPO | Admitting: Pulmonary Disease

## 2022-04-06 ENCOUNTER — Encounter (HOSPITAL_COMMUNITY): Payer: Self-pay | Admitting: *Deleted

## 2022-04-06 ENCOUNTER — Emergency Department (HOSPITAL_COMMUNITY)
Admission: EM | Admit: 2022-04-06 | Discharge: 2022-04-06 | Disposition: A | Discharge status: Home / Self Care | Payer: Medicare PPO | Attending: Emergency Medicine | Admitting: Emergency Medicine

## 2022-04-06 ENCOUNTER — Other Ambulatory Visit: Payer: Self-pay

## 2022-04-06 DIAGNOSIS — I10 Essential (primary) hypertension: Secondary | ICD-10-CM | POA: Diagnosis not present

## 2022-04-06 DIAGNOSIS — I959 Hypotension, unspecified: Secondary | ICD-10-CM | POA: Diagnosis not present

## 2022-04-06 DIAGNOSIS — Z7984 Long term (current) use of oral hypoglycemic drugs: Secondary | ICD-10-CM | POA: Diagnosis not present

## 2022-04-06 DIAGNOSIS — E119 Type 2 diabetes mellitus without complications: Secondary | ICD-10-CM | POA: Diagnosis not present

## 2022-04-06 DIAGNOSIS — Z79899 Other long term (current) drug therapy: Secondary | ICD-10-CM | POA: Insufficient documentation

## 2022-04-06 DIAGNOSIS — R251 Tremor, unspecified: Secondary | ICD-10-CM | POA: Insufficient documentation

## 2022-04-06 DIAGNOSIS — F419 Anxiety disorder, unspecified: Secondary | ICD-10-CM | POA: Diagnosis not present

## 2022-04-06 DIAGNOSIS — Z7982 Long term (current) use of aspirin: Secondary | ICD-10-CM | POA: Insufficient documentation

## 2022-04-06 DIAGNOSIS — R531 Weakness: Secondary | ICD-10-CM | POA: Insufficient documentation

## 2022-04-06 DIAGNOSIS — R5381 Other malaise: Secondary | ICD-10-CM | POA: Diagnosis not present

## 2022-04-06 LAB — BASIC METABOLIC PANEL
Anion gap: 7 (ref 5–15)
BUN: 39 mg/dL — ABNORMAL HIGH (ref 8–23)
CO2: 29 mmol/L (ref 22–32)
Calcium: 8.6 mg/dL — ABNORMAL LOW (ref 8.9–10.3)
Chloride: 104 mmol/L (ref 98–111)
Creatinine, Ser: 1.94 mg/dL — ABNORMAL HIGH (ref 0.44–1.00)
GFR, Estimated: 26 mL/min — ABNORMAL LOW (ref >60)
Glucose, Bld: 208 mg/dL — ABNORMAL HIGH (ref 70–99)
Potassium: 4.2 mmol/L (ref 3.5–5.1)
Sodium: 140 mmol/L (ref 135–145)

## 2022-04-06 LAB — CBC WITH DIFFERENTIAL/PLATELET
Abs Immature Granulocytes: 0.08 10*3/uL — ABNORMAL HIGH (ref 0.00–0.07)
Basophils Absolute: 0 10*3/uL (ref 0.0–0.1)
Basophils Relative: 0 %
Eosinophils Absolute: 0.1 10*3/uL (ref 0.0–0.5)
Eosinophils Relative: 1 %
HCT: 38 % (ref 36.0–46.0)
Hemoglobin: 12.2 g/dL (ref 12.0–15.0)
Immature Granulocytes: 1 %
Lymphocytes Relative: 11 %
Lymphs Abs: 1.4 10*3/uL (ref 0.7–4.0)
MCH: 30 pg (ref 26.0–34.0)
MCHC: 32.1 g/dL (ref 30.0–36.0)
MCV: 93.4 fL (ref 80.0–100.0)
Monocytes Absolute: 0.9 10*3/uL (ref 0.1–1.0)
Monocytes Relative: 7 %
Neutro Abs: 10.2 10*3/uL — ABNORMAL HIGH (ref 1.7–7.7)
Neutrophils Relative %: 80 %
Platelets: 189 10*3/uL (ref 150–400)
RBC: 4.07 MIL/uL (ref 3.87–5.11)
RDW: 13.3 % (ref 11.5–15.5)
WBC: 12.6 10*3/uL — ABNORMAL HIGH (ref 4.0–10.5)
nRBC: 0 % (ref 0.0–0.2)

## 2022-04-06 MED ORDER — BACLOFEN 10 MG PO TABS
10.0000 mg | ORAL_TABLET | Freq: Once | ORAL | Status: AC
  Administered 2022-04-06: 10 mg via ORAL
  Filled 2022-04-06: qty 1

## 2022-04-06 MED ORDER — HYDROXYZINE HCL 25 MG PO TABS: 25.0000 mg | ORAL_TABLET | Freq: Three times a day (TID) | ORAL | 0 refills | Status: DC | PRN

## 2022-04-06 MED ORDER — HYDROXYZINE HCL 25 MG PO TABS
25.0000 mg | ORAL_TABLET | Freq: Once | ORAL | Status: AC
  Administered 2022-04-06: 25 mg via ORAL
  Filled 2022-04-06: qty 1

## 2022-04-06 NOTE — Discharge Instructions (Signed)
Follow-up with your family doctor within 1 to 2 weeks for recheck

## 2022-04-06 NOTE — ED Provider Notes (Addendum)
[facility] Provider Note   CSN: [id number] Arrival date & time: 04/06/22  2620     History  Chief Complaint  Patient presents with   Tremors    Carolyn Lambert is a 81 y.o. female.  Patient has a history of diabetes hypertension and tremors.  She has been shaking more lately  The history is provided by the patient and medical records. No language interpreter was used.  Weakness Severity:  Mild Onset quality:  Sudden Timing:  Constant Progression:  Waxing and waning Chronicity:  Recurrent Context: not alcohol use   Relieved by:  Nothing Worsened by:  Nothing Ineffective treatments:  None tried Associated symptoms: no abdominal pain, no chest pain, no cough, no diarrhea, no frequency, no headaches and no seizures        Home Medications Prior to Admission medications   Medication Sig Start Date End Date Taking? Authorizing Provider  hydrOXYzine (ATARAX) 25 MG tablet Take 1 tablet (25 mg total) by mouth every 8 (eight) hours as needed for anxiety. 04/06/22  Yes Milton Ferguson, MD  alendronate (FOSAMAX) 70 MG tablet TAKE 1 TABLET(70 MG) BY MOUTH 1 TIME A WEEK WITH A FULL GLASS OF WATER AND ON AN EMPTY STOMACH 11/10/21   Hali Marry, MD  amLODipine (NORVASC) 10 MG tablet Take 1 tablet (10 mg total) by mouth daily. Patient taking differently: Take 10 mg by mouth every evening. 11/10/21   Hali Marry, MD  ammonium lactate (LAC-HYDRIN) 12 % lotion Apply 1 application topically daily. After showers 04/11/20   [provider]  aspirin 81 MG EC tablet Take by mouth.    [provider]  atorvastatin (LIPITOR) 20 MG tablet Take 1 tablet (20 mg total) by mouth daily. 11/10/21   Hali Marry, MD  baclofen (LIORESAL) 10 MG tablet Take 1 tablet (10 mg total) by mouth 2 (two) times daily as needed for muscle spasms. TAKE 1 TABLET BY MOUTH 2  TIMES DAILY AS DIRECTED Strength: 10 mg 08/26/21   Hali Marry, MD   Betamethasone Valerate 0.12 % foam Apply 1 application topically 3 (three) times a week. 11/10/21   Hali Marry, MD  buPROPion (WELLBUTRIN XL) 150 MG 24 hr tablet TAKE 1 TABLET BY MOUTH  TWICE DAILY AT 8AM AND 3PM Patient taking differently: 300 mg daily. 11/10/21   Hali Marry, MD  clobetasol (TEMOVATE) 0.05 % external solution Apply topically. 09/08/21   [provider]  donepezil (ARICEPT) 5 MG tablet Take 1 tablet (5 mg total) by mouth at bedtime. Take 1 pill daily for 4 weeks, then increase to 2 pills daily 12/28/21   Genia Harold, MD  DULoxetine (CYMBALTA) 60 MG capsule Take 1 capsule (60 mg total) by mouth daily. 11/10/21   Hali Marry, MD  furosemide (LASIX) 20 MG tablet TAKE ONE TABLET BY MOUTH DAILY AS NEEDED FOR SWELLING 11/10/21   Hali Marry, MD  glipiZIDE (GLUCOTROL XL) 10 MG 24 hr tablet Take 10 mg by mouth daily. 12/14/21   [provider]  hydrochlorothiazide (HYDRODIURIL) 25 MG tablet Take 25 mg by mouth daily. 12/14/21   [provider]  ketoconazole (NIZORAL) 2 % shampoo WASH 3 TIMES A WEEK. LATHER, LET SIT FOR 5 MINUTES AND RINSE 12/31/21   Hali Marry, MD  Melatonin 10 MG CAPS Take 1 capsule by mouth every evening.    [provider]  metoprolol succinate (TOPROL-XL) 25 MG 24 hr tablet Take 1 tablet (  25 mg total) by mouth at bedtime. For Blood pressure 11/10/21   Hali Marry, MD  Multiple Vitamins-Minerals (PRESERVISION AREDS 2 PO) Take 1 capsule by mouth daily.    [provider]  OXYGEN Inhale 2 L/min into the lungs at bedtime. O2 concentrator    [provider]  RESTASIS 0.05 % ophthalmic emulsion  06/19/20   Laurence Aly, OD  risperiDONE (RISPERDAL) 0.5 MG tablet Take by mouth. 01/24/22   [provider]  vitamin B-12 (CYANOCOBALAMIN) 1000 MCG tablet Take 1,000 mcg by mouth daily.    [provider]      Allergies    Losartan, Gabapentin, Lisinopril,  Oxycodone, and Oxycodone-aspirin    Review of Systems   Review of Systems  Constitutional:  Negative for appetite change and fatigue.  HENT:  Negative for congestion, ear discharge and sinus pressure.   Eyes:  Negative for discharge.  Respiratory:  Negative for cough.   Cardiovascular:  Negative for chest pain.  Gastrointestinal:  Negative for abdominal pain and diarrhea.  Genitourinary:  Negative for frequency and hematuria.  Musculoskeletal:  Negative for back pain.       Tremors  Skin:  Negative for rash.  Neurological:  Positive for weakness. Negative for seizures and headaches.  Psychiatric/Behavioral:  Negative for hallucinations.     Physical Exam Updated Vital Signs BP 130/81   Pulse 76   Temp 97.7 F (36.5 C) (Oral)   Resp 17   Ht '5\' 7"'$  (1.702 m)   Wt 117.9 kg   SpO2 95%   BMI 40.72 kg/m  Physical Exam Vitals and nursing note reviewed.  Constitutional:      Appearance: She is well-developed.  HENT:     Head: Normocephalic.  Eyes:     General: No scleral icterus.    Conjunctiva/sclera: Conjunctivae normal.  Neck:     Thyroid: No thyromegaly.  Cardiovascular:     Rate and Rhythm: Normal rate and regular rhythm.     Heart sounds: No murmur heard.    No friction rub. No gallop.  Pulmonary:     Breath sounds: No stridor. No wheezing or rales.  Chest:     Chest wall: No tenderness.  Abdominal:     General: There is no distension.     Tenderness: There is no abdominal tenderness. There is no rebound.  Musculoskeletal:        General: Normal range of motion.     Cervical back: Neck supple.     Comments: Mild tremor  Lymphadenopathy:     Cervical: No cervical adenopathy.  Skin:    Findings: No erythema or rash.  Neurological:     Mental Status: She is alert and oriented to person, place, and time.     Motor: No abnormal muscle tone.     Coordination: Coordination normal.  Psychiatric:        Behavior: Behavior normal.     ED Results / Procedures /  Treatments   Labs (all labs ordered are listed, but only abnormal results are displayed) Labs Reviewed  CBC WITH DIFFERENTIAL/PLATELET - Abnormal; Notable for the following components:      Result Value   WBC 12.6 (*)    Neutro Abs 10.2 (*)    Abs Immature Granulocytes 0.08 (*)    All other components within normal limits  BASIC METABOLIC PANEL - Abnormal; Notable for the following components:   Glucose, Bld 208 (*)    BUN 39 (*)    Creatinine, Ser  1.94 (*)    Calcium 8.6 (*)    GFR, Estimated 26 (*)    All other components within normal limits    EKG None  Radiology No results found.  Procedures Procedures    Medications Ordered in ED Medications  hydrOXYzine (ATARAX) tablet 25 mg (25 mg Oral Given 04/06/22 0912)  baclofen (LIORESAL) tablet 10 mg (10 mg Oral Given 04/06/22 7543)    ED Course/ Medical Decision Making/ A&P                           Medical Decision Making Amount and/or Complexity of Data Reviewed Labs: ordered.  Risk Prescription drug management.   Patient with tremors that seem to be getting worse.  She improved with some Vistaril.  Suspect anxiety related.  She will follow-up with her PCP        Final Clinical Impression(s) / ED Diagnoses Final diagnoses:  Anxiety    Rx / DC Orders ED Discharge Orders          Ordered    hydrOXYzine (ATARAX) 25 MG tablet  Every 8 hours PRN        04/06/22 6067              Milton Ferguson, MD 04/06/22 1743    Milton Ferguson, MD 04/06/22 1745

## 2022-04-06 NOTE — ED Triage Notes (Signed)
Pt brought in by [facility] from home with c/o constant tremors since last night. Pt reports she normally has tremors but just intermittently.

## 2022-04-19 DIAGNOSIS — Z111 Encounter for screening for respiratory tuberculosis: Secondary | ICD-10-CM | POA: Diagnosis not present

## 2022-04-19 DIAGNOSIS — Z0289 Encounter for other administrative examinations: Secondary | ICD-10-CM | POA: Diagnosis not present

## 2022-04-19 DIAGNOSIS — Z23 Encounter for immunization: Secondary | ICD-10-CM | POA: Diagnosis not present

## 2022-04-20 DIAGNOSIS — Z111 Encounter for screening for respiratory tuberculosis: Secondary | ICD-10-CM | POA: Diagnosis not present

## 2022-05-02 DIAGNOSIS — F332 Major depressive disorder, recurrent severe without psychotic features: Secondary | ICD-10-CM | POA: Diagnosis not present

## 2022-05-02 DIAGNOSIS — G3184 Mild cognitive impairment, so stated: Secondary | ICD-10-CM | POA: Diagnosis not present

## 2022-05-28 ENCOUNTER — Emergency Department (HOSPITAL_COMMUNITY)
Admission: EM | Admit: 2022-05-28 | Discharge: 2022-05-28 | Disposition: A | Discharge status: Skilled Nursing Facility | Payer: Medicare PPO | Attending: Emergency Medicine | Admitting: Emergency Medicine

## 2022-05-28 ENCOUNTER — Other Ambulatory Visit: Payer: Self-pay

## 2022-05-28 ENCOUNTER — Encounter (HOSPITAL_COMMUNITY): Payer: Self-pay

## 2022-05-28 DIAGNOSIS — Z79899 Other long term (current) drug therapy: Secondary | ICD-10-CM | POA: Diagnosis not present

## 2022-05-28 DIAGNOSIS — R251 Tremor, unspecified: Secondary | ICD-10-CM | POA: Insufficient documentation

## 2022-05-28 DIAGNOSIS — R252 Cramp and spasm: Secondary | ICD-10-CM | POA: Diagnosis present

## 2022-05-28 DIAGNOSIS — I10 Essential (primary) hypertension: Secondary | ICD-10-CM | POA: Insufficient documentation

## 2022-05-28 DIAGNOSIS — G2581 Restless legs syndrome: Secondary | ICD-10-CM | POA: Insufficient documentation

## 2022-05-28 DIAGNOSIS — Z7982 Long term (current) use of aspirin: Secondary | ICD-10-CM | POA: Diagnosis not present

## 2022-05-28 LAB — BASIC METABOLIC PANEL
Anion gap: 7 (ref 5–15)
BUN: 27 mg/dL — ABNORMAL HIGH (ref 8–23)
CO2: 29 mmol/L (ref 22–32)
Calcium: 8.3 mg/dL — ABNORMAL LOW (ref 8.9–10.3)
Chloride: 108 mmol/L (ref 98–111)
Creatinine, Ser: 1.59 mg/dL — ABNORMAL HIGH (ref 0.44–1.00)
GFR, Estimated: 32 mL/min — ABNORMAL LOW (ref >60)
Glucose, Bld: 103 mg/dL — ABNORMAL HIGH (ref 70–99)
Potassium: 4.1 mmol/L (ref 3.5–5.1)
Sodium: 144 mmol/L (ref 135–145)

## 2022-05-28 MED ORDER — LORAZEPAM 0.5 MG PO TABS
0.5000 mg | ORAL_TABLET | Freq: Once | ORAL | Status: AC
Start: 2022-05-28 — End: 2022-05-28
  Administered 2022-05-28: 0.5 mg via ORAL
  Filled 2022-05-28: qty 1

## 2022-05-28 NOTE — Discharge Instructions (Addendum)
It was our pleasure to provide your ER care today - we hope that you feel better.  Follow up with your doctor this coming week - discuss possible adjustment in meds to help with symptoms.   Return to ER if worse, new symptoms, fevers, new/severe pain, chest pain, trouble breathing, or other concern.

## 2022-05-28 NOTE — ED Provider Notes (Signed)
[facility] EMERGENCY DEPARTMENT Provider Note   CSN: [id number] Arrival date & time: 05/28/22  4098     History  Chief Complaint  Patient presents with   Spasms    Carolyn Lambert is a 81 y.o. female.  Patient with c/o muscle spasms in legs and 'all over'. Pt with hx same. Pt limited/poor historian - level 5 caveat. Pt unsure if any change in meds or new meds. Denies acute or abrupt change in symptoms, but indicates was bothersome tonight. Denies focal or unilateral pain or spasm. No fever or chills. No chest pain or sob. No nv. No headache.   The history is provided by the patient, medical records and the EMS personnel. The history is limited by the condition of the patient.       Home Medications Prior to Admission medications   Medication Sig Start Date End Date Taking? Authorizing Provider  alendronate (FOSAMAX) 70 MG tablet TAKE 1 TABLET(70 MG) BY MOUTH 1 TIME A WEEK WITH A FULL GLASS OF WATER AND ON AN EMPTY STOMACH 11/10/21   Hali Marry, MD  amLODipine (NORVASC) 10 MG tablet Take 1 tablet (10 mg total) by mouth daily. Patient taking differently: Take 10 mg by mouth every evening. 11/10/21   Hali Marry, MD  ammonium lactate (LAC-HYDRIN) 12 % lotion Apply 1 application topically daily. After showers 04/11/20   [provider]  aspirin 81 MG EC tablet Take by mouth.    [provider]  atorvastatin (LIPITOR) 20 MG tablet Take 1 tablet (20 mg total) by mouth daily. 11/10/21   Hali Marry, MD  baclofen (LIORESAL) 10 MG tablet Take 1 tablet (10 mg total) by mouth 2 (two) times daily as needed for muscle spasms. TAKE 1 TABLET BY MOUTH 2  TIMES DAILY AS DIRECTED Strength: 10 mg 08/26/21   Hali Marry, MD  Betamethasone Valerate 0.12 % foam Apply 1 application topically 3 (three) times a week. 11/10/21   Hali Marry, MD  buPROPion (WELLBUTRIN XL) 150 MG 24 hr tablet TAKE 1 TABLET BY MOUTH  TWICE DAILY AT 8AM AND  3PM Patient taking differently: 300 mg daily. 11/10/21   Hali Marry, MD  clobetasol (TEMOVATE) 0.05 % external solution Apply topically. 09/08/21   [provider]  donepezil (ARICEPT) 5 MG tablet Take 1 tablet (5 mg total) by mouth at bedtime. Take 1 pill daily for 4 weeks, then increase to 2 pills daily 12/28/21   Genia Harold, MD  DULoxetine (CYMBALTA) 60 MG capsule Take 1 capsule (60 mg total) by mouth daily. 11/10/21   Hali Marry, MD  furosemide (LASIX) 20 MG tablet TAKE ONE TABLET BY MOUTH DAILY AS NEEDED FOR SWELLING 11/10/21   Hali Marry, MD  glipiZIDE (GLUCOTROL XL) 10 MG 24 hr tablet Take 10 mg by mouth daily. 12/14/21   [provider]  hydrochlorothiazide (HYDRODIURIL) 25 MG tablet Take 25 mg by mouth daily. 12/14/21   [provider]  hydrOXYzine (ATARAX) 25 MG tablet Take 1 tablet (25 mg total) by mouth every 8 (eight) hours as needed for anxiety. 04/06/22   Milton Ferguson, MD  ketoconazole (NIZORAL) 2 % shampoo WASH 3 TIMES A WEEK. LATHER, LET SIT FOR 5 MINUTES AND RINSE 12/31/21   Hali Marry, MD  Melatonin 10 MG CAPS Take 1 capsule by mouth every evening.    [provider]  metoprolol succinate (TOPROL-XL) 25 MG 24 hr tablet Take 1 tablet (25 mg total)  by mouth at bedtime. For Blood pressure 11/10/21   Hali Marry, MD  Multiple Vitamins-Minerals (PRESERVISION AREDS 2 PO) Take 1 capsule by mouth daily.    [provider]  OXYGEN Inhale 2 L/min into the lungs at bedtime. O2 concentrator    [provider]  RESTASIS 0.05 % ophthalmic emulsion  06/19/20   Laurence Aly, OD  risperiDONE (RISPERDAL) 0.5 MG tablet Take by mouth. 01/24/22   [provider]  vitamin B-12 (CYANOCOBALAMIN) 1000 MCG tablet Take 1,000 mcg by mouth daily.    [provider]      Allergies    Losartan, Gabapentin, Lisinopril, Oxycodone, and Oxycodone-aspirin    Review of Systems   Review of Systems   Constitutional:  Negative for chills and fever.  HENT:  Negative for sore throat.   Eyes:  Negative for redness.  Respiratory:  Negative for cough and shortness of breath.   Cardiovascular:  Negative for chest pain.  Gastrointestinal:  Negative for abdominal pain, diarrhea and vomiting.  Genitourinary:  Negative for dysuria and flank pain.  Musculoskeletal:  Negative for back pain and neck pain.  Skin:  Negative for rash.  Neurological:  Negative for headaches.    Physical Exam Updated Vital Signs BP (!) 145/68   Pulse 69   Temp 97.7 F (36.5 C) (Oral)   Resp 20   Ht 1.702 m ('5\' 7"'$ )   Wt 118 kg   SpO2 96%   BMI 40.74 kg/m  Physical Exam Vitals and nursing note reviewed.  Constitutional:      Appearance: Normal appearance. She is well-developed.  HENT:     Head: Atraumatic.     Nose: Nose normal.     Mouth/Throat:     Mouth: Mucous membranes are moist.  Eyes:     General: No scleral icterus.    Conjunctiva/sclera: Conjunctivae normal.     Pupils: Pupils are equal, round, and reactive to light.  Neck:     Trachea: No tracheal deviation.  Cardiovascular:     Rate and Rhythm: Normal rate and regular rhythm.     Pulses: Normal pulses.     Heart sounds: Normal heart sounds. No murmur heard.    No friction rub. No gallop.  Pulmonary:     Effort: Pulmonary effort is normal. No respiratory distress.     Breath sounds: Normal breath sounds.  Abdominal:     General: There is no distension.     Palpations: Abdomen is soft.     Tenderness: There is no abdominal tenderness.  Genitourinary:    Comments: No cva tenderness.  Musculoskeletal:        General: No swelling or tenderness.     Cervical back: Normal range of motion and neck supple. No rigidity. No muscular tenderness.  Skin:    General: Skin is warm and dry.     Findings: No rash.  Neurological:     Mental Status: She is alert.     Comments: Alert, speech normal. Motor/sens grossly intact bil. No gross tremor  noted during eval.   Psychiatric:     Comments: Mildly anxious.      ED Results / Procedures / Treatments   Labs (all labs ordered are listed, but only abnormal results are displayed) Results for orders placed or performed during the hospital encounter of 16/10/96  Basic metabolic panel  Result Value Ref Range   Sodium 144 135 - 145 mmol/L   Potassium 4.1 3.5 - 5.1 mmol/L   Chloride  108 98 - 111 mmol/L   CO2 29 22 - 32 mmol/L   Glucose, Bld 103 (H) 70 - 99 mg/dL   BUN 27 (H) 8 - 23 mg/dL   Creatinine, Ser 1.59 (H) 0.44 - 1.00 mg/dL   Calcium 8.3 (L) 8.9 - 10.3 mg/dL   GFR, Estimated 32 (L) >60 mL/min   Anion gap 7 5 - 15    EKG EKG Interpretation  Date/Time:  Saturday May 28 2022 02:25:43 EDT Ventricular Rate:  63 PR Interval:  156 QRS Duration: 107 QT Interval:  418 QTC Calculation: 428 R Axis:   87 Text Interpretation: Sinus rhythm Baseline wander Confirmed by Lajean Saver [phone]) on 05/28/2022 2:37:35 AM  Radiology No results found.  Procedures Procedures    Medications Ordered in ED Medications  LORazepam (ATIVAN) tablet 0.5 mg (has no administration in time range)    ED Course/ Medical Decision Making/ A&P                           Medical Decision Making Problems Addressed: Essential hypertension: chronic illness or injury with exacerbation, progression, or side effects of treatment that poses a threat to life or bodily functions Occasional tremors: acute illness or injury with systemic symptoms Restless leg syndrome: chronic illness or injury with exacerbation, progression, or side effects of treatment  Amount and/or Complexity of Data Reviewed Independent Historian: EMS    Details: hx External Data Reviewed: labs and notes. Labs: ordered. Decision-making details documented in ED Course. ECG/medicine tests: ordered and independent interpretation performed. Decision-making details documented in ED Course.  Risk Prescription drug  management. Decision regarding hospitalization.   Iv ns. Continuous pulse ox and cardiac monitoring. Labs ordered/sent.   Diff dx includes aki, low k/other electrolyte abn, etc - dispo decision including potential need for admission if hyper/hypokal, aki, etc considered  - will get labs and reassess.   Reviewed nursing notes and prior charts for additional history. External reports reviewed. Additional history from: EMS.   Cardiac monitor: sinus rhythm, rate  Labs reviewed/interpreted by me - k normal. Cr c/w prior.   Ativan .5 mg po.   Recheck, pt comfortable appearing, resting, no distress.   Pt appears stable for d/c.   Rec pcp f/u.            Final Clinical Impression(s) / ED Diagnoses Final diagnoses:  None    Rx / DC Orders ED Discharge Orders     None         Lajean Saver, MD 05/28/22 [phone]

## 2022-05-28 NOTE — ED Triage Notes (Signed)
Pt arrived via [facility] from [facility] who report Pt fell. Pt reports she has multiple falls and gets around on a rolling walker. Pt present with visible muscle spasms. SNF could not provide any paperwork to add background for Pt due to computer malfunctions at facility.

## 2022-06-15 IMAGING — DX DG CHEST 2V
3 series · 3 of 3 positions shown · non-contrast
Comparison: 08/09/2020

CLINICAL DATA: Dyspnea on exertion

EXAM:
CHEST - 2 VIEW

[chest pa (1 of 2)]
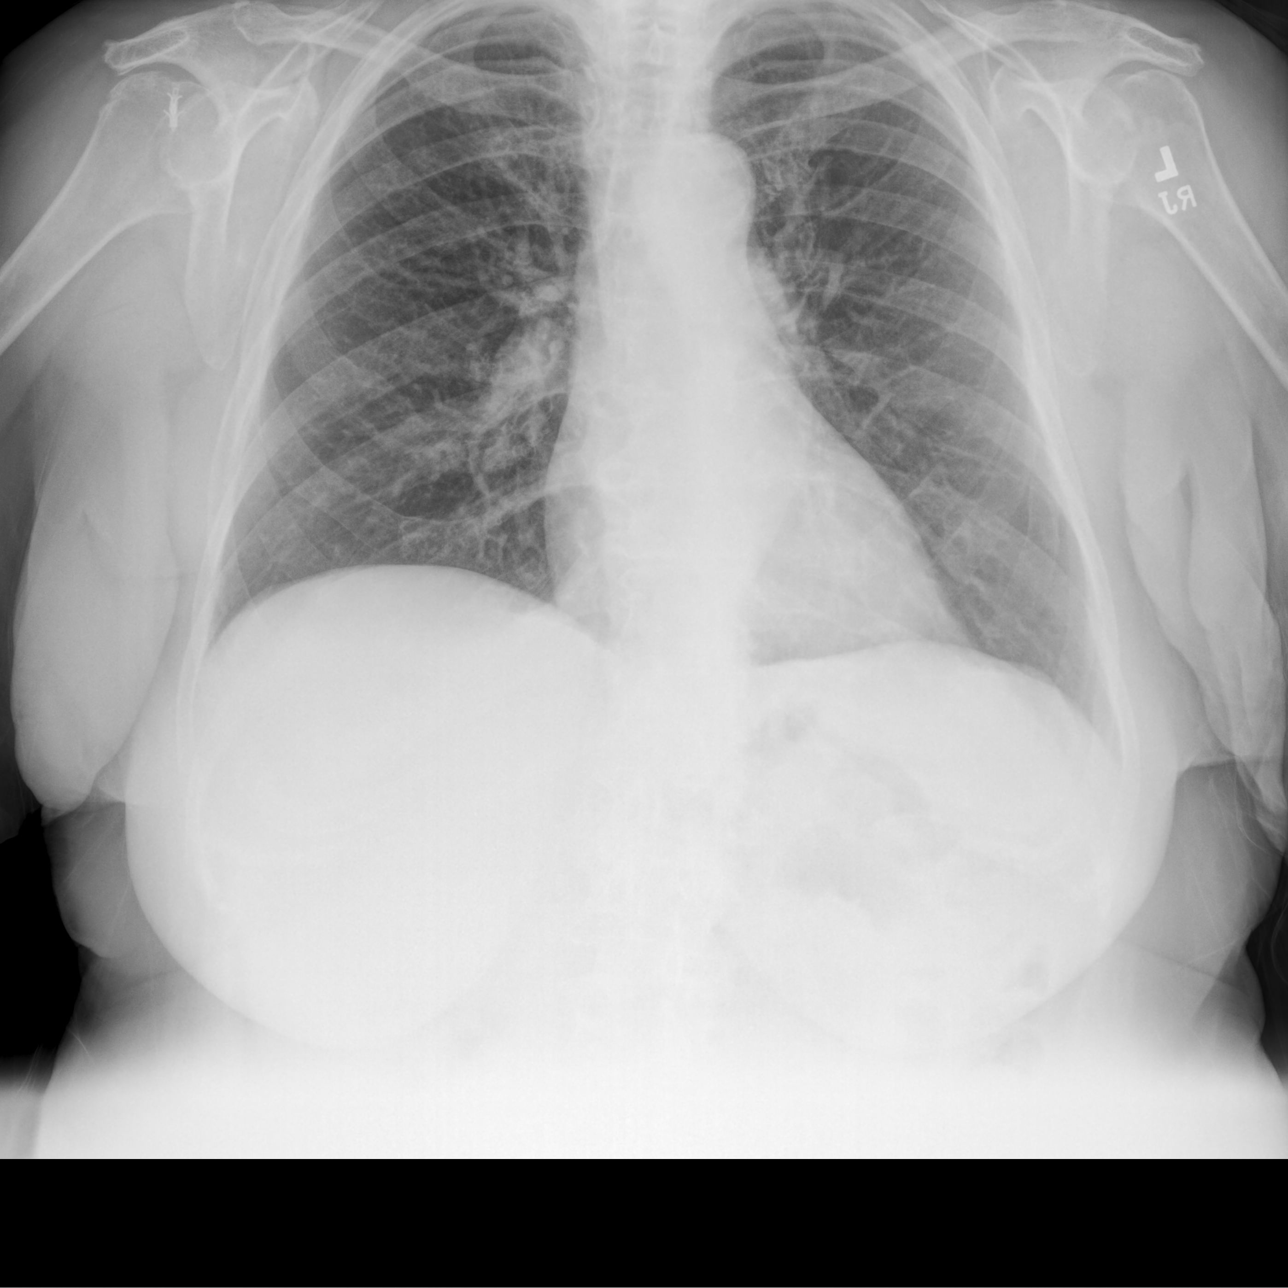

[chest pa (2 of 2)]
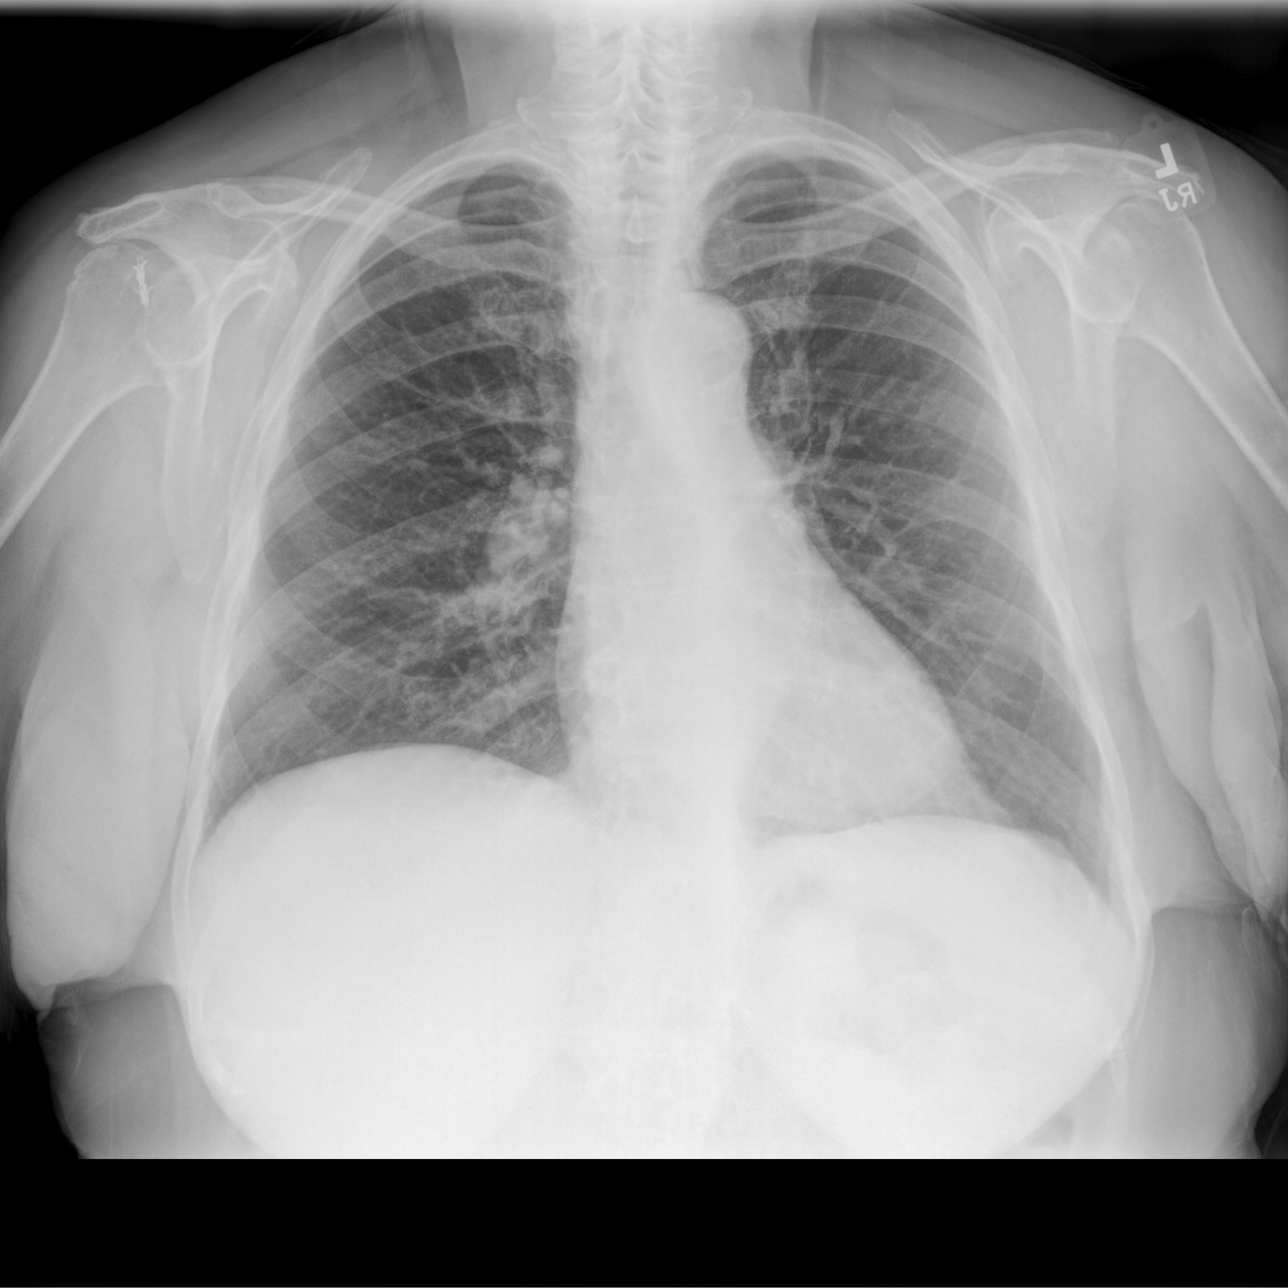

[chest lat]
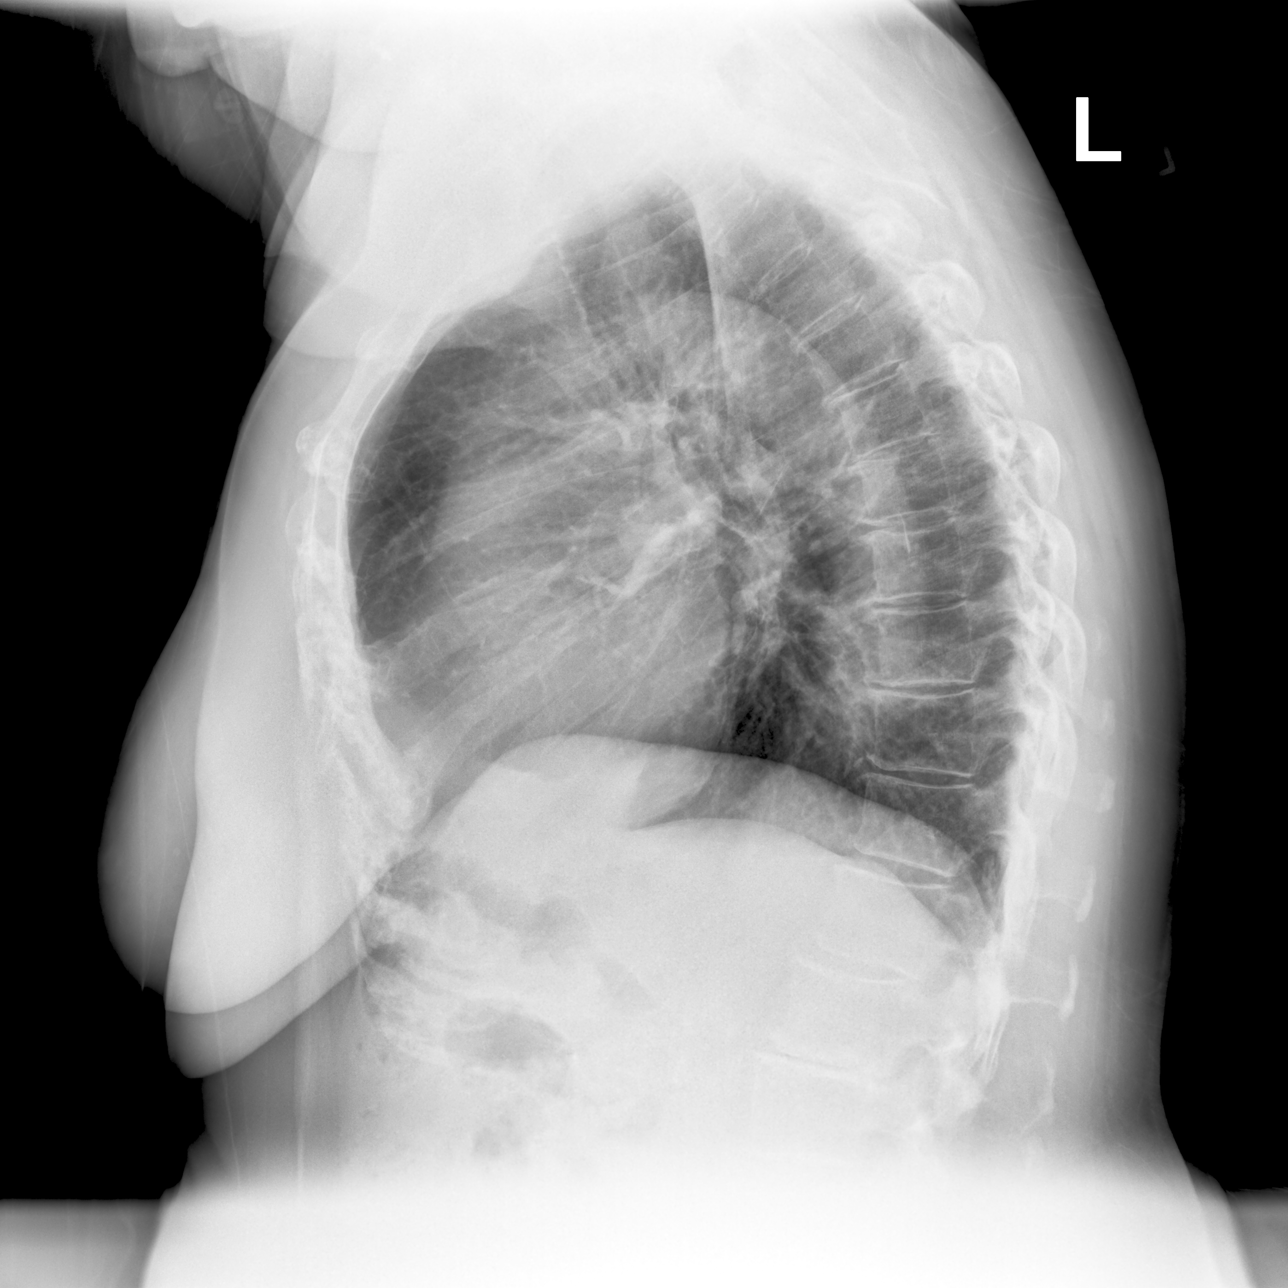

[3 of 3 positions shown; findings below may reference images not displayed]

FINDINGS: The heart size and mediastinal contours are within normal limits.
Both lungs are clear. The visualized skeletal structures are
unremarkable. Aortic atherosclerosis. Postsurgical changes at the
right humeral head
IMPRESSION: No active cardiopulmonary disease.

## 2022-07-05 ENCOUNTER — Ambulatory Visit: Payer: Medicare PPO | Admitting: Psychiatry

## 2022-09-21 ENCOUNTER — Encounter: Payer: Medicare PPO | Admitting: Psychology

## 2022-09-28 ENCOUNTER — Encounter: Payer: Medicare PPO | Admitting: Psychology

## 2023-05-15 ENCOUNTER — Ambulatory Visit (INDEPENDENT_AMBULATORY_CARE_PROVIDER_SITE_OTHER): Payer: Medicare PPO | Admitting: Gastroenterology

## 2023-05-15 ENCOUNTER — Encounter: Payer: Self-pay | Admitting: Gastroenterology

## 2023-05-15 VITALS — BP 115/71 | HR 60 | Temp 97.9°F | Ht 67.0 in | Wt 266.0 lb

## 2023-05-15 DIAGNOSIS — K625 Hemorrhage of anus and rectum: Secondary | ICD-10-CM

## 2023-05-15 NOTE — Patient Instructions (Addendum)
We will complete multiple FOBT cards to assess for presence of blood in stool.  Monitor for any recurrence of rectal bleeding.  If rectal bleeding does recur that I would recommend Anusol twice daily for 1 week.  Also if recurrence of rectal bleeding we should consider scheduling colonoscopy.  It was a pleasure to see you today. I want to create trusting relationships with patients. If you receive a survey regarding your visit,  I greatly appreciate you taking time to fill this out on paper or through your MyChart. I value your feedback.  Brooke Bonito, MSN, FNP-BC, AGACNP-BC [facility]

## 2023-05-15 NOTE — Progress Notes (Signed)
GI Office Note    Referring Provider: Roger Kill, * Primary Care Physician:  Bernita Buffy  Primary Gastroenterologist: Gerrit Friends.Rourk, MD   Chief Complaint   Chief Complaint  Patient presents with   Rectal Bleeding   History of Present Illness   Carolyn Lambert is a 82 y.o. female presenting today at the request of Williams, Breejante J, * for rectal bleeding.   Last colonoscopy in May 2014 with diverticulosis, otherwise normal.  Previously seen by Dr. Lavon Paganini at [facility].  Last seen in October 2021 for fecal incontinence and bile salt diarrhea.  At time of visit she was having formed stool with incontinence and denied any watery stools.  Also denied any rectal bleeding or blood in the stools.  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  No large hemorrhoids or fissure noted.  Advised trial of cholestyramine as well.  If symptoms persisted advised to consider colonoscopy with biopsy.  Labs in March 2024 via [facility] system revealed hemoglobin 13.5  Today: Had one episode a few weeks ago and it was enough to make the toilet water red. Was not straining when this happened. She denied any abdominal pain. Denied any rectal pain. This has never happened before.   She denies any issues with diarrhea or constipation. Goes every other day. Stools are soft. She states she has gained weight, not lost weight. Appetite is good. Denies chest pain or shortness of breath.   Denies any upper GI symptoms  Current Outpatient Medications  Medication Sig Dispense Refill   acetaminophen (TYLENOL) 500 MG tablet Take 500 mg by mouth 2 (two) times daily.     alendronate (FOSAMAX) 70 MG tablet TAKE 1 TABLET(70 MG) BY MOUTH 1 TIME A WEEK WITH A FULL GLASS OF WATER AND ON AN EMPTY STOMACH 12 tablet 3   amLODipine (NORVASC) 10 MG tablet Take 1 tablet (10 mg  total) by mouth daily. (Patient taking differently: Take 10 mg by mouth every evening.) 90 tablet 0   aspirin 81 MG EC tablet Take by mouth.     atorvastatin (LIPITOR) 20 MG tablet Take 1 tablet (20 mg total) by mouth daily. 90 tablet 3   baclofen (LIORESAL) 10 MG tablet Take 1 tablet (10 mg total) by mouth 2 (two) times daily as needed for muscle spasms. TAKE 1 TABLET BY MOUTH 2  TIMES DAILY AS DIRECTED Strength: 10 mg 180 tablet 1   buPROPion (WELLBUTRIN XL) 150 MG 24 hr tablet TAKE 1 TABLET BY MOUTH  TWICE DAILY AT 8AM AND 3PM (Patient taking differently: Take 300 mg by mouth daily.) 180 tablet 1   Cholecalciferol (VITAMIN D3) 50 MCG (2000 UT) capsule Take 2,000 Units by mouth daily.     clobetasol (TEMOVATE) 0.05 % external solution Apply topically.     donepezil (ARICEPT) 5 MG tablet Take 1 tablet (5 mg total) by mouth at bedtime. Take 1 pill daily for 4 weeks, then increase to 2 pills daily 60 tablet 3   DULoxetine (CYMBALTA) 60 MG capsule Take 1 capsule (60 mg total) by mouth daily. 90 capsule 1   furosemide (LASIX) 20 MG tablet TAKE ONE TABLET BY MOUTH DAILY AS NEEDED FOR SWELLING 45 tablet 0   glipiZIDE (GLUCOTROL XL) 10 MG 24 hr tablet Take 10 mg by mouth daily.     hydrochlorothiazide (HYDRODIURIL) 25 MG tablet Take  25 mg by mouth daily.     HYDROcodone-acetaminophen (NORCO/VICODIN) 5-325 MG tablet      ketoconazole (NIZORAL) 2 % shampoo WASH 3 TIMES A WEEK. LATHER, LET SIT FOR 5 MINUTES AND RINSE 120 mL PRN   levocetirizine (XYZAL) 5 MG tablet      LORazepam (ATIVAN) 0.5 MG tablet      Melatonin 10 MG CAPS Take 1 capsule by mouth every evening.     metoprolol succinate (TOPROL-XL) 25 MG 24 hr tablet Take 1 tablet (25 mg total) by mouth at bedtime. For Blood pressure 90 tablet 1   Multiple Vitamin (MULTIVITAMIN) capsule Take 1 capsule by mouth daily.     Multiple Vitamins-Minerals (PRESERVISION AREDS 2 PO) Take 1 capsule by mouth daily.     OXYGEN Inhale 2 L/min into the lungs at  bedtime. O2 concentrator     potassium chloride (KLOR-CON) 10 MEQ tablet      RESTASIS 0.05 % ophthalmic emulsion      traZODone (DESYREL) 100 MG tablet      No current facility-administered medications for this visit.    Past Medical History:  Diagnosis Date   Anxiety and depression    Arthritis    Chronic fatigue syndrome    Diabetes mellitus    DVT (deep venous thrombosis) (HCC)    s/p knee replacmet.    Fibromyalgia    GERD (gastroesophageal reflux disease)    Hypercholesteremia    Hypertension    Major depressive disorder 03/20/2012   Obesity    Ovarian cancer (HCC)    Peripheral edema    Sinusitis July 2013   Sleep apnea    O2 concentrator at night    Past Surgical History:  Procedure Laterality Date   ABDOMINAL HYSTERECTOMY     CATARACT EXTRACTION W/ INTRAOCULAR LENS & ANTERIOR VITRECTOMY Left 11/02/26   CATARACT EXTRACTION W/ INTRAOCULAR LENS IMPLANT Right 09/28/2016   LUMBAR DISC SURGERY     x 2   ORIF ANKLE FRACTURE Right 11/13/2018   [facility]   REPLACEMENT TOTAL KNEE Bilateral    2 on left and 1 on right, Dr Lequita Halt   ROTATOR CUFF REPAIR Right    TONSILLECTOMY     VEIN LIGATION AND STRIPPING      Family History  Problem Relation Age of Onset   Hypertension Mother    Atrial fibrillation Mother    Diabetes Mother    Hyperlipidemia Mother    Diabetes type II Father    Lung cancer Father    AAA (abdominal aortic aneurysm) Father    Depression Daughter    Diabetes Daughter    Diabetes Son    Depression Son    Depression Son    Depression Son    Colon cancer Neg Hx     Allergies as of 05/15/2023 - Review Complete 05/15/2023  Allergen Reaction Noted   Losartan Other (See Comments) and Hives 07/11/2016   Gabapentin Other (See Comments) 08/14/2014   Lisinopril Other (See Comments) 09/25/2014   Oxycodone Nausea And Vomiting 12/29/2014   Oxycodone-aspirin Nausea And Vomiting 11/03/2011    Social History   Socioeconomic History   Marital status:  Widowed    Spouse name: Not on file   Number of children: 5   Years of education: 21   Highest education level: GED or equivalent  Occupational History   Occupation: retired  Tobacco Use   Smoking status: Never   Smokeless tobacco: Never  Vaping Use   Vaping status: Never Used  Substance  and Sexual Activity   Alcohol use: Not Currently   Drug use: No   Sexual activity: Not Currently  Other Topics Concern   Not on file  Social History Narrative   Lives alone but her sister does live close by. She enjoys watching t.v. in her free time.   Social Determinants of Health   Financial Resource Strain: Low Risk  (06/28/2021)   Overall Financial Resource Strain (CARDIA)    Difficulty of Paying Living Expenses: Not hard at all  Food Insecurity: No Food Insecurity (11/04/2021)   Received from [facility], [facility]   Hunger Vital Sign    Worried About Running Out of Food in the Last Year: Never true    Ran Out of Food in the Last Year: Never true  Transportation Needs: No Transportation Needs (06/28/2021)   PRAPARE - Administrator, Civil Service (Medical): No    Lack of Transportation (Non-Medical): No  Physical Activity: Inactive (06/28/2021)   Exercise Vital Sign    Days of Exercise per Week: 0 days    Minutes of Exercise per Session: 0 min  Stress: Stress Concern Present (06/28/2021)   [employer] of Occupational Health - Occupational Stress Questionnaire    Feeling of Stress : To some extent  Social Connections: Unknown (02/18/2022)   Received from [facility], [facility]   Social Network    Social Network: Not on file  Intimate Partner Violence: Unknown (01/12/2022)   Received from [facility], [facility]   HITS    Physically Hurt: Not on file    Insult or Talk Down To: Not on file    Threaten Physical Harm: Not on file    Scream or Curse: Not on file     Review of Systems   Gen: Denies any fever, chills, fatigue, weight loss, lack of  appetite.  CV: Denies chest pain, heart palpitations, peripheral edema, syncope.  Resp: Denies shortness of breath at rest or with exertion. Denies wheezing or cough.  GI: see HPI GU : Denies urinary burning, urinary frequency, urinary hesitancy MS: + weakness, unsteady gait. Denies joint pain, cramps, or limitation of movement.  Derm: Denies rash, itching, dry skin Psych: Denies depression, anxiety, memory loss, and confusion Heme: Denies bruising, bleeding, and enlarged lymph nodes.   Physical Exam   BP 115/71 (BP Location: Right Arm, Patient Position: Sitting, Cuff Size: Large)   Pulse 60   Temp 97.9 F (36.6 C) (Oral)   Ht 5\' 7"  (1.702 m)   Wt 266 lb (120.7 kg) Comment: facility reported 04/14/2023  SpO2 100%   BMI 41.66 kg/m   General:   Alert and oriented. Pleasant and cooperative. Well-nourished and well-developed.  Head:  Normocephalic and atraumatic. Eyes:  Without icterus, sclera clear and conjunctiva pink.  Ears:  Normal auditory acuity. Mouth:  No deformity or lesions, oral mucosa pink.  Abdomen:  +BS, soft, non-tender and non-distended. No HSM noted. No guarding or rebound. No masses appreciated.  Rectal:  Weak anal sphincter on exam. Small amount of hemorrhoid tissue palpable to left lateral and right posterior regions. No pain. Stool present on exam. No blood present. (Nurse from facility acted as chaperone). Msk:  Symmetrical without gross deformities. Normal posture. Extremities:  Without edema. Neurologic:  Alert and  oriented x4;  grossly normal neurologically. Skin:  Intact without significant lesions or rashes. Psych:  Alert and cooperative. Normal mood and affect.  Assessment   Carolyn Lambert is a 81 y.o. female with  a history of anxiety and depression, diabetes, DVT, fibromyalgia, HTN, HLD, GERD, obesity, and ovarian cancer presenting today for evaluation of rectal bleeding.  Rectal bleeding: Patient reports she had 1 episode of rectal bleeding  that occurred a few weeks ago.  This was only a one-time occurrence.  She denied any rectal pain, abdominal pain, melena, nausea, vomiting, diarrhea, or constipation.  She denies any hard stools or recent straining.  She also denies any signs of anemia.  Most recent hemoglobin a few months ago was stable at 13.5.  Last colonoscopy was in May 2014 with history of diverticulosis.  This mild rectal bleeding could have been secondary to mild diverticular bleed that was self resolving versus hemorrhoidal bleeding as some mild tissue was palpable on rectal exam.  She does have a weakened anal sphincter tone but no mass palpable on exam.  Advised patient that if she had recurrence then we should consider colonoscopy as she has been hesitant about completing one.  She has no other alarm symptoms present at this time and given her age I would favor holding off on procedure for now but will consider if recurrent.  Also has recurrence will advise Anusol twice daily for 1 week.  PLAN    Monitor for recurrence of rectal bleeding Use Anusol twice daily if bleeding reoccurs Check FOBT Follow-up as needed   Brooke Bonito, MSN, FNP-BC, AGACNP-BC [facility]

## 2023-05-30 ENCOUNTER — Telehealth: Payer: Self-pay | Admitting: Gastroenterology

## 2023-05-30 NOTE — Telephone Encounter (Signed)
Carolyn Lambert at [facility] left a message that she had faxed over information that patient had blood in her stool.  She asked if she would now be scheduled for a colonoscopy.  [phone]

## 2023-05-31 ENCOUNTER — Telehealth: Payer: Self-pay | Admitting: Gastroenterology

## 2023-05-31 NOTE — Telephone Encounter (Signed)
Facility faxed positive results. Placed on your desk.

## 2023-05-31 NOTE — Telephone Encounter (Signed)
Noted  

## 2023-05-31 NOTE — Telephone Encounter (Signed)
Please follow-up with facility to see if FOBT was completed or if they can complete that for [name].  Carolyn Bonito, MSN, APRN, FNP-BC, AGACNP-BC [facility] at [facility]

## 2023-06-05 NOTE — Telephone Encounter (Signed)
Noted  

## 2023-06-05 NOTE — Telephone Encounter (Signed)
Brion Aliment from [facility] left vm regarding scheduling colonoscopy for pt.  Called Belinda back and informed her of providers message and recommendations. She transferred me to Aurea Graff, Armed forces technical officer, to let her know of providers message. Left vm regarding message and recommendations and if any questions to give office a call back.

## 2023-06-13 ENCOUNTER — Institutional Professional Consult (permissible substitution): Payer: Medicare PPO | Admitting: Psychiatry

## 2023-07-05 NOTE — Progress Notes (Unsigned)
[facility]  PATIENT: Carolyn Lambert DOB: 05/07/1941  REFERRING CLINICIAN: Dennard Nip, NP HISTORY FROM: self, aid REASON FOR VISIT: jerking   HISTORICAL  CHIEF COMPLAINT:  Chief Complaint  Patient presents with   Room 16    Pt is here with her Aid. Pt states that she thinks that her memory is going okay. Pt states that she jerks all of the time. Pt states that she has a tremor that happens all of the time.     HISTORY OF PRESENT ILLNESS:  The patient presents for evaluation of intermittent jerking in bilateral shoulders and right leg which reportedly began 2-3 years ago but has been worsening lately.  It is in her entire body but is worse in the shoulders and arms. Happens multiple times per day every day. She maintains awareness during these episodes and has no other associated symptoms. It is not painful. Seems worse when she is stressed or nervous, otherwise no clear triggers. Of note, she has complained of muscle spasms in her legs and shoulders since 2012. It is unclear if this is the same issue. She has taken baclofen for spasms previously.  She was previously seen last year for memory loss, hallucinations, and paranoia. Hallucinations and paranoia were suspected to be secondary to polypharmacy as they improved after her son took over her medications. MOCA at that time was 20/30. 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 She was started on donepezil 10 mg daily.  Memory has been stable on donepezil. However she continues to have visual hallucinations. Will see people who aren't there. She knows they are not real, but is sometimes bothered by them. No new falls since her last visit. Uses a Rolator around the house, but will use wheelchair for longer distances. Rarely feels lightheaded. Denies urinary issues, but wears a  pad just in case of leakage. Has trouble staying asleep at night and wakes up frequently. She does not act out her dreams.  OTHER MEDICAL CONDITIONS: HTN, HLD, diabetes, lumbar spondylosis s/p disc surgery, depression    REVIEW OF SYSTEMS: Full 14 system review of systems performed and negative with exception of: memory loss, hallucinations, jerking  ALLERGIES: Allergies  Allergen Reactions   Losartan Other (See Comments) and Hives    Cough  Cough    Gabapentin Other (See Comments)    dizziness   Lisinopril Other (See Comments)    Dry cough   Oxycodone Nausea And Vomiting   Oxycodone-Aspirin Nausea And Vomiting    HOME MEDICATIONS: Outpatient Medications Prior to Visit  Medication Sig Dispense Refill   acetaminophen (TYLENOL) 500 MG tablet Take 500 mg by mouth 2 (two) times daily.     alendronate (FOSAMAX) 70 MG tablet TAKE 1 TABLET(70 MG) BY MOUTH 1 TIME A WEEK WITH A FULL GLASS OF WATER AND ON AN EMPTY STOMACH 12 tablet 3   amLODipine (NORVASC) 10 MG tablet Take 1 tablet (10 mg total) by mouth daily. (Patient taking differently: Take 10 mg by mouth every evening.) 90 tablet 0   aspirin 81 MG EC tablet Take by mouth.     atorvastatin (LIPITOR) 20 MG tablet Take 1 tablet (20 mg total) by mouth daily. 90 tablet 3   baclofen (LIORESAL) 10 MG tablet Take 1 tablet (10 mg total) by mouth 2 (two) times daily as needed for muscle spasms. TAKE 1 TABLET BY MOUTH 2  TIMES  DAILY AS DIRECTED Strength: 10 mg 180 tablet 1   buPROPion (WELLBUTRIN XL) 150 MG 24 hr tablet TAKE 1 TABLET BY MOUTH  TWICE DAILY AT 8AM AND 3PM (Patient taking differently: Take 300 mg by mouth daily.) 180 tablet 1   Cholecalciferol (VITAMIN D3) 50 MCG (2000 UT) capsule Take 2,000 Units by mouth daily.     clobetasol (TEMOVATE) 0.05 % external solution Apply topically.     donepezil (ARICEPT) 5 MG tablet Take 1 tablet (5 mg total) by mouth at bedtime. Take 1 pill daily for 4 weeks, then increase to 2 pills daily 60 tablet 3    DULoxetine (CYMBALTA) 60 MG capsule Take 1 capsule (60 mg total) by mouth daily. 90 capsule 1   furosemide (LASIX) 20 MG tablet TAKE ONE TABLET BY MOUTH DAILY AS NEEDED FOR SWELLING 45 tablet 0   glipiZIDE (GLUCOTROL XL) 10 MG 24 hr tablet Take 10 mg by mouth daily.     hydrochlorothiazide (HYDRODIURIL) 25 MG tablet Take 25 mg by mouth daily.     HYDROcodone-acetaminophen (NORCO/VICODIN) 5-325 MG tablet      ketoconazole (NIZORAL) 2 % shampoo WASH 3 TIMES A WEEK. LATHER, LET SIT FOR 5 MINUTES AND RINSE 120 mL PRN   levocetirizine (XYZAL) 5 MG tablet      LORazepam (ATIVAN) 0.5 MG tablet      Melatonin 10 MG CAPS Take 1 capsule by mouth every evening.     metoprolol succinate (TOPROL-XL) 25 MG 24 hr tablet Take 1 tablet (25 mg total) by mouth at bedtime. For Blood pressure 90 tablet 1   Multiple Vitamin (MULTIVITAMIN) capsule Take 1 capsule by mouth daily.     Multiple Vitamins-Minerals (PRESERVISION AREDS 2 PO) Take 1 capsule by mouth daily.     OXYGEN Inhale 2 L/min into the lungs at bedtime. O2 concentrator     potassium chloride (KLOR-CON) 10 MEQ tablet      RESTASIS 0.05 % ophthalmic emulsion      traZODone (DESYREL) 100 MG tablet      No facility-administered medications prior to visit.    PAST MEDICAL HISTORY: Past Medical History:  Diagnosis Date   Anxiety and depression    Arthritis    Chronic fatigue syndrome    Diabetes mellitus    DVT (deep venous thrombosis) (HCC)    s/p knee replacmet.    Fibromyalgia    GERD (gastroesophageal reflux disease)    Hypercholesteremia    Hypertension    Major depressive disorder 03/20/2012   Obesity    Ovarian cancer (HCC)    Peripheral edema    Sinusitis July 2013   Sleep apnea    O2 concentrator at night    PAST SURGICAL HISTORY: Past Surgical History:  Procedure Laterality Date   ABDOMINAL HYSTERECTOMY     CATARACT EXTRACTION W/ INTRAOCULAR LENS & ANTERIOR VITRECTOMY Left 11/02/26   CATARACT EXTRACTION W/ INTRAOCULAR LENS  IMPLANT Right 09/28/2016   LUMBAR DISC SURGERY     x 2   ORIF ANKLE FRACTURE Right 11/13/2018   [facility]   REPLACEMENT TOTAL KNEE Bilateral    2 on left and 1 on right, Dr Lequita Halt   ROTATOR CUFF REPAIR Right    TONSILLECTOMY     VEIN LIGATION AND STRIPPING      FAMILY HISTORY: Family History  Problem Relation Age of Onset   Hypertension Mother    Atrial fibrillation Mother    Diabetes Mother    Hyperlipidemia Mother    Diabetes type II  Father    Lung cancer Father    AAA (abdominal aortic aneurysm) Father    Depression Daughter    Diabetes Daughter    Diabetes Son    Depression Son    Depression Son    Depression Son    Colon cancer Neg Hx     SOCIAL HISTORY: Social History   Socioeconomic History   Marital status: Widowed    Spouse name: Not on file   Number of children: 5   Years of education: 48   Highest education level: GED or equivalent  Occupational History   Occupation: retired  Tobacco Use   Smoking status: Never   Smokeless tobacco: Never  Vaping Use   Vaping status: Never Used  Substance and Sexual Activity   Alcohol use: Not Currently   Drug use: No   Sexual activity: Not Currently  Other Topics Concern   Not on file  Social History Narrative   Lives in [place]. She enjoys watching t.v. in her free time.   Social Determinants of Health   Financial Resource Strain: Low Risk  (06/28/2021)   Overall Financial Resource Strain (CARDIA)    Difficulty of Paying Living Expenses: Not hard at all  Food Insecurity: No Food Insecurity (11/04/2021)   Received from [facility], [facility]   Hunger Vital Sign    Worried About Running Out of Food in the Last Year: Never true    Ran Out of Food in the Last Year: Never true  Transportation Needs: No Transportation Needs (06/28/2021)   PRAPARE - Administrator, Civil Service (Medical): No    Lack of Transportation (Non-Medical): No  Physical Activity: Inactive (06/28/2021)    Exercise Vital Sign    Days of Exercise per Week: 0 days    Minutes of Exercise per Session: 0 min  Stress: Stress Concern Present (06/28/2021)   [employer] of Occupational Health - Occupational Stress Questionnaire    Feeling of Stress : To some extent  Social Connections: Unknown (02/18/2022)   Received from [facility], [facility]   Social Network    Social Network: Not on file  Intimate Partner Violence: Unknown (01/12/2022)   Received from [facility], [facility]   HITS    Physically Hurt: Not on file    Insult or Talk Down To: Not on file    Threaten Physical Harm: Not on file    Scream or Curse: Not on file     PHYSICAL EXAM  GENERAL EXAM/CONSTITUTIONAL: Vitals:  Vitals:   07/06/23 0956  BP: (!) 140/70  Pulse: 70   There is no height or weight on file to calculate BMI. Wt Readings from Last 3 Encounters:  05/15/23 266 lb (120.7 kg)  05/28/22 260 lb 2.3 oz (118 kg)  04/06/22 260 lb (117.9 kg)   NEUROLOGIC: MENTAL STATUS:      07/06/2023   10:06 AM 12/28/2021    2:40 PM  Montreal Cognitive Assessment   Visuospatial/ Executive (0/5) 4 0  Naming (0/3) 3 3  Attention: Read list of digits (0/2) 2 1  Attention: Read list of letters (0/1) 1 1  Attention: Serial 7 subtraction starting at 100 (0/3) 0 3  Language: Repeat phrase (0/2) 2 2  Language : Fluency (0/1) 1 0  Abstraction (0/2) 2 2  Delayed Recall (0/5) 1 2  Orientation (0/6) 6 6  Total 22 20  Adjusted Score (based on education) 23    CRANIAL NERVE:  2nd, 3rd,  4th, 6th - pupils equal and reactive to light, visual fields full to confrontation, extraocular muscles intact, no nystagmus 5th - facial sensation symmetric 7th - facial strength symmetric 8th - hearing intact 9th - palate elevates symmetrically, uvula midline 11th - shoulder shrug symmetric 12th - tongue protrusion midline  MOTOR:  5/5 strength bilateral upper extremities, 3/5 RLE, 3+/5 LLE. She is unable to stand without  assistance. Mildly increased tone in left upper extremity.   SENSORY:  Decreased sensation to light touch over left pinky, otherwise sensation intact to light touch throughout  COORDINATION:  Finger tapping normal, slowed toe tapping on left. Mild action tremor left>right, no resting tremor observed. One incidence of brief, large amplitude jerking in bilateral upper extremities (1-2 jerks, then stops).  REFLEXES:  Brisk upper extremity reflexes, 2+ bilateral lower extremity reflexes  GAIT/STATION:  Deferred, patient in wheelchair today     DIAGNOSTIC DATA (LABS, IMAGING, TESTING) - I reviewed patient records, labs, notes, testing and imaging myself where available.  Lab Results  Component Value Date   WBC 12.6 (H) 04/06/2022   HGB 12.2 04/06/2022   HCT 38.0 04/06/2022   MCV 93.4 04/06/2022   PLT 189 04/06/2022      Component Value Date/Time   NA 144 05/28/2022 0347   NA 141 11/13/2018 0000   K 4.1 05/28/2022 0347   CL 108 05/28/2022 0347   CO2 29 05/28/2022 0347   GLUCOSE 103 (H) 05/28/2022 0347   BUN 27 (H) 05/28/2022 0347   BUN 28 (A) 11/13/2018 0000   CREATININE 1.59 (H) 05/28/2022 0347   CREATININE 2.56 (H) 08/26/2021 0000   CALCIUM 8.3 (L) 05/28/2022 0347   PROT 6.3 (L) 03/28/2022 0013   ALBUMIN 3.6 03/28/2022 0013   AST 10 (L) 03/28/2022 0013   ALT 15 03/28/2022 0013   ALKPHOS 79 03/28/2022 0013   BILITOT 0.6 03/28/2022 0013   GFRNONAA 32 (L) 05/28/2022 0347   GFRNONAA 31 (L) 03/22/2021 0000   GFRAA 35 (L) 03/22/2021 0000   Lab Results  Component Value Date   CHOL 148 08/26/2021   HDL 53 08/26/2021   LDLCALC 72 08/26/2021   LDLDIRECT 124.0 04/16/2013   TRIG 151 (H) 08/26/2021   CHOLHDL 2.8 08/26/2021   Lab Results  Component Value Date   HGBA1C 5.7 (A) 06/22/2021   Lab Results  Component Value Date   VITAMINB12 221 11/04/2021   Lab Results  Component Value Date   TSH 3.20 08/26/2021    ASSESSMENT AND PLAN  82 y.o. female with a history  of HTN, HLD, diabetes, lumbar spondylosis s/p disc surgery, depression who presents for evaluation of jerking episodes. Only one brief episode is observed today. Will order EEG to rule out seizure activity/assess for cortical myoclonus. Will also check blood work for underlying medical causes. Blood work in June was significant for elevated BUN (51), and she has previously had elevated LFTs on blood work. She is on multiple medications that have been shown to cause myoclonus including trazodone, wellbutrin, amlodipine, and donepezil. However she has been on most of these long term and only noticed the jerking recently. May consider decreasing donepezil dose if no other cause found, although she has been on this prior to the worsening of her symptoms and she does appear to benefit from it.  Her memory is stable today (MOCA 23). However she reports she is continuing to have visual hallucinations (people) despite taking her medications as prescribed. Exam with mildly increased tone in LUE, more prominent  today than on her last exam. She has slowed toe tapping on the left, though of note she has a history of spinal stenosis and left>right leg weakness. Constellation of symptoms and worsening jerking raises concern for a neurodegenerative process such as Lewy Body Dementia. Will refer for neuropsychological testing and may consider DAT scan in the future.   1. Myoclonus   2. Jerking   3. Memory loss   4. Hallucinations       PLAN: -CBC, CMP, ammonia level -routine EEG -Continue donepezil 10 mg daily for now. If no other cause for jerking found may consider decreasing dose or stopping the medication -Referral for neuropsychological testing -Consider DAT scan  Orders Placed This Encounter  Procedures   CBC with Differential/Platelets   CMP   Ammonia   Ammonia   Ambulatory referral to Neuropsychology   EEG adult    Return in about 6 months (around 01/03/2024).    Ocie Doyne,  MD 07/06/23 12:03 PM   I spent an average of 68 minutes chart reviewing and counseling the patient, with at least 50% of the time face to face with the patient.   [facility] [address] [place], [place] [address] [phone]

## 2023-07-06 ENCOUNTER — Encounter: Payer: Self-pay | Admitting: Psychiatry

## 2023-07-06 ENCOUNTER — Ambulatory Visit (INDEPENDENT_AMBULATORY_CARE_PROVIDER_SITE_OTHER): Payer: Medicare PPO | Admitting: Psychiatry

## 2023-07-06 VITALS — BP 140/70 | HR 70

## 2023-07-06 DIAGNOSIS — R413 Other amnesia: Secondary | ICD-10-CM | POA: Diagnosis not present

## 2023-07-06 DIAGNOSIS — G253 Myoclonus: Secondary | ICD-10-CM

## 2023-07-06 DIAGNOSIS — R253 Fasciculation: Secondary | ICD-10-CM

## 2023-07-06 DIAGNOSIS — R443 Hallucinations, unspecified: Secondary | ICD-10-CM | POA: Diagnosis not present

## 2023-07-07 LAB — CBC WITH DIFFERENTIAL/PLATELET
Basophils Absolute: 0.1 10*3/uL (ref 0.0–0.2)
Basos: 1 %
EOS (ABSOLUTE): 0.1 10*3/uL (ref 0.0–0.4)
Eos: 1 %
Hematocrit: 42.6 % (ref 34.0–46.6)
Hemoglobin: 13.1 g/dL (ref 11.1–15.9)
Immature Grans (Abs): 0 10*3/uL (ref 0.0–0.1)
Immature Granulocytes: 0 %
Lymphocytes Absolute: 2.4 10*3/uL (ref 0.7–3.1)
Lymphs: 24 %
MCH: 29.2 pg (ref 26.6–33.0)
MCHC: 30.8 g/dL — ABNORMAL LOW (ref 31.5–35.7)
MCV: 95 fL (ref 79–97)
Monocytes Absolute: 0.6 10*3/uL (ref 0.1–0.9)
Monocytes: 6 %
Neutrophils Absolute: 6.6 10*3/uL (ref 1.4–7.0)
Neutrophils: 68 %
Platelets: 260 10*3/uL (ref 150–450)
RBC: 4.48 x10E6/uL (ref 3.77–5.28)
RDW: 12.6 % (ref 11.7–15.4)
WBC: 9.8 10*3/uL (ref 3.4–10.8)

## 2023-07-07 LAB — COMPREHENSIVE METABOLIC PANEL
ALT: 12 [IU]/L (ref 0–32)
AST: 14 [IU]/L (ref 0–40)
Albumin: 3.9 g/dL (ref 3.7–4.7)
Alkaline Phosphatase: 93 [IU]/L (ref 44–121)
BUN/Creatinine Ratio: 20 (ref 12–28)
BUN: 41 mg/dL — ABNORMAL HIGH (ref 8–27)
Bilirubin Total: 0.4 mg/dL (ref 0.0–1.2)
CO2: 27 mmol/L (ref 20–29)
Calcium: 9.4 mg/dL (ref 8.7–10.3)
Chloride: 98 mmol/L (ref 96–106)
Creatinine, Ser: 2.06 mg/dL — ABNORMAL HIGH (ref 0.57–1.00)
Globulin, Total: 2.5 g/dL (ref 1.5–4.5)
Glucose: 176 mg/dL — ABNORMAL HIGH (ref 70–99)
Potassium: 4.6 mmol/L (ref 3.5–5.2)
Sodium: 141 mmol/L (ref 134–144)
Total Protein: 6.4 g/dL (ref 6.0–8.5)
eGFR: 24 mL/min/{1.73_m2} — ABNORMAL LOW (ref >59)

## 2023-07-07 LAB — AMMONIA: Ammonia: 43 ug/dL (ref 28–135)

## 2023-07-10 ENCOUNTER — Telehealth: Payer: Self-pay | Admitting: Psychiatry

## 2023-07-10 ENCOUNTER — Telehealth: Payer: Self-pay

## 2023-07-10 NOTE — Telephone Encounter (Signed)
-----   Message from Blaine Chima sent at 07/10/2023  8:36 AM EDT ----- Creatinine is high (2.06). It has been this high before (one year ago), but it is higher than it was on her most recent blood work. I recommend she follow up with her PCP for her kidney function

## 2023-07-10 NOTE — Telephone Encounter (Signed)
Referral for neuropsychology fax to [facility]. Phone:[phone], Fax: [phone]

## 2023-07-10 NOTE — Telephone Encounter (Signed)
I called the patient and relayed the results of the blood work. She verbalized understanding and expressed appreciation for the call. The findings were explained to her in detail. She stated Carolyn Lambert was no longer her PCP. She now resides at [facility] in [place]. She would like her lab results to be sent to the facility. I have faxed the lab results to Fax #: [phone].

## 2023-08-01 ENCOUNTER — Ambulatory Visit (INDEPENDENT_AMBULATORY_CARE_PROVIDER_SITE_OTHER): Payer: Medicare PPO | Admitting: Neurology

## 2023-08-01 DIAGNOSIS — R253 Fasciculation: Secondary | ICD-10-CM

## 2023-08-01 NOTE — Telephone Encounter (Signed)
Can we follow up with facility to see if patient has continued to have rectal bleeding and if so ask her if she would like to do a colonoscopy.  Brooke Bonito, MSN, APRN, FNP-BC, AGACNP-BC [facility] at [facility]

## 2023-08-01 NOTE — Procedures (Signed)
   HISTORY: 82 years old female presenting with intermittent body jerking movement, abnormal kidney function, GFR of 24, on polypharmacy treatment, including Wellbutrin 150 mg daily, Cymbalta 60 mg daily, lorazepam as needed,  TECHNIQUE:  This is a routine 16 channel EEG recording with one channel devoted to a limited EKG recording.  It was performed during wakefulness, drowsiness and asleep.  Photic stimulation were performed as activating procedures.  There are minimum muscle and movement artifact noted.  Upon maximum arousal, posterior dominant waking rhythm consistent of mildly dysrhythmic theta range activity. Activities are symmetric over the bilateral posterior derivations and attenuated with eye opening.  Photic stimulation did not alter the tracing.  Hyperventilation was not performed  During EEG recording, patient developed drowsiness and no deeper stage of sleep was achieved  During EEG recording, there was no epileptiform discharge noted.  EKG demonstrate normal sinus rhythm.  CONCLUSION: This is a mild abnormal EEG.  There is mild background slowing, indicating mild bihemispheric malfunction.  There was no epileptiform discharge.  Levert Feinstein, M.D. Ph.D.  [facility] [address] [place], [place] [address] Phone: [phone] Fax:      [phone]

## 2023-08-01 NOTE — Telephone Encounter (Signed)
Spoke with April at [facility]. She states she has not had any more reports of pt having any rectal bleeding.

## 2023-08-03 ENCOUNTER — Telehealth: Payer: Self-pay | Admitting: *Deleted

## 2023-08-03 NOTE — Telephone Encounter (Signed)
LVM for son to call office

## 2023-08-03 NOTE — Telephone Encounter (Signed)
Called son, [name] at [phone]. LVM for him to call about results.

## 2023-08-03 NOTE — Telephone Encounter (Signed)
Pt's son is asking for a call back in response to a vm he received.

## 2023-08-03 NOTE — Telephone Encounter (Signed)
Took call from phone room and spoke with son, [name]. I relayed results, he verbalized understanding. He travels for work and currently in [facility]. Asked that I called grandson (POA), Blair Dolphin at [phone]. Called Gerilyn Pilgrim. Relayed results. He verbalized understanding. Was not aware of appt 07/06/23 with Dr. Delena Bali or that she had EEG this week. He is going to contact [facility] rehab to get update on mother. I called back and LVM  giving him phone number to call and schedule for her to see neuropsychology [phone]. Also asked he fax copy of POA ppw to [name] at [phone].

## 2023-08-03 NOTE — Telephone Encounter (Signed)
Per Dr. Terrace Arabia: "Slightly slowed EEG can be seen in aging brains, and can be related to the medications that were listed- beware of polypharmacy effect on cognition.  No epileptiform activity was seen.  The patient didn't fall asleep. In Agree with the referral for neuropsychology testing, consider care transfer to gerontology. "  I took call from Lisa/phone room and spoke w/ pt. Relayed results, she verbalized understanding. She could not tell me if she has heard about scheduling appt with neuropsychology. She is aware we reached out to son to go over results as well.

## 2023-09-29 ENCOUNTER — Other Ambulatory Visit (HOSPITAL_COMMUNITY): Payer: Self-pay | Admitting: Nephrology

## 2023-09-29 ENCOUNTER — Other Ambulatory Visit (HOSPITAL_COMMUNITY): Payer: Self-pay | Admitting: Internal Medicine

## 2023-09-29 ENCOUNTER — Other Ambulatory Visit (HOSPITAL_BASED_OUTPATIENT_CLINIC_OR_DEPARTMENT_OTHER): Payer: Self-pay | Admitting: Internal Medicine

## 2023-09-29 DIAGNOSIS — R109 Unspecified abdominal pain: Secondary | ICD-10-CM

## 2023-09-29 DIAGNOSIS — N029 Recurrent and persistent hematuria with unspecified morphologic changes: Secondary | ICD-10-CM

## 2023-09-29 DIAGNOSIS — N189 Chronic kidney disease, unspecified: Secondary | ICD-10-CM

## 2023-12-21 ENCOUNTER — Other Ambulatory Visit (HOSPITAL_COMMUNITY): Payer: Self-pay | Admitting: Nephrology

## 2023-12-21 DIAGNOSIS — N1831 Chronic kidney disease, stage 3a: Secondary | ICD-10-CM

## 2023-12-21 DIAGNOSIS — N189 Chronic kidney disease, unspecified: Secondary | ICD-10-CM

## 2024-01-08 ENCOUNTER — Ambulatory Visit: Payer: Medicare PPO | Admitting: Neurology

## 2024-01-19 ENCOUNTER — Ambulatory Visit (HOSPITAL_COMMUNITY)
Admission: RE | Admit: 2024-01-19 | Discharge: 2024-01-19 | Disposition: A | Discharge status: Home / Self Care | Source: Ambulatory Visit | Attending: Nephrology | Admitting: Nephrology

## 2024-01-19 DIAGNOSIS — N1831 Chronic kidney disease, stage 3a: Secondary | ICD-10-CM | POA: Diagnosis present

## 2024-01-19 DIAGNOSIS — D631 Anemia in chronic kidney disease: Secondary | ICD-10-CM | POA: Diagnosis present

## 2024-01-19 DIAGNOSIS — N189 Chronic kidney disease, unspecified: Secondary | ICD-10-CM | POA: Insufficient documentation

## 2024-03-21 ENCOUNTER — Ambulatory Visit: Admitting: Neurology

## 2024-06-18 ENCOUNTER — Ambulatory Visit: Admitting: Neurology

## 2024-06-18 ENCOUNTER — Encounter: Payer: Self-pay | Admitting: Neurology

## 2024-09-05 ENCOUNTER — Emergency Department (HOSPITAL_COMMUNITY)

## 2024-09-05 ENCOUNTER — Emergency Department (HOSPITAL_COMMUNITY)
Admission: EM | Admit: 2024-09-05 | Discharge: 2024-09-06 | Disposition: A | Discharge status: Home / Self Care | Attending: Emergency Medicine | Admitting: Emergency Medicine

## 2024-09-05 DIAGNOSIS — M549 Dorsalgia, unspecified: Secondary | ICD-10-CM | POA: Diagnosis not present

## 2024-09-05 DIAGNOSIS — Z7982 Long term (current) use of aspirin: Secondary | ICD-10-CM | POA: Insufficient documentation

## 2024-09-05 DIAGNOSIS — M7989 Other specified soft tissue disorders: Secondary | ICD-10-CM | POA: Insufficient documentation

## 2024-09-05 DIAGNOSIS — S8992XA Unspecified injury of left lower leg, initial encounter: Secondary | ICD-10-CM | POA: Diagnosis present

## 2024-09-05 DIAGNOSIS — W19XXXA Unspecified fall, initial encounter: Secondary | ICD-10-CM | POA: Insufficient documentation

## 2024-09-05 DIAGNOSIS — F0390 Unspecified dementia without behavioral disturbance: Secondary | ICD-10-CM | POA: Insufficient documentation

## 2024-09-05 DIAGNOSIS — S82832A Other fracture of upper and lower end of left fibula, initial encounter for closed fracture: Secondary | ICD-10-CM | POA: Insufficient documentation

## 2024-09-05 DIAGNOSIS — Y92129 Unspecified place in nursing home as the place of occurrence of the external cause: Secondary | ICD-10-CM | POA: Insufficient documentation

## 2024-09-05 MED ORDER — NAPROXEN 500 MG PO TABS: 500.0000 mg | ORAL_TABLET | Freq: Two times a day (BID) | ORAL | 0 refills | Status: AC

## 2024-09-05 MED ORDER — HYDROMORPHONE HCL 1 MG/ML IJ SOLN
0.5000 mg | INTRAMUSCULAR | Status: AC
  Administered 2024-09-05: 0.5 mg via INTRAMUSCULAR
  Filled 2024-09-05: qty 0.5

## 2024-09-05 NOTE — ED Provider Notes (Signed)
 [place] EMERGENCY DEPARTMENT AT [facility] Provider Note   CSN: [id number] Arrival date & time: 09/05/24  2033     Patient presents with: Carolyn Lambert is a 83 y.o. female.    Fall   This patient is an 83 year old female, she is living in a nursing facility, she evidently had a slip from her commode to the floor earlier today, she is from [facility], this is occurred this morning at around 10:00, the patient does have some dementia but is alert at baseline.  Family members came to visit her this evening and requested that she be transported to the hospital for evaluation of her bilateral hip and back pain.  She has not been able to ambulate after the fall.  The patient is able to tell me what time it happened, she can tell me that she was on the commode and slipped to the floor, she denies hitting her head    Prior to Admission medications   Medication Sig Start Date End Date Taking? Authorizing Provider  naproxen  (NAPROSYN ) 500 MG tablet Take 1 tablet (500 mg total) by mouth 2 (two) times daily with a meal. 09/05/24  Yes Cleotilde Rogue, MD  acetaminophen  (TYLENOL ) 500 MG tablet Take 500 mg by mouth 2 (two) times daily.    [provider]  alendronate  (FOSAMAX ) 70 MG tablet TAKE 1 TABLET(70 MG) BY MOUTH 1 TIME A WEEK WITH A FULL GLASS OF WATER AND ON AN EMPTY STOMACH 11/10/21   Alvan Dorothyann BIRCH, MD  amLODipine  (NORVASC ) 10 MG tablet Take 1 tablet (10 mg total) by mouth daily. Patient taking differently: Take 10 mg by mouth every evening. 11/10/21   Alvan Dorothyann BIRCH, MD  aspirin 81 MG EC tablet Take by mouth.    [provider]  atorvastatin  (LIPITOR) 20 MG tablet Take 1 tablet (20 mg total) by mouth daily. 11/10/21   Alvan Dorothyann BIRCH, MD  baclofen  (LIORESAL ) 10 MG tablet Take 1 tablet (10 mg total) by mouth 2 (two) times daily as needed for muscle spasms. TAKE 1 TABLET BY MOUTH 2  TIMES DAILY AS DIRECTED Strength: 10 mg 08/26/21    Alvan Dorothyann BIRCH, MD  buPROPion  (WELLBUTRIN  XL) 150 MG 24 hr tablet TAKE 1 TABLET BY MOUTH  TWICE DAILY AT 8AM AND 3PM Patient taking differently: Take 300 mg by mouth daily. 11/10/21   Alvan Dorothyann BIRCH, MD  Cholecalciferol (VITAMIN D3) 50 MCG (2000 UT) capsule Take 2,000 Units by mouth daily.    [provider]  clobetasol (TEMOVATE) 0.05 % external solution Apply topically. 09/08/21   [provider]  donepezil  (ARICEPT ) 5 MG tablet Take 1 tablet (5 mg total) by mouth at bedtime. Take 1 pill daily for 4 weeks, then increase to 2 pills daily 12/28/21   Rush Nest, MD  DULoxetine  (CYMBALTA ) 60 MG capsule Take 1 capsule (60 mg total) by mouth daily. 11/10/21   Alvan Dorothyann BIRCH, MD  furosemide  (LASIX ) 20 MG tablet TAKE ONE TABLET BY MOUTH DAILY AS NEEDED FOR SWELLING 11/10/21   Alvan Dorothyann BIRCH, MD  glipiZIDE (GLUCOTROL XL) 10 MG 24 hr tablet Take 10 mg by mouth daily. 12/14/21   [provider]  hydrochlorothiazide (HYDRODIURIL) 25 MG tablet Take 25 mg by mouth daily. 12/14/21   [provider]  HYDROcodone -acetaminophen  (NORCO/VICODIN) 5-325 MG tablet  05/02/23   [provider]  ketoconazole  (NIZORAL ) 2 % shampoo WASH 3 TIMES A WEEK. LATHER, LET SIT FOR 5  MINUTES AND RINSE 12/31/21   Alvan Dorothyann BIRCH, MD  levocetirizine (XYZAL) 5 MG tablet  04/03/23   [provider]  LORazepam  (ATIVAN ) 0.5 MG tablet  05/02/23   [provider]  Melatonin 10 MG CAPS Take 1 capsule by mouth every evening.    [provider]  metoprolol  succinate (TOPROL -XL) 25 MG 24 hr tablet Take 1 tablet (25 mg total) by mouth at bedtime. For Blood pressure 11/10/21   Alvan Dorothyann BIRCH, MD  Multiple Vitamin (MULTIVITAMIN) capsule Take 1 capsule by mouth daily.    [provider]  Multiple Vitamins-Minerals (PRESERVISION AREDS 2 PO) Take 1 capsule by mouth daily.    [provider]  OXYGEN  Inhale 2 L/min into the lungs at  bedtime. O2 concentrator    [provider]  potassium chloride (KLOR-CON) 10 MEQ tablet  04/26/23   [provider]  RESTASIS 0.05 % ophthalmic emulsion  06/19/20   Geroge Iha, OD  traZODone  (DESYREL ) 100 MG tablet  04/12/23   [provider]    Allergies: Losartan , Gabapentin , Lisinopril , Oxycodone , and Oxycodone -aspirin    Review of Systems  All other systems reviewed and are negative.   Updated Vital Signs BP 107/79 (BP Location: Left Arm)   Pulse 61   Temp 97.9 F (36.6 C) (Oral)   Resp 18   Ht 1.702 m (5' 7)   Wt 120.7 kg   SpO2 99%   BMI 41.68 kg/m   Physical Exam Vitals and nursing note reviewed.  Constitutional:      General: She is not in acute distress.    Appearance: She is well-developed.  HENT:     Head: Normocephalic and atraumatic.     Mouth/Throat:     Pharynx: No oropharyngeal exudate.  Eyes:     General: No scleral icterus.       Right eye: No discharge.        Left eye: No discharge.     Conjunctiva/sclera: Conjunctivae normal.     Pupils: Pupils are equal, round, and reactive to light.  Neck:     Thyroid : No thyromegaly.     Vascular: No JVD.  Cardiovascular:     Rate and Rhythm: Normal rate and regular rhythm.     Heart sounds: Normal heart sounds. No murmur heard.    No friction rub. No gallop.  Pulmonary:     Effort: Pulmonary effort is normal. No respiratory distress.     Breath sounds: Normal breath sounds. No wheezing or rales.  Abdominal:     General: Bowel sounds are normal. There is no distension.     Palpations: Abdomen is soft. There is no mass.     Tenderness: There is no abdominal tenderness.  Musculoskeletal:        General: Tenderness present.     Cervical back: Normal range of motion and neck supple.     Right lower leg: No edema.     Left lower leg: No edema.     Comments: The patient has tenderness with any range of motion of the bilateral lower extremities at the hips, she has pain with  bending her knees not at the knees but at the hips.  She has totally normal sensation of the bilateral lower extremities, tenderness over the lumbar back.  Lymphadenopathy:     Cervical: No cervical adenopathy.  Skin:    General: Skin is warm and dry.     Findings: No erythema or rash.  Neurological:  Mental Status: She is alert.     Coordination: Coordination normal.  Psychiatric:        Behavior: Behavior normal.     (all labs ordered are listed, but only abnormal results are displayed) Labs Reviewed - No data to display  EKG: None  Radiology: DG Knee Complete 4 Views Right Result Date: 09/05/2024 EXAM: 4 OR MORE VIEW(S) XRAY OF THE RIGHT KNEE 09/05/2024 10:14:11 PM COMPARISON: None available. CLINICAL HISTORY: trauma FINDINGS: BONES AND JOINTS: Prior right knee replacement. No acute fracture. No focal osseous lesion. No joint dislocation. No significant joint effusion. SOFT TISSUES: The soft tissues are unremarkable. IMPRESSION: 1. No acute osseous abnormality. Electronically signed by: Franky Crease MD 09/05/2024 10:19 PM EST RP Workstation: [id number]   DG Knee Complete 4 Views Left Result Date: 09/05/2024 EXAM: 4 OR MORE VIEW(S) XRAY OF THE LEFT KNEE 09/05/2024 10:14:11 PM COMPARISON: None available. CLINICAL HISTORY: trauma FINDINGS: BONES AND JOINTS: Prior left knee replacement. There is an oblique fracture through the proximal shaft of the left fibula, minimally displaced. No focal osseous lesion. No joint dislocation. No significant joint effusion. SOFT TISSUES: The soft tissues are unremarkable. IMPRESSION: 1. Oblique fracture through the proximal shaft of the left fibula, minimally displaced. Electronically signed by: Franky Crease MD 09/05/2024 10:18 PM EST RP Workstation: [id number]   DG Hips Bilat W or Wo Pelvis 3-4 Views Result Date: 09/05/2024 CLINICAL DATA:  Trauma, fell EXAM: DG HIP (WITH OR WITHOUT PELVIS) 3-4V BILAT COMPARISON:  None Available. FINDINGS: Frontal  view of the pelvis as well as frontal and frogleg lateral views of both hips are obtained. No acute fracture, subluxation, or dislocation. Symmetrical bilateral hip osteoarthritis. The sacroiliac joints are unremarkable. IMPRESSION: 1. No acute displaced fracture. 2. Symmetrical bilateral hip osteoarthritis. Electronically Signed   By: Ozell Daring M.D.   On: 09/05/2024 21:47   DG Lumbar Spine Complete Result Date: 09/05/2024 CLINICAL DATA:  Trauma, fell EXAM: LUMBAR SPINE - COMPLETE 4+ VIEW COMPARISON:  03/01/2019 FINDINGS: Frontal, bilateral oblique, lateral views of the lumbar spine are obtained on 5 images. There are 5 non-rib-bearing lumbar type vertebral bodies identified in grossly normal anatomic alignment. No acute fractures. Multilevel spondylosis and facet hypertrophy, with disc space narrowing greatest at the T12-L1 and L1-2 levels. There is bony fusion across the L4-5 and L5-S1 disc spaces, with prominent facet hypertrophy noted at those levels. Sacroiliac joints are unremarkable. IMPRESSION: 1. No acute lumbar spine fracture. 2. Multilevel lumbar degenerative changes. Electronically Signed   By: Ozell Daring M.D.   On: 09/05/2024 21:47     Procedures   Medications Ordered in the ED  HYDROmorphone  (DILAUDID ) injection 0.5 mg (has no administration in time range)                                    Medical Decision Making Amount and/or Complexity of Data Reviewed Radiology: ordered.  Risk Prescription drug management.   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  She is able to move both legs to the point where she can flex her knees to about 15 degrees but states that it hurts both of her legs to do this.  She cannot straight leg raise infection refuses to try.  She is able to move both  upper extremities and  has no signs of head injury   Radiology Imaging: I personally viewed the images of the ordered radiographic studies and find no signs of hip fractures or pelvic fracture or lumbar spine fracture. Unfortunately there does appear to be an oblique fracture through the proximal shaft of the left fibula it is minimally displaced. I agree with the radiologist interpretation as well  Consultations: I discussed the case with Dr. Beverley with orthopedics, they recommend Ace wrap, anti-inflammatories, follow-up in 2 weeks in the office, this is a nonsurgical injury  Family is at the bedside and reports that the patient is not ambulatory at all at this point, they agree that this is a nonsurgical finding and are happy with the care including pain control and going back to the facility.     Final diagnoses:  Closed fracture of proximal end of left fibula, unspecified fracture morphology, initial encounter    ED Discharge Orders          Ordered    naproxen  (NAPROSYN ) 500 MG tablet  2 times daily with meals        09/05/24 2255               Cleotilde Rogue, MD 09/05/24 2255

## 2024-09-05 NOTE — Discharge Instructions (Signed)
 I spoke with the orthopedic surgeon.  This is a nonsurgical injury, you should use the Ace wrap as needed to help with some support, you can take an anti-inflammatory such as Naprosyn  twice a day, unfortunately this will cause some ongoing pain for the next month or 2 as it gradually heals

## 2024-09-05 NOTE — ED Triage Notes (Signed)
 Pt comes from [place] [facility] for a fall. Pt was in the bathroom when she slid down to the floor. Pt is not on blood thinners. Pt is alert but confused. Pt has dementia at baseline   EMS VS 137/113

## 2024-09-06 NOTE — ED Notes (Signed)
 EMS at bedside

## 2024-11-07 ENCOUNTER — Ambulatory Visit: Admitting: Neurology

## 2025-01-14 ENCOUNTER — Ambulatory Visit: Admitting: Neurology
